# Patient Record
Sex: Female | Born: 1965 | Race: White | Hispanic: No | Smoking: Never smoker
Health system: Southern US, Community
[De-identification: ages and names within clinical notes are randomized; demographics above are authoritative.]

## PROBLEM LIST (undated history)

## (undated) DIAGNOSIS — R519 Headache, unspecified: Secondary | ICD-10-CM

## (undated) DIAGNOSIS — R51 Headache: Secondary | ICD-10-CM

## (undated) DIAGNOSIS — E559 Vitamin D deficiency, unspecified: Secondary | ICD-10-CM

## (undated) DIAGNOSIS — D649 Anemia, unspecified: Secondary | ICD-10-CM

## (undated) DIAGNOSIS — I82409 Acute embolism and thrombosis of unspecified deep veins of unspecified lower extremity: Secondary | ICD-10-CM

## (undated) DIAGNOSIS — R87629 Unspecified abnormal cytological findings in specimens from vagina: Secondary | ICD-10-CM

## (undated) DIAGNOSIS — F329 Major depressive disorder, single episode, unspecified: Secondary | ICD-10-CM

## (undated) DIAGNOSIS — Z87442 Personal history of urinary calculi: Secondary | ICD-10-CM

## (undated) DIAGNOSIS — J45909 Unspecified asthma, uncomplicated: Secondary | ICD-10-CM

## (undated) DIAGNOSIS — F32A Depression, unspecified: Secondary | ICD-10-CM

## (undated) DIAGNOSIS — K219 Gastro-esophageal reflux disease without esophagitis: Secondary | ICD-10-CM

## (undated) HISTORY — PX: OVARY SURGERY: SHX727

## (undated) HISTORY — PX: COLONOSCOPY: SHX174

## (undated) HISTORY — PX: CHOLECYSTECTOMY: SHX55

## (undated) HISTORY — PX: KIDNEY STONE SURGERY: SHX686

## (undated) HISTORY — PX: POSTERIOR FUSION CERVICAL SPINE: SUR628

## (undated) HISTORY — PX: TONSILLECTOMY: SUR1361

## (undated) HISTORY — PX: COLPOSCOPY: SHX161

---

## 1999-01-10 ENCOUNTER — Other Ambulatory Visit: Admission: RE | Admit: 1999-01-10 | Discharge: 1999-01-10 | Payer: Self-pay | Admitting: Obstetrics and Gynecology

## 1999-01-13 ENCOUNTER — Observation Stay (HOSPITAL_COMMUNITY): Admission: AD | Admit: 1999-01-13 | Discharge: 1999-01-14 | Payer: Self-pay | Admitting: Obstetrics and Gynecology

## 1999-04-29 ENCOUNTER — Inpatient Hospital Stay (HOSPITAL_COMMUNITY): Admission: AD | Admit: 1999-04-29 | Discharge: 1999-04-29 | Payer: Self-pay | Admitting: *Deleted

## 1999-08-01 ENCOUNTER — Inpatient Hospital Stay (HOSPITAL_COMMUNITY): Admission: AD | Admit: 1999-08-01 | Discharge: 1999-08-01 | Payer: Self-pay | Admitting: Obstetrics and Gynecology

## 1999-08-08 ENCOUNTER — Inpatient Hospital Stay (HOSPITAL_COMMUNITY): Admission: AD | Admit: 1999-08-08 | Discharge: 1999-08-10 | Payer: Self-pay | Admitting: Obstetrics and Gynecology

## 1999-09-10 ENCOUNTER — Other Ambulatory Visit: Admission: RE | Admit: 1999-09-10 | Discharge: 1999-09-10 | Payer: Self-pay | Admitting: Obstetrics and Gynecology

## 1999-10-01 ENCOUNTER — Encounter: Payer: Self-pay | Admitting: Emergency Medicine

## 1999-10-01 ENCOUNTER — Emergency Department (HOSPITAL_COMMUNITY): Admission: EM | Admit: 1999-10-01 | Discharge: 1999-10-02 | Payer: Self-pay | Admitting: Emergency Medicine

## 2000-05-29 ENCOUNTER — Emergency Department (HOSPITAL_COMMUNITY): Admission: EM | Admit: 2000-05-29 | Discharge: 2000-05-29 | Payer: Self-pay | Admitting: Emergency Medicine

## 2000-08-18 ENCOUNTER — Other Ambulatory Visit (HOSPITAL_COMMUNITY): Admission: RE | Admit: 2000-08-18 | Discharge: 2000-08-20 | Payer: Self-pay | Admitting: Psychiatry

## 2000-08-20 ENCOUNTER — Inpatient Hospital Stay (HOSPITAL_COMMUNITY): Admission: EM | Admit: 2000-08-20 | Discharge: 2000-08-31 | Payer: Self-pay | Admitting: Psychiatry

## 2000-10-14 ENCOUNTER — Inpatient Hospital Stay (HOSPITAL_COMMUNITY): Admission: EM | Admit: 2000-10-14 | Discharge: 2000-10-15 | Payer: Self-pay | Admitting: *Deleted

## 2001-12-14 ENCOUNTER — Ambulatory Visit (HOSPITAL_COMMUNITY): Admission: RE | Admit: 2001-12-14 | Discharge: 2001-12-14 | Payer: Self-pay | Admitting: Obstetrics & Gynecology

## 2002-04-08 ENCOUNTER — Emergency Department (HOSPITAL_COMMUNITY): Admission: EM | Admit: 2002-04-08 | Discharge: 2002-04-08 | Payer: Self-pay | Admitting: Emergency Medicine

## 2002-07-07 ENCOUNTER — Inpatient Hospital Stay (HOSPITAL_COMMUNITY): Admission: EM | Admit: 2002-07-07 | Discharge: 2002-07-08 | Payer: Self-pay | Admitting: Psychiatry

## 2003-03-30 ENCOUNTER — Emergency Department (HOSPITAL_COMMUNITY): Admission: EM | Admit: 2003-03-30 | Discharge: 2003-03-31 | Payer: Self-pay | Admitting: Emergency Medicine

## 2003-03-30 ENCOUNTER — Encounter: Payer: Self-pay | Admitting: Emergency Medicine

## 2003-03-31 ENCOUNTER — Encounter: Payer: Self-pay | Admitting: Emergency Medicine

## 2003-05-09 ENCOUNTER — Ambulatory Visit (HOSPITAL_COMMUNITY): Admission: RE | Admit: 2003-05-09 | Discharge: 2003-05-09 | Payer: Self-pay | Admitting: *Deleted

## 2003-09-03 ENCOUNTER — Emergency Department (HOSPITAL_COMMUNITY): Admission: EM | Admit: 2003-09-03 | Discharge: 2003-09-03 | Payer: Self-pay | Admitting: Emergency Medicine

## 2003-10-16 ENCOUNTER — Emergency Department (HOSPITAL_COMMUNITY): Admission: EM | Admit: 2003-10-16 | Discharge: 2003-10-16 | Payer: Self-pay | Admitting: Emergency Medicine

## 2003-10-25 ENCOUNTER — Encounter: Admission: RE | Admit: 2003-10-25 | Discharge: 2003-10-25 | Payer: Self-pay | Admitting: Psychiatry

## 2013-01-05 DIAGNOSIS — F332 Major depressive disorder, recurrent severe without psychotic features: Secondary | ICD-10-CM

## 2013-01-05 HISTORY — DX: Major depressive disorder, recurrent severe without psychotic features: F33.2

## 2013-11-28 ENCOUNTER — Emergency Department (HOSPITAL_COMMUNITY): Payer: BC Managed Care – PPO

## 2013-11-28 ENCOUNTER — Encounter (HOSPITAL_COMMUNITY): Payer: Self-pay | Admitting: Emergency Medicine

## 2013-11-28 ENCOUNTER — Emergency Department (HOSPITAL_COMMUNITY)
Admission: EM | Admit: 2013-11-28 | Discharge: 2013-11-28 | Disposition: A | Discharge status: Home / Self Care | Payer: BC Managed Care – PPO | Attending: Emergency Medicine | Admitting: Emergency Medicine

## 2013-11-28 DIAGNOSIS — R110 Nausea: Secondary | ICD-10-CM | POA: Insufficient documentation

## 2013-11-28 DIAGNOSIS — F3289 Other specified depressive episodes: Secondary | ICD-10-CM | POA: Insufficient documentation

## 2013-11-28 DIAGNOSIS — Z87442 Personal history of urinary calculi: Secondary | ICD-10-CM | POA: Insufficient documentation

## 2013-11-28 DIAGNOSIS — R109 Unspecified abdominal pain: Secondary | ICD-10-CM | POA: Insufficient documentation

## 2013-11-28 DIAGNOSIS — R300 Dysuria: Secondary | ICD-10-CM

## 2013-11-28 DIAGNOSIS — R51 Headache: Secondary | ICD-10-CM | POA: Insufficient documentation

## 2013-11-28 DIAGNOSIS — Z3202 Encounter for pregnancy test, result negative: Secondary | ICD-10-CM | POA: Insufficient documentation

## 2013-11-28 DIAGNOSIS — R350 Frequency of micturition: Secondary | ICD-10-CM | POA: Insufficient documentation

## 2013-11-28 DIAGNOSIS — Z79899 Other long term (current) drug therapy: Secondary | ICD-10-CM | POA: Insufficient documentation

## 2013-11-28 DIAGNOSIS — F17200 Nicotine dependence, unspecified, uncomplicated: Secondary | ICD-10-CM | POA: Insufficient documentation

## 2013-11-28 DIAGNOSIS — Z880 Allergy status to penicillin: Secondary | ICD-10-CM | POA: Insufficient documentation

## 2013-11-28 DIAGNOSIS — R10A0 Flank pain, unspecified side: Secondary | ICD-10-CM

## 2013-11-28 DIAGNOSIS — R6883 Chills (without fever): Secondary | ICD-10-CM | POA: Insufficient documentation

## 2013-11-28 DIAGNOSIS — F329 Major depressive disorder, single episode, unspecified: Secondary | ICD-10-CM | POA: Insufficient documentation

## 2013-11-28 HISTORY — DX: Depression, unspecified: F32.A

## 2013-11-28 HISTORY — DX: Major depressive disorder, single episode, unspecified: F32.9

## 2013-11-28 LAB — CBC
HCT: 40.1 % (ref 36.0–46.0)
Hemoglobin: 13.4 g/dL (ref 12.0–15.0)
MCH: 30.3 pg (ref 26.0–34.0)
MCHC: 33.4 g/dL (ref 30.0–36.0)
MCV: 90.7 fL (ref 78.0–100.0)
Platelets: 439 10*3/uL — ABNORMAL HIGH (ref 150–400)
RBC: 4.42 MIL/uL (ref 3.87–5.11)
RDW: 14.1 % (ref 11.5–15.5)
WBC: 13.6 10*3/uL — ABNORMAL HIGH (ref 4.0–10.5)

## 2013-11-28 LAB — BASIC METABOLIC PANEL
BUN: 18 mg/dL (ref 6–23)
CO2: 22 mEq/L (ref 19–32)
Calcium: 9.8 mg/dL (ref 8.4–10.5)
Chloride: 101 mEq/L (ref 96–112)
Creatinine, Ser: 0.64 mg/dL (ref 0.50–1.10)
GFR calc Af Amer: >90 mL/min (ref >90)
GFR calc non Af Amer: >90 mL/min (ref >90)
Glucose, Bld: 95 mg/dL (ref 70–99)
Potassium: 4.1 mEq/L (ref 3.7–5.3)
Sodium: 137 mEq/L (ref 137–147)

## 2013-11-28 LAB — URINALYSIS, ROUTINE W REFLEX MICROSCOPIC
Bilirubin Urine: NEGATIVE
Glucose, UA: NEGATIVE mg/dL
Hgb urine dipstick: NEGATIVE
Ketones, ur: NEGATIVE mg/dL
Leukocytes, UA: NEGATIVE
Nitrite: NEGATIVE
Protein, ur: NEGATIVE mg/dL
Specific Gravity, Urine: 1.028 (ref 1.005–1.030)
Urobilinogen, UA: 0.2 mg/dL (ref 0.0–1.0)
pH: 5.5 (ref 5.0–8.0)

## 2013-11-28 LAB — HEPATIC FUNCTION PANEL
ALT: 28 U/L (ref 0–35)
AST: 29 U/L (ref 0–37)
Albumin: 3.7 g/dL (ref 3.5–5.2)
Alkaline Phosphatase: 72 U/L (ref 39–117)
Bilirubin, Direct: <0.2 mg/dL (ref 0.0–0.3)
Total Bilirubin: <0.2 mg/dL — ABNORMAL LOW (ref 0.3–1.2)
Total Protein: 7.6 g/dL (ref 6.0–8.3)

## 2013-11-28 LAB — WET PREP, GENITAL
Clue Cells Wet Prep HPF POC: NONE SEEN
Trich, Wet Prep: NONE SEEN
WBC, Wet Prep HPF POC: NONE SEEN
Yeast Wet Prep HPF POC: NONE SEEN

## 2013-11-28 LAB — POC URINE PREG, ED: Preg Test, Ur: NEGATIVE

## 2013-11-28 IMAGING — US US PELVIS COMPLETE
1 series · 13 of 25 positions shown · non-contrast
Comparison: CT earlier today

CLINICAL DATA: Right lower quadrant pain.

EXAM:
TRANSABDOMINAL AND TRANSVAGINAL ULTRASOUND OF PELVIS
DOPPLER ULTRASOUND OF OVARIES
TECHNIQUE: Both transabdominal and transvaginal ultrasound examinations of the
pelvis were performed. Transabdominal technique was performed for
global imaging of the pelvis including uterus, ovaries, adnexal
regions, and pelvic cul-de-sac.
It was necessary to proceed with endovaginal exam following the
transabdominal exam to visualize the uterus, endometrium, ovaries
and adnexa . Color and duplex Doppler ultrasound was utilized to
evaluate blood flow to the ovaries.

[Series 1: us pelvis complete · 0.27mm/px · 13 of 85 slices shown]
[im 1/85]
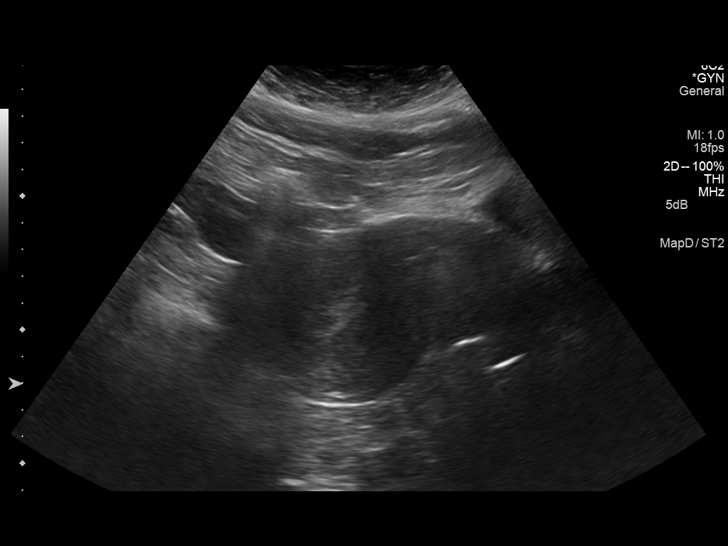
[im 8/85]
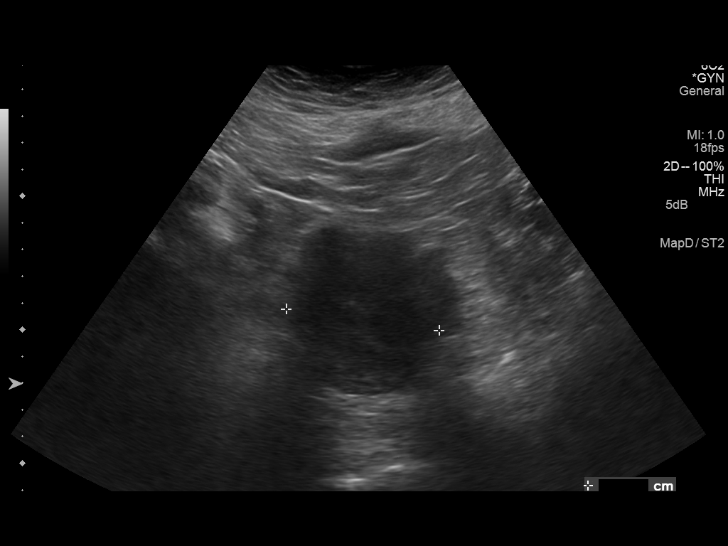
[im 15/85]
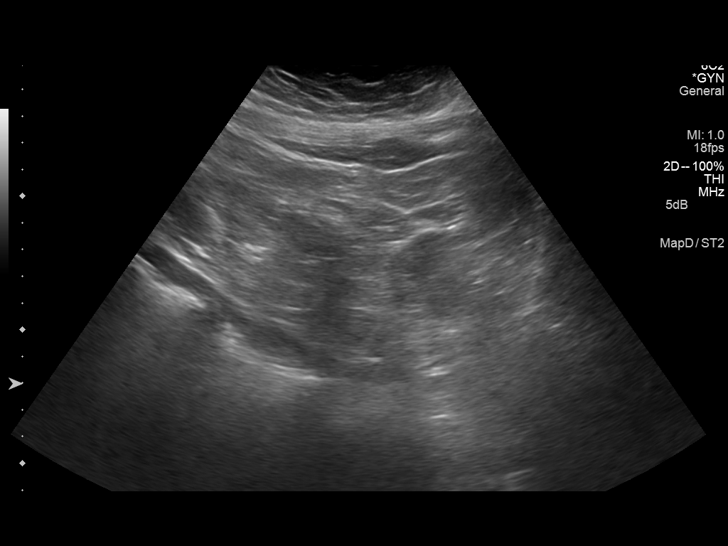
[im 22/85]
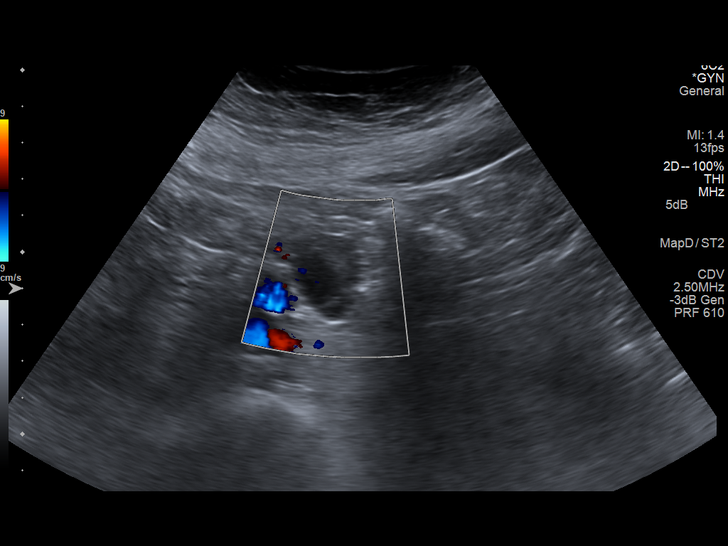
[im 29/85]
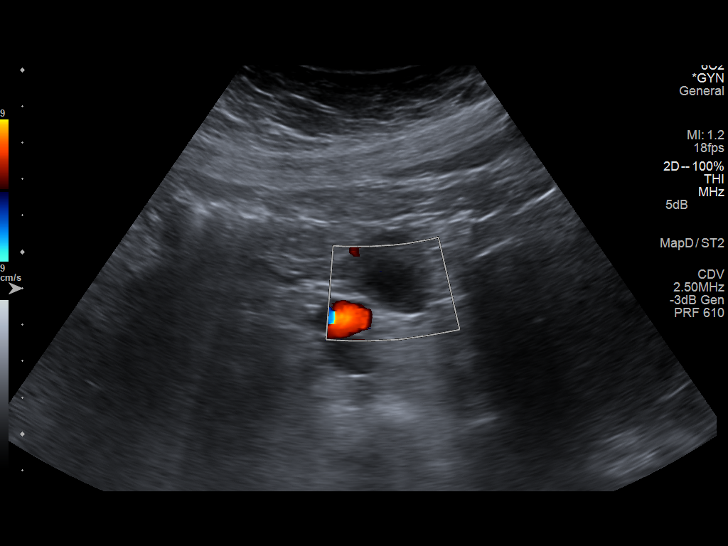
[im 36/85]
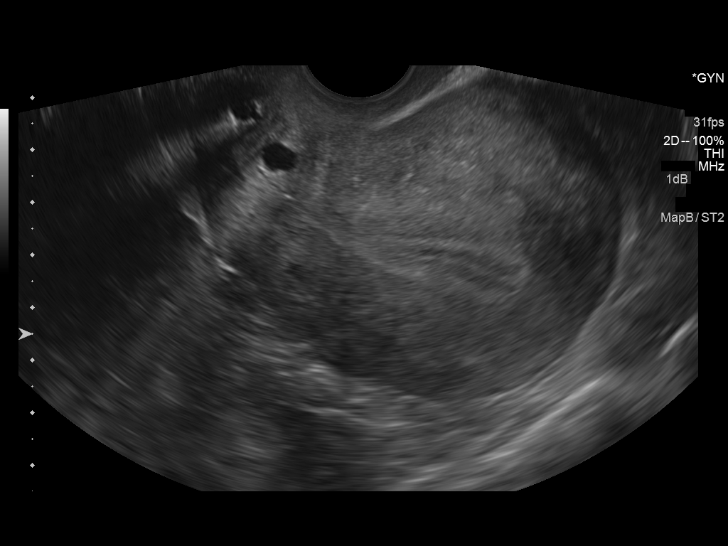
[im 43/85]
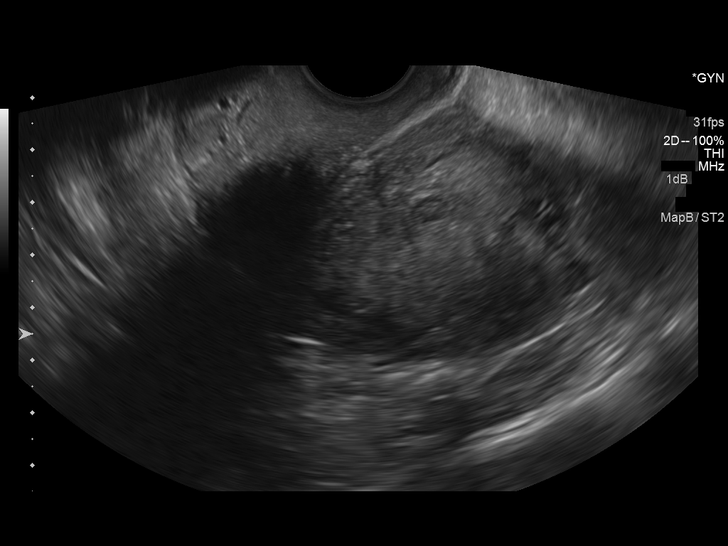
[im 50/85]
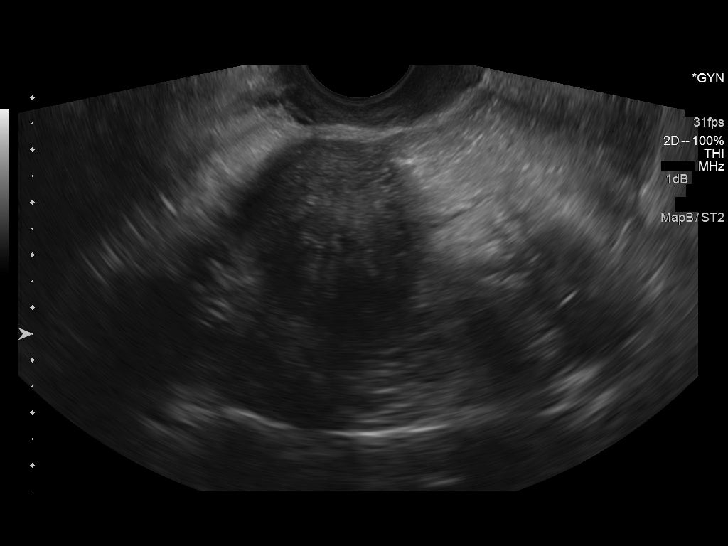
[im 57/85]
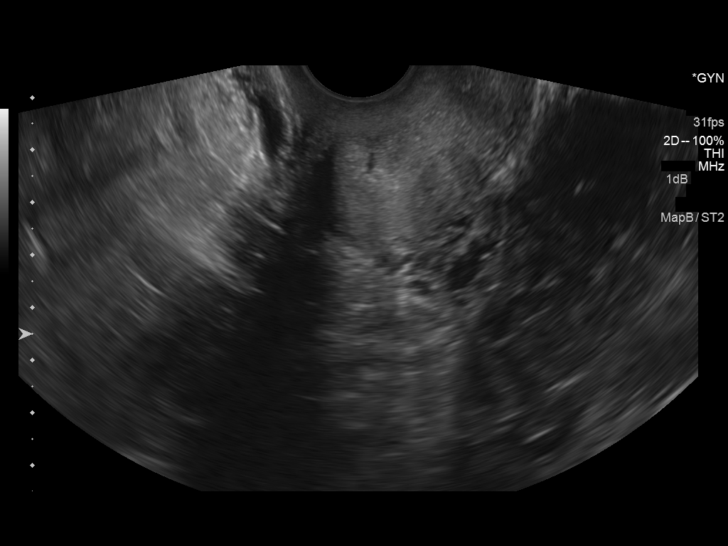
[im 64/85]
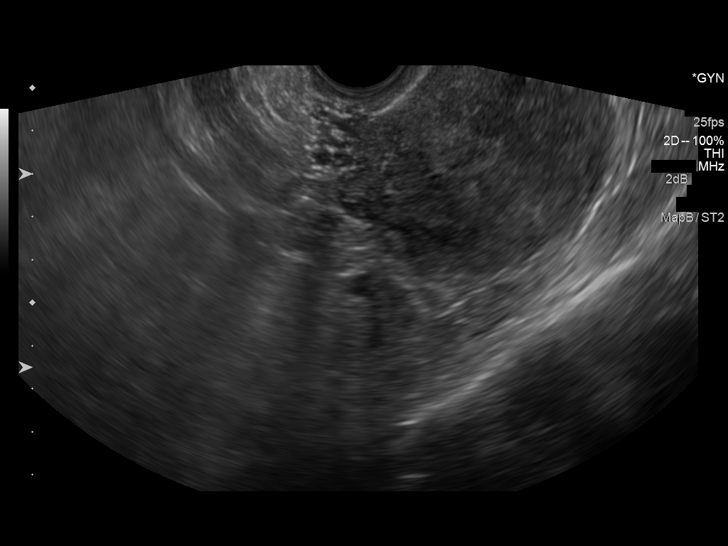
[im 71/85]
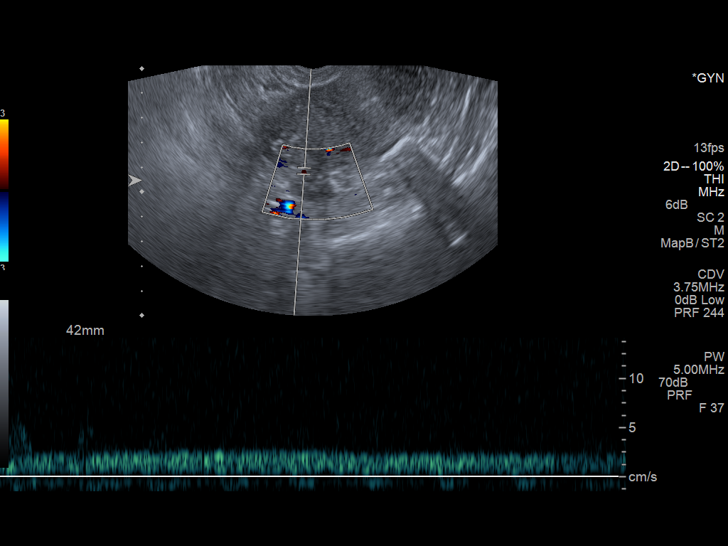
[im 78/85]
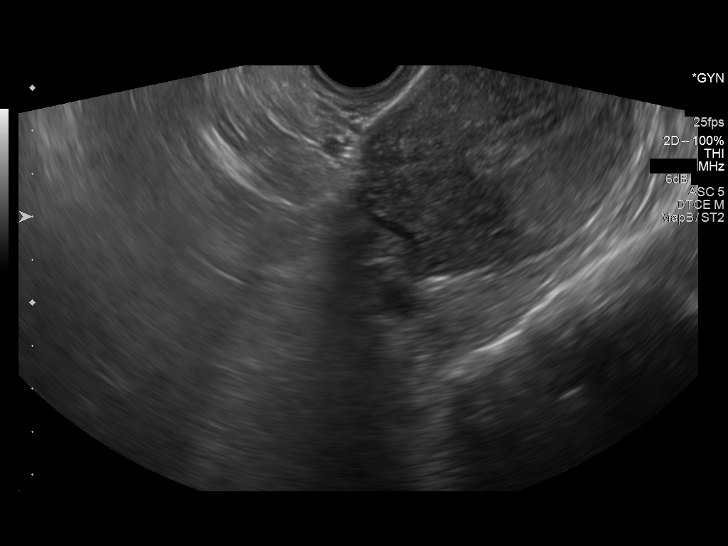
[im 85/85]
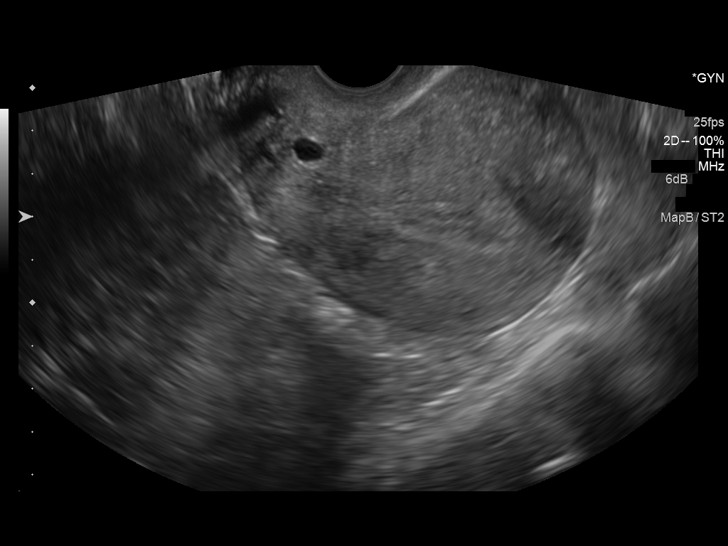

[13 of 25 positions shown; findings below may reference images not displayed]

FINDINGS: Uterus

Measurements: 8.7 x 6.3 x 6.6 cm. Uterus is retroverted. 8 mm
fibroid noted in the posterior wall (intramural).

Endometrium

Thickness: 11 mm in thickness.  No focal abnormality visualized.

Right ovary

Measurements: 3.2 x 2.0 x 2.0 cm. 1.8 cm follicle in the right
ovary. Normal appearance/no adnexal mass.

Left ovary

Measurements: Prior oophorectomy.

Pulsed Doppler evaluation of both ovaries demonstrates normal
low-resistance arterial and venous waveforms within the right ovary.

Other findings

No free fluid.
IMPRESSION: Prior left oophorectomy. Small intramural fibroid. Otherwise
unremarkable study.

## 2013-11-28 IMAGING — CT CT ABD-PELV W/O CM
1 series · 15 of 26 positions shown, 19 images · non-contrast
Comparison: None.

CLINICAL DATA: Right flank pain and urinary urgency for 2 days.
Elevated white cell count.

EXAM:
CT ABDOMEN AND PELVIS WITHOUT CONTRAST
TECHNIQUE: Multidetector CT imaging of the abdomen and pelvis was performed
following the standard protocol without IV contrast.

[Series 3: lung · axial · 0.74mm/px · z∈[-402,-286]mm · 15 of 26 slices shown, 19 images]
[im 2/26  soft-tissue]
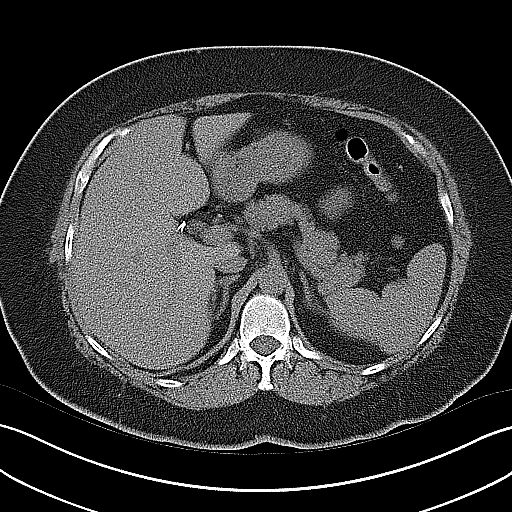
[im 2/26  bone]
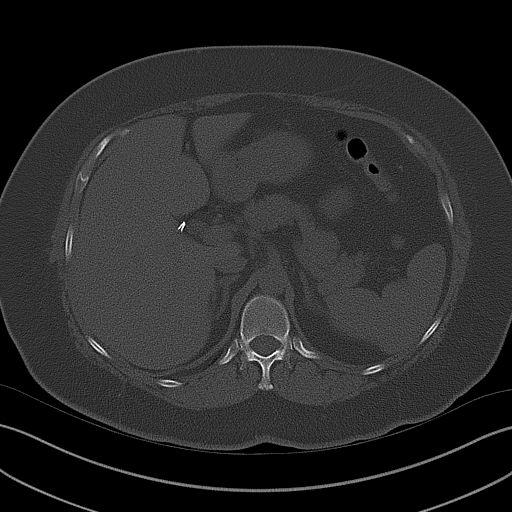
[im 4/26  soft-tissue]
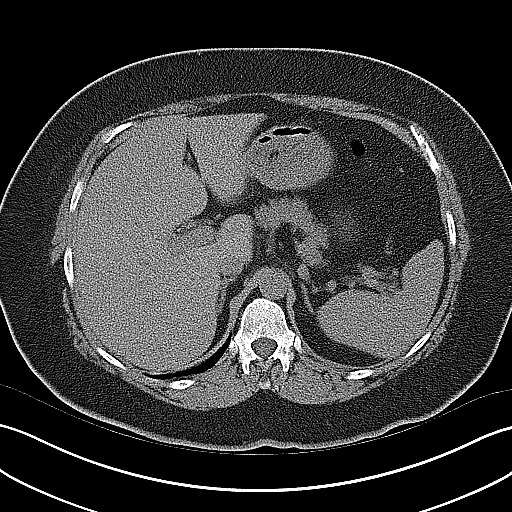
[im 6/26  soft-tissue]
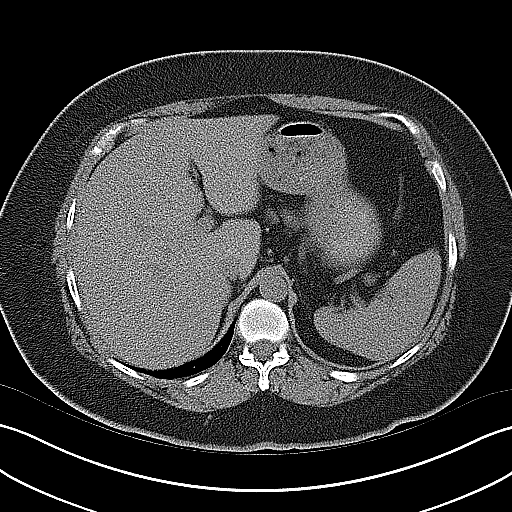
[im 8/26  soft-tissue]
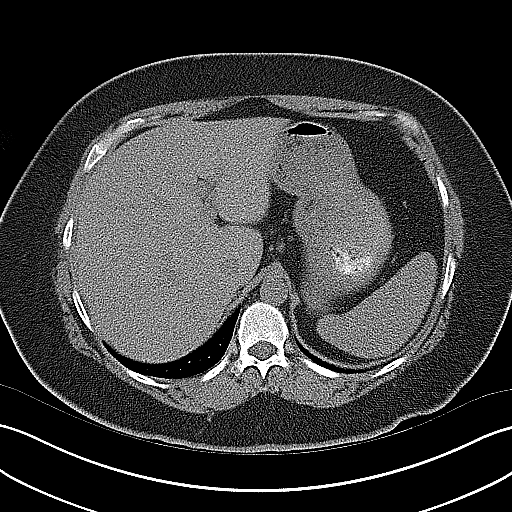
[im 10/26  soft-tissue]
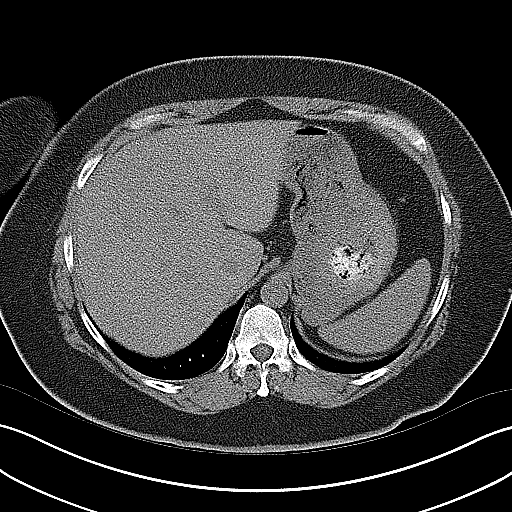
[im 12/26  soft-tissue]
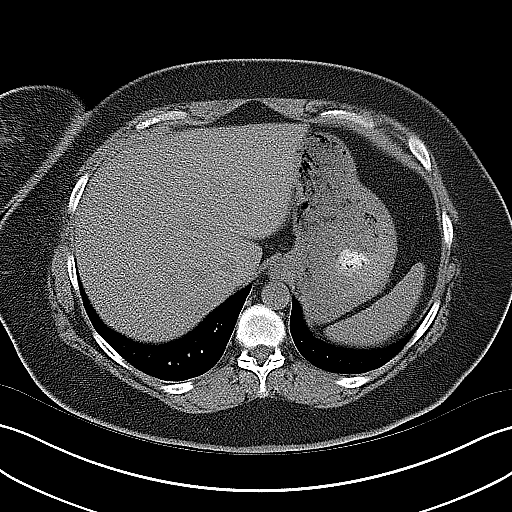
[im 14/26  soft-tissue]
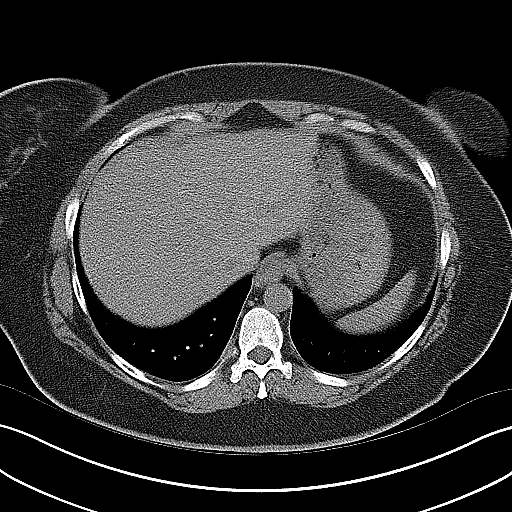
[im 15/26  soft-tissue]
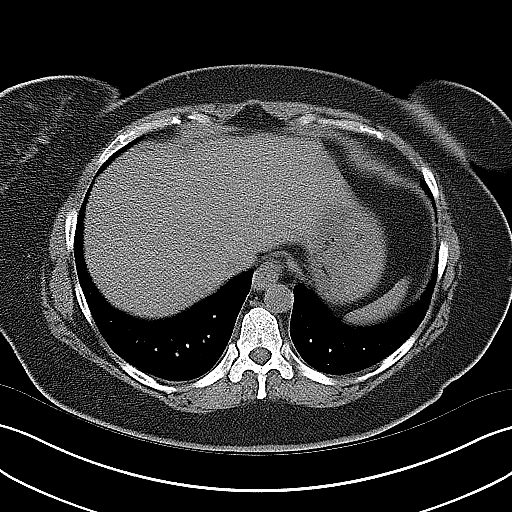
[im 17/26  soft-tissue]
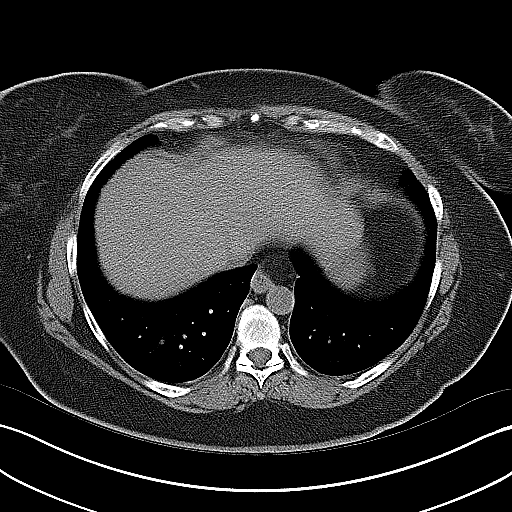
[im 17/26  bone]
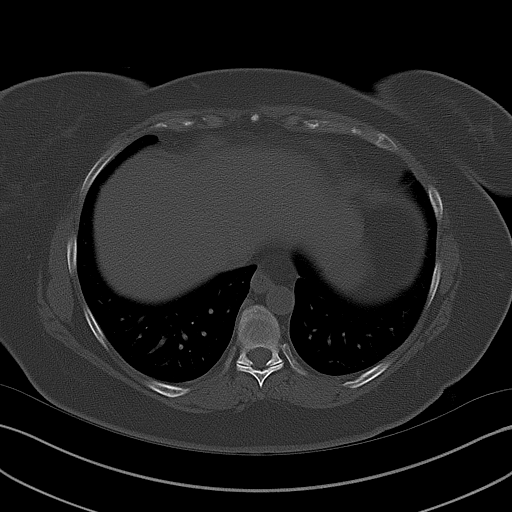
[im 19/26  soft-tissue]
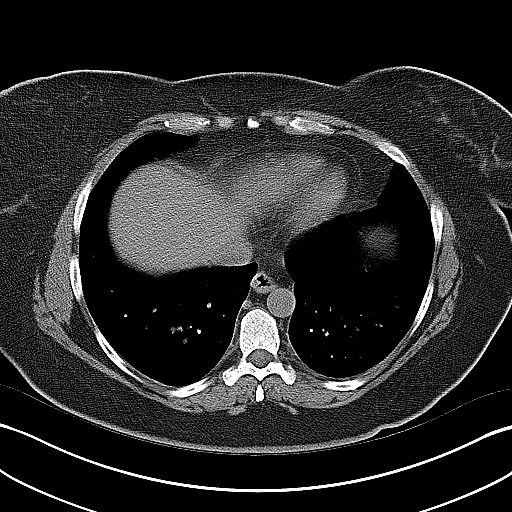
[im 21/26  soft-tissue]
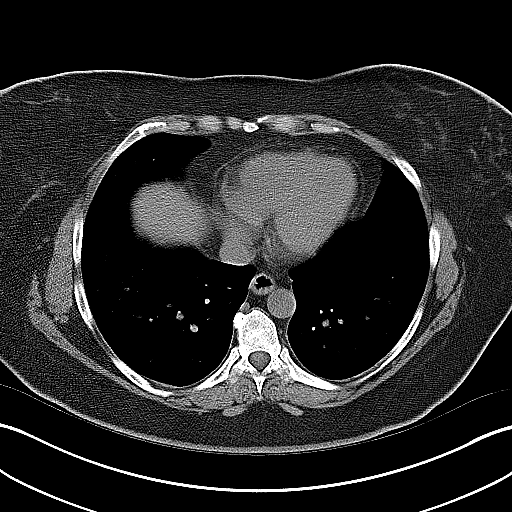
[im 22/26  lung]
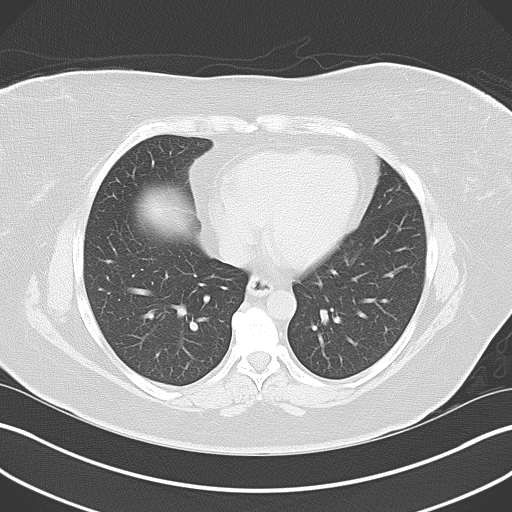
[im 23/26  soft-tissue]
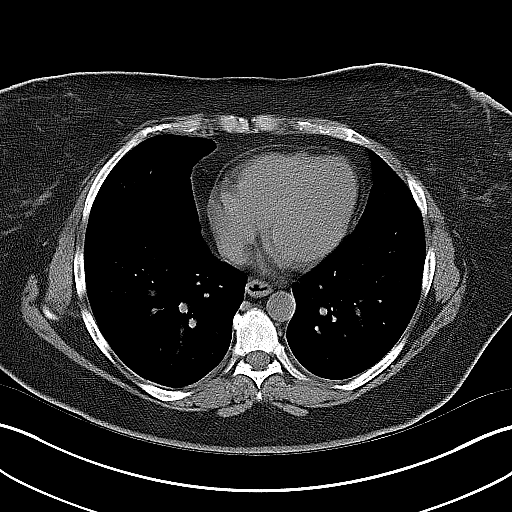
[im 23/26  lung]
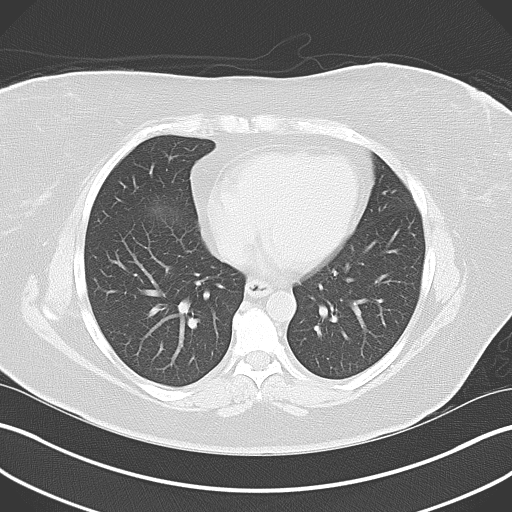
[im 24/26  lung]
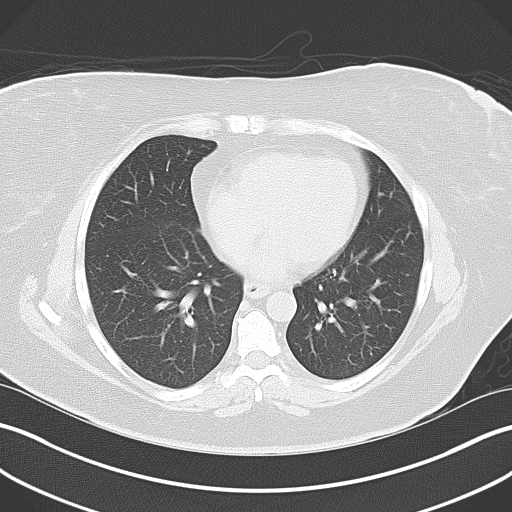
[im 25/26  soft-tissue]
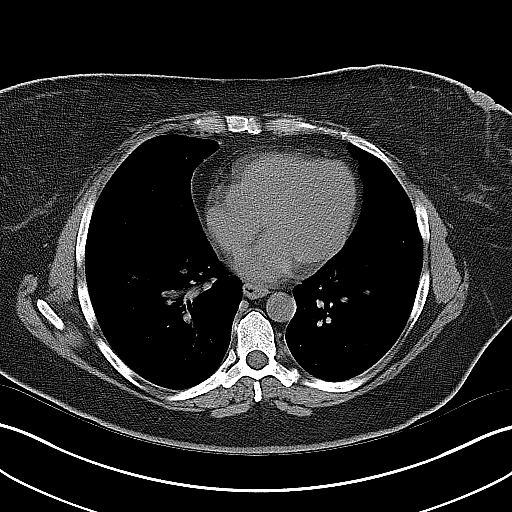
[im 25/26  lung]
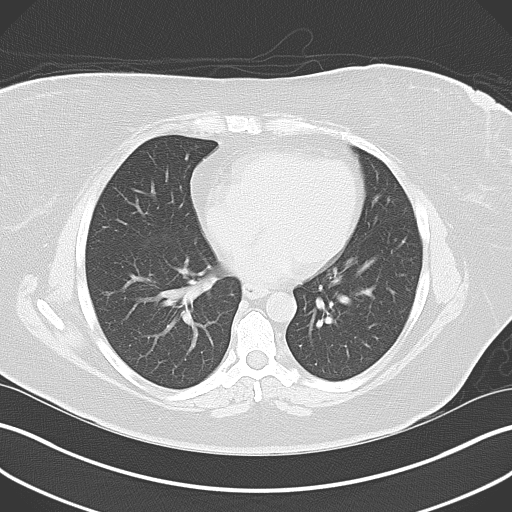

[15 of 26 positions shown; findings below may reference images not displayed]

FINDINGS: Lung bases are clear.

The kidneys appear symmetrical in size and shape. No
pyelocaliectasis or ureterectasis. No renal, ureteral, or bladder
stones are visualized.

Surgical absence of the gallbladder. The unenhanced appearance of
the liver, spleen, pancreas, adrenal glands, abdominal aorta,
inferior vena cava, and retroperitoneal lymph nodes is unremarkable.
The stomach and small bowel are decompressed. Stool-filled colon
without distention. No free air or free fluid in the abdomen. Small
accessory spleens. Minimal umbilical hernia containing fat.

Pelvis: Uterus and adnexal structures are not enlarged. No free or
loculated pelvic fluid collections. Bladder wall is not thickened.
Appendix is normal. Mild degenerative changes in the lumbar spine.
No destructive bone lesions. Normal
IMPRESSION: No renal or ureteral stone or obstruction. No acute process
demonstrated and on unenhanced views of the abdomen and pelvis.

## 2013-11-28 MED ORDER — FENTANYL CITRATE 0.05 MG/ML IJ SOLN
50.0000 ug | Freq: Once | INTRAMUSCULAR | Status: AC
  Administered 2013-11-28: 50 ug via INTRAVENOUS
  Filled 2013-11-28: qty 2

## 2013-11-28 MED ORDER — CIPROFLOXACIN HCL 500 MG PO TABS: 500.0000 mg | ORAL_TABLET | Freq: Two times a day (BID) | ORAL | Status: DC

## 2013-11-28 MED ORDER — MORPHINE SULFATE 4 MG/ML IJ SOLN
4.0000 mg | Freq: Once | INTRAMUSCULAR | Status: AC
  Administered 2013-11-28: 4 mg via INTRAVENOUS
  Filled 2013-11-28: qty 1

## 2013-11-28 MED ORDER — METOCLOPRAMIDE HCL 5 MG/ML IJ SOLN
5.0000 mg | Freq: Once | INTRAMUSCULAR | Status: AC
  Administered 2013-11-28: 5 mg via INTRAVENOUS
  Filled 2013-11-28: qty 2

## 2013-11-28 MED ORDER — HYDROCODONE-ACETAMINOPHEN 5-325 MG PO TABS: 1.0000 | ORAL_TABLET | ORAL | Status: DC | PRN

## 2013-11-28 NOTE — Discharge Instructions (Signed)
Abdominal Pain, Adult Many things can cause abdominal pain. Usually, abdominal pain is not caused by a disease and will improve without treatment. It can often be observed and treated at home. Your health care provider will do a physical exam and possibly order blood tests and X-rays to help determine the seriousness of your pain. However, in many cases, more time must pass before a clear cause of the pain can be found. Before that point, your health care provider may not know if you need more testing or further treatment. HOME CARE INSTRUCTIONS  Monitor your abdominal pain for any changes. The following actions may help to alleviate any discomfort you are experiencing:  Only take over-the-counter or prescription medicines as directed by your health care provider.  Do not take laxatives unless directed to do so by your health care provider.  Try a clear liquid diet (broth, tea, or water) as directed by your health care provider. Slowly move to a bland diet as tolerated. SEEK MEDICAL CARE IF:  You have unexplained abdominal pain.  You have abdominal pain associated with nausea or diarrhea.  You have pain when you urinate or have a bowel movement.  You experience abdominal pain that wakes you in the night.  You have abdominal pain that is worsened or improved by eating food.  You have abdominal pain that is worsened with eating fatty foods. SEEK IMMEDIATE MEDICAL CARE IF:   Your pain does not go away within 2 hours.  You have a fever.  You keep throwing up (vomiting).  Your pain is felt only in portions of the abdomen, such as the right side or the left lower portion of the abdomen.  You pass bloody or black tarry stools. MAKE SURE YOU:  Understand these instructions.   Will watch your condition.   Will get help right away if you are not doing well or get worse.  Document Released: 04/15/2005 Document Revised: 04/26/2013 Document Reviewed: 03/15/2013 [facility] Patient  Information 2014 [place]. Flank Pain Flank pain refers to pain that is located on the side of the body between the upper abdomen and the back. The pain may occur over a short period of time (acute) or may be long-term or reoccurring (chronic). It may be mild or severe. Flank pain can be caused by many things. CAUSES  Some of the more common causes of flank pain include:  Muscle strains.   Muscle spasms.   A disease of your spine (vertebral disk disease).   A lung infection (pneumonia).   Fluid around your lungs (pulmonary edema).   A kidney infection.   Kidney stones.   A very painful skin rash caused by the chickenpox virus (shingles).   Gallbladder disease.  [place] care will depend on the cause of your pain. In general,  Rest as directed by your caregiver.  Drink enough fluids to keep your urine clear or pale yellow.  Only take over-the-counter or prescription medicines as directed by your caregiver. Some medicines may help relieve the pain.  Tell your caregiver about any changes in your pain.  Follow up with your caregiver as directed. SEEK IMMEDIATE MEDICAL CARE IF:   Your pain is not controlled with medicine.   You have new or worsening symptoms.  Your pain increases.   You have abdominal pain.   You have shortness of breath.   You have persistent nausea or vomiting.   You have swelling in your abdomen.   You feel faint or  pass out.   You have blood in your urine.  You have a fever or persistent symptoms for more than 2 3 days.  You have a fever and your symptoms suddenly get worse. MAKE SURE YOU:   Understand these instructions.  Will watch your condition.  Will get help right away if you are not doing well or get worse. Document Released: 08/27/2005 Document Revised: 03/30/2012 Document Reviewed: 02/18/2012 [facility] Patient Information 2014 [place].  Emergency Department Resource Guide 1)  Find a Doctor and Pay Out of Pocket Although you won't have to find out who is covered by your insurance plan, it is a good idea to ask around and get recommendations. You will then need to call the office and see if the doctor you have chosen will accept you as a new patient and what types of options they offer for patients who are self-pay. Some doctors offer discounts or will set up payment plans for their patients who do not have insurance, but you will need to ask so you aren't surprised when you get to your appointment.  2) Contact Your Local Health Department Not all health departments have doctors that can see patients for sick visits, but many do, so it is worth a call to see if yours does. If you don't know where your local health department is, you can check in your phone book. The CDC also has a tool to help you locate your state's health department, and many state websites also have listings of all of their local health departments.  3) Find a [facility] If your illness is not likely to be very severe or complicated, you may want to try a walk in clinic. These are popping up all over the country in pharmacies, drugstores, and shopping centers. They're usually staffed by nurse practitioners or physician assistants that have been trained to treat common illnesses and complaints. They're usually fairly quick and inexpensive. However, if you have serious medical issues or chronic medical problems, these are probably not your best option.  No Primary Care Doctor: - Call Health Connect at  [phone] - they can help you locate a primary care doctor that  accepts your insurance, provides certain services, etc. - Physician Referral Service- [phone]  Chronic Pain Problems: Organization         Address  Phone   Notes  [facility]  [phone] Patients need to be referred by their primary care doctor.   Medication Assistance: Organization         Address  Phone    Notes  [facility] Medication [facility] [address] [place], [place] [address] [phone] --Must be a resident of [facility] -- Must have NO insurance coverage whatsoever (no Medicaid/ Medicare, etc.) -- The pt. MUST have a primary care doctor that directs their care regularly and follows them in the community   Med[facility]ist  [phone]   [employer]  [phone]    Agencies that provide inexpensive medical care: Organization         Address  Phone   Notes  [place]  [phone]   [facility]    [phone]   [facility] [place], [place] [address] [phone]   [employer] [address] [place] [phone]   Planned Parenthood    [phone]   [facility]    [phone]   [address], [place] Phone:  [phone], Fax:  [phone] Hours of Operation:  9 am - 6 pm, M-F.  Also accepts Medicaid/Medicare and self-pay.  [facility] for [place] [address], [address], [place] Phone: [phone], Fax: [phone]. Hours of Operation:  8:30 am - 5:30 pm, M-F.  Also accepts Medicaid and self-pay.  [facility] [place] [address] [place] Phone: [phone]   [place], [place], [place] [phone], Ext. [phone] Mondays & Thursdays: 7-9 AM.  First 15 patients are seen on a first come, first serve basis.    [place] Providers:  Organization         Address  Phone   Notes  [facility] (1-Rh) [address], [place] [phone] Also accepts self-pay patients.  [facility] [address], [place]  [phone]   [place], [address], [place] [phone]   [facility] [address] [place] [phone]   Lucianne Lei [address], [place]   [phone] Only accepts [place] Access [place] patients after they have their name applied to their card.   Self-Pay (no insurance) in [facility]:  Organization         Address  Phone   Notes  Sickle Cell Patients, [facility] [place] [phone]   [facility] [place] [phone]   [facility] [place]  [place], [place], [place] [phone]   [facility]/Dr. Osei-Bonsu  [address], [place] or [address], [place] [phone] Phone number for both [place] and [place] locations is the same.  Urgent Medical and [facility] Main [address], [place] [phone]   [facility] [address] [place] or [address] [phone]   [facility] [address] [place] [phone], phone; [phone], fax Sees patients 1st and 3rd Saturday of every month.  Must not qualify for public or private insurance (i.e. Medicaid, Medicare, [place] Health Choice, Veterans' Benefits)  Household income should be no more than 200% of the poverty level The clinic cannot treat you if you are pregnant or think you are pregnant  Sexually transmitted diseases are not treated at the clinic.    Dental Care: Organization         Address  Phone  Notes  [facility] Department of [facility] [place] [phone] Accepts children up to age 74 who are enrolled in [place] or [place]; pregnant women with a Medicaid card; and children who have applied for Medicaid or [place] Health Choice, but were declined, whose parents can pay a reduced fee at time of service.  [facility] Department of [facility]  [address], [place] [phone] Accepts children up to age 53 who are enrolled in [place] or [place]; pregnant women with a Medicaid card; and children who have applied for Medicaid or [place] Health Choice, but were declined, whose parents can pay a reduced fee at time of service.  [place]  Access PROGRAM  [place] [phone] Patients are seen by appointment only. Walk-ins are not accepted. [place] will see patients 77 years of age and older. Monday - Tuesday (8am-5pm) Most Wednesdays (8:30-5pm) $30 per visit, cash only  [facility] Adult Dental Access PROGRAM  [address], [facility] [phone] Patients are seen by appointment only. Walk-ins are not accepted. [place] will see patients 67 years of age and older. One Wednesday Evening (Monthly: Volunteer Based).  $30 per visit, cash only  [place]  [phone] for adults; Children under age 32, call Graduate Pediatric Dentistry at [phone]. Children aged 44-14, please call [phone] to request a pediatric application.  Dental services are provided in all areas of dental care including fillings, crowns and bridges, complete and partial dentures, implants, gum treatment, root canals, and extractions. Preventive care is also provided. Treatment is provided to both adults and children. Patients are selected via a lottery and there is often a waiting list.   [facility] [address], [place]  [phone] [email or web address]   [facility] [address] [place], [place] [phone], Ext. [phone] Second and Fourth Thursday of each month, opens at 6:30 AM; Clinic ends at 9 AM.  Patients are seen on a first-come first-served basis, and a limited number are seen during each clinic.   [facility]  [address] Hillard Danker [place], [place] [phone]   Eligibility Requirements You must have lived in  [place], [place], or [place] counties for at least the last three months.   You cannot be eligible for state or federal sponsored [employer], including [employer], [place], or [employer].   You generally cannot be eligible for healthcare insurance through your employer.    How to apply: Eligibility screenings are held every Tuesday and Wednesday afternoon from 1:00 pm until 4:00 pm. You do not need an appointment for the interview!  [facility] [address], [place], [place]   [place]  [place] Department  [place]  [phone]    Behavioral Health Resources in the Community: Intensive Outpatient Programs Organization         Address  Phone  Notes  [place]. [address], [place], [place] [phone]   [facility] Outpatient [address] [place], [place]   ADS: Alcohol & Drug Svcs [address] [place], [place]   [place] [address]  [place], [place] or [phone]   Substance Abuse Resources Organization         Address  Phone  Notes  Alcohol and Drug Services  [phone]   [place]  [phone]   [facility] [place]   Chinita Pester  [phone]   Residential & Outpatient Substance Abuse Program  [phone]   Psychological Services Organization         Address  Phone  Notes  [facility] [place]  [place]  [phone]   [place] [address], [place] or [phone]    Mobile Crisis Teams Organization         Address  Phone  Notes  [facility], Mobile Crisis Care Unit  [phone]   [facility]  [address] [place], [place]  [phone]   [facility] De[facility]sch [address] [place] [phone]    Self-Help/Support Groups Organization         Address  Phone             Notes  [facility] of [place] - variety of support groups  [phone] Call for more information  Narcotics Anonymous (NA), [facility] [address], [place]  2 meetings at this location   Statisticianesidential Treatment Programs Organization         Address  Phone  Notes  [facility] [address],    [place]  [phone]   [facility]  [address], [place] [phone], [place], [place] [phone]   [facility] [address], [place] [phone] Admissions: 8am-3pm M-F  [facility] [address]    [place], [place] [phone]   [facility] [address], [place], [place] [phone]   [facility] [address]  [place], [place] [phone]   [facility] - Intensive Outpatient [address], [place], [place] [phone]   [facility] (Addiction Recovery Care Assoc[facility] [address]  [place], [place] [phone] or [phone]   [facility] (RTS) [address] [place], [place] [phone] Accepts Medicaid  [facility] [address]  [place] [phone] Substance Abuse/Addiction Treatment   [facility] Behavioral Health Resources Organization         Address  Phone  Notes  [facility]  [phone]   Angie FavaJulie Brannon, PhD [address], [address] [place], [place]   [phone](336) [phone] or [phone]   [facility]   [address] [place], [place] [phone]   [facility] [address], [place], [place] [phone](336) [phone] Insurance/Medicaid/sponsorship through [facility]  Faith and Families [address]                                    [place], [place] [phone](336) [phone] Therapy/tele-psych/case  [facility] [address]   [place], [place] [phone](336)  [phone]    Dr. Lolly MustacheArfeen  [phone](336) [phone]   [facility]  [employer] [facility] 1) [address], [place] 2) [address], [place] 3)  [address], [place] [phone](336) [phone]) [phone]  [phone]   [facility] [place] Child Abuse Hotline [phone](336) [phone] or [phone]) [phone] (After Hours)      \

## 2013-11-28 NOTE — ED Notes (Signed)
Pt complains of right sided pain and urgency for two days, hx of kidney stones and pain is the same

## 2013-11-28 NOTE — ED Provider Notes (Signed)
Patient signed out to me by [place], PA-C.  Casey Jordan is a 48 year old female with past medical history of depression presenting to the ED with right-sided flank pain that started on Friday his progress the got worse. Stated that the discomfort is localized to the right side-described as aching, pressure sensation with radiation to his right lower quadrant of her abdomen. Stated that she's been having increased frequency-reported pressure in her bladder when she goes to the bathroom. Stated that she has history of kidney stones, diagnosed approximately 4 years ago. Stated that she's been having intermittent hot and cold sensations with intermittent dizziness associated with this. Stated that she's been feeling nauseous without episodes of emesis. Denied diarrhea, abdominal pain, hematuria, melena, hematochezia, syncopal episode.   Results for orders placed during the hospital encounter of 11/28/13  URINALYSIS, ROUTINE W REFLEX MICROSCOPIC      Result Value Ref Range   Color, Urine YELLOW  YELLOW   APPearance CLEAR  CLEAR   Specific Gravity, Urine 1.028  1.005 - 1.030   pH 5.5  5.0 - 8.0   Glucose, UA NEGATIVE  NEGATIVE mg/dL   Hgb urine dipstick NEGATIVE  NEGATIVE   Bilirubin Urine NEGATIVE  NEGATIVE   Ketones, ur NEGATIVE  NEGATIVE mg/dL   Protein, ur NEGATIVE  NEGATIVE mg/dL   Urobilinogen, UA 0.2  0.0 - 1.0 mg/dL   Nitrite NEGATIVE  NEGATIVE   Leukocytes, UA NEGATIVE  NEGATIVE  CBC      Result Value Ref Range   WBC 13.6 (*) 4.0 - 10.5 K/uL   RBC 4.42  3.87 - 5.11 MIL/uL   Hemoglobin 13.4  12.0 - 15.0 g/dL   HCT 40.1  36.0 - 46.0 %   MCV 90.7  78.0 - 100.0 fL   MCH 30.3  26.0 - 34.0 pg   MCHC 33.4  30.0 - 36.0 g/dL   RDW 14.1  11.5 - 15.5 %   Platelets 439 (*) 150 - 400 K/uL  BASIC METABOLIC PANEL      Result Value Ref Range   Sodium 137  137 - 147 mEq/L   Potassium 4.1  3.7 - 5.3 mEq/L   Chloride 101  96 - 112 mEq/L   CO2 22  19 - 32 mEq/L   Glucose, Bld 95  70 - 99  mg/dL   BUN 18  6 - 23 mg/dL   Creatinine, Ser 0.64  0.50 - 1.10 mg/dL   Calcium 9.8  8.4 - 10.5 mg/dL   GFR calc non Af Amer >90  >90 mL/min   GFR calc Af Amer >90  >90 mL/min  HEPATIC FUNCTION PANEL      Result Value Ref Range   Total Protein 7.6  6.0 - 8.3 g/dL   Albumin 3.7  3.5 - 5.2 g/dL   AST 29  0 - 37 U/L   ALT 28  0 - 35 U/L   Alkaline Phosphatase 72  39 - 117 U/L   Total Bilirubin <0.2 (*) 0.3 - 1.2 mg/dL   Bilirubin, Direct <0.2  0.0 - 0.3 mg/dL   Indirect Bilirubin NOT CALCULATED  0.3 - 0.9 mg/dL  POC URINE PREG, ED      Result Value Ref Range   Preg Test, Ur NEGATIVE  NEGATIVE   Ct Abdomen Pelvis Wo Contrast  11/28/2013   CLINICAL DATA:  Right flank pain and urinary urgency for 2 days. Elevated white cell count.  EXAM: CT ABDOMEN AND PELVIS WITHOUT CONTRAST  TECHNIQUE: Multidetector  CT imaging of the abdomen and pelvis was performed following the standard protocol without IV contrast.  COMPARISON:  None.  FINDINGS: Lung bases are clear.  The kidneys appear symmetrical in size and shape. No pyelocaliectasis or ureterectasis. No renal, ureteral, or bladder stones are visualized.  Surgical absence of the gallbladder. The unenhanced appearance of the liver, spleen, pancreas, adrenal glands, abdominal aorta, inferior vena cava, and retroperitoneal lymph nodes is unremarkable. The stomach and small bowel are decompressed. Stool-filled colon without distention. No free air or free fluid in the abdomen. Small accessory spleens. Minimal umbilical hernia containing fat.  Pelvis: Uterus and adnexal structures are not enlarged. No free or loculated pelvic fluid collections. Bladder wall is not thickened. Appendix is normal. Mild degenerative changes in the lumbar spine. No destructive bone lesions. Normal  IMPRESSION: No renal or ureteral stone or obstruction. No acute process demonstrated and on unenhanced views of the abdomen and pelvis.   Electronically Signed   By: Lucienne Capers M.D.    On: 11/28/2013 05:59   US Pelvis Complete (Final result)  Result time: 11/28/13 08:48:48    Final result by Rad Results In Interface (11/28/13 08:48:48)    Narrative:   CLINICAL DATA: Right lower quadrant pain.  EXAM: TRANSABDOMINAL AND TRANSVAGINAL ULTRASOUND OF PELVIS  DOPPLER ULTRASOUND OF OVARIES  TECHNIQUE: Both transabdominal and transvaginal ultrasound examinations of the pelvis were performed. Transabdominal technique was performed for global imaging of the pelvis including uterus, ovaries, adnexal regions, and pelvic cul-de-sac.  It was necessary to proceed with endovaginal exam following the transabdominal exam to visualize the uterus, endometrium, ovaries and adnexa . Color and duplex Doppler ultrasound was utilized to evaluate blood flow to the ovaries.  COMPARISON: CT earlier today  FINDINGS: Uterus  Measurements: 8.7 x 6.3 x 6.6 cm. Uterus is retroverted. 8 mm fibroid noted in the posterior wall (intramural).  Endometrium  Thickness: 11 mm in thickness. No focal abnormality visualized.  Right ovary  Measurements: 3.2 x 2.0 x 2.0 cm. 1.8 cm follicle in the right ovary. Normal appearance/no adnexal mass.  Left ovary  Measurements: Prior oophorectomy.  Pulsed Doppler evaluation of both ovaries demonstrates normal low-resistance arterial and venous waveforms within the right ovary.  Other findings  No free fluid.  IMPRESSION: Prior left oophorectomy. Small intramural fibroid. Otherwise unremarkable study.   Electronically Signed By: Rolm Baptise M.D. On: 11/28/2013 08:48             US Transvaginal Non-OB (Final result)  Result time: 11/28/13 08:48:48    Final result by Rad Results In Interface (11/28/13 08:48:48)    Narrative:   CLINICAL DATA: Right lower quadrant pain.  EXAM: TRANSABDOMINAL AND TRANSVAGINAL ULTRASOUND OF PELVIS  DOPPLER ULTRASOUND OF OVARIES  TECHNIQUE: Both transabdominal and transvaginal ultrasound  examinations of the pelvis were performed. Transabdominal technique was performed for global imaging of the pelvis including uterus, ovaries, adnexal regions, and pelvic cul-de-sac.  It was necessary to proceed with endovaginal exam following the transabdominal exam to visualize the uterus, endometrium, ovaries and adnexa . Color and duplex Doppler ultrasound was utilized to evaluate blood flow to the ovaries.  COMPARISON: CT earlier today  FINDINGS: Uterus  Measurements: 8.7 x 6.3 x 6.6 cm. Uterus is retroverted. 8 mm fibroid noted in the posterior wall (intramural).  Endometrium  Thickness: 11 mm in thickness. No focal abnormality visualized.  Right ovary  Measurements: 3.2 x 2.0 x 2.0 cm. 1.8 cm follicle in the right ovary. Normal appearance/no adnexal mass.  Left  ovary  Measurements: Prior oophorectomy.  Pulsed Doppler evaluation of both ovaries demonstrates normal low-resistance arterial and venous waveforms within the right ovary.  Other findings  No free fluid.  IMPRESSION: Prior left oophorectomy. Small intramural fibroid. Otherwise unremarkable study.   Electronically Signed By: Rolm Baptise M.D. On: 11/28/2013 08:48             [place] Art/Ven Flow Abd Pelv Doppler (Final result)  Result time: 11/28/13 08:48:48    Final result by Rad Results In Interface (11/28/13 08:48:48)    Narrative:   CLINICAL DATA: Right lower quadrant pain.  EXAM: TRANSABDOMINAL AND TRANSVAGINAL ULTRASOUND OF PELVIS  DOPPLER ULTRASOUND OF OVARIES  TECHNIQUE: Both transabdominal and transvaginal ultrasound examinations of the pelvis were performed. Transabdominal technique was performed for global imaging of the pelvis including uterus, ovaries, adnexal regions, and pelvic cul-de-sac.  It was necessary to proceed with endovaginal exam following the transabdominal exam to visualize the uterus, endometrium, ovaries and adnexa . Color and duplex Doppler ultrasound was  utilized to evaluate blood flow to the ovaries.  COMPARISON: CT earlier today  FINDINGS: Uterus  Measurements: 8.7 x 6.3 x 6.6 cm. Uterus is retroverted. 8 mm fibroid noted in the posterior wall (intramural).  Endometrium  Thickness: 11 mm in thickness. No focal abnormality visualized.  Right ovary  Measurements: 3.2 x 2.0 x 2.0 cm. 1.8 cm follicle in the right ovary. Normal appearance/no adnexal mass.  Left ovary  Measurements: Prior oophorectomy.  Pulsed Doppler evaluation of both ovaries demonstrates normal low-resistance arterial and venous waveforms within the right ovary.  Other findings  No free fluid.  IMPRESSION: Prior left oophorectomy. Small intramural fibroid. Otherwise unremarkable study.   Electronically Signed By: Rolm Baptise M.D. On: 11/28/2013 08:48          7:26 AM CT is unremarkable.  Hx of cholecystectomy, normal appendix, no KS.  UA is clean.  Will order pelvic US and perform pelvic exam.  7:52 AM Pelvic exam chaperoned by female ER tech, no right or left adnexal tenderness, no uterine tenderness, no vaginal discharge or bleeding, no CMT or friability, no foreign body, no injury to the external genitalia, no other significant findings  9:27 AM Pelvic US as above.  Recommend f/u with PCP/OBGYN.  Will treat for UTI as the patient complains of dysuria.  Discharge to home.  Return precautions given.  Patient is stable and ready for discharge.  Filed Vitals:   11/28/13 0610  BP: 111/65  Pulse: 81  Temp: 97.9 F (36.6 C)  Resp: [address] PA-C 11/28/13 [place], PA-C 11/28/13 [phone]

## 2013-11-28 NOTE — ED Provider Notes (Signed)
CSN: [id number]     Arrival date & time 11/28/13  0043 History   First MD Initiated Contact with Patient 11/28/13 0253     Chief Complaint  Patient presents with  . Flank Pain     (Consider location/radiation/quality/duration/timing/severity/associated sxs/prior Treatment) The history is provided by the patient. No language interpreter was used.  Casey Jordan is a 48 year old female with past medical history of depression presenting to the ED with right-sided flank pain that started on Friday that has progressively gotten worse. Stated that the discomfort is localized to the right side-described as aching, pressure sensation with radiation to her right lower quadrant of her abdomen. Stated that she's been having increased frequency-reported pressure in her bladder when she goes to the bathroom. Stated that she has history of kidney stones, diagnosed approximately 4 years ago - reported that she was told that she had a stone in her right kidney. Stated that she's been having intermittent hot and cold sensations with intermittent dizziness. Stated that she's been feeling nauseous without episodes of emesis. Denied diarrhea, abdominal pain, hematuria, melena, hematochezia, syncopal episode. PCP none  Past Medical History  Diagnosis Date  . Depression    History reviewed. No pertinent past surgical history. History reviewed. No pertinent family history. History  Substance Use Topics  . Smoking status: Current Every Day Smoker  . Smokeless tobacco: Not on file  . Alcohol Use: No   OB History   Grav Para Term Preterm Abortions TAB SAB Ect Mult Living                 Review of Systems  Constitutional: Positive for chills. Negative for fever.  Respiratory: Negative for chest tightness and shortness of breath.   Cardiovascular: Negative for chest pain.  Gastrointestinal: Positive for nausea. Negative for vomiting and abdominal pain.  Genitourinary: Positive for frequency and flank pain  (right ). Negative for hematuria, decreased urine volume and pelvic pain.  Neurological: Negative for dizziness, weakness and numbness.  All other systems reviewed and are negative.     Allergies  Penicillins; Sulfa antibiotics; and Zofran  Home Medications   Prior to Admission medications   Medication Sig Start Date End Date Taking? Authorizing Provider  BIOTIN PO Take 1 tablet by mouth daily.   Yes Historical Provider, MD  Cholecalciferol (VITAMIN D3) 10000 UNITS capsule Take 10,000 Units by mouth daily.   Yes Historical Provider, MD  ibuprofen (ADVIL,MOTRIN) 200 MG tablet Take 400 mg by mouth every 6 (six) hours as needed (pain).   Yes Historical Provider, MD  Multiple Vitamins-Calcium (ONE-A-DAY WOMENS FORMULA) TABS Take 1 tablet by mouth daily.   Yes Historical Provider, MD  venlafaxine (EFFEXOR) 75 MG tablet Take 112.5-150 mg by mouth daily. Takes a tablet and half daily and may take two tablets during period   Yes Historical Provider, MD   BP 134/96  Pulse 117  Temp(Src) 97.8 F (36.6 C) (Oral)  Resp 20  SpO2 100%  LMP 11/21/2013 Physical Exam  Nursing note and vitals reviewed. Constitutional: She is oriented to person, place, and time. She appears well-developed and well-nourished. No distress.  HENT:  Head: Normocephalic and atraumatic.  Mouth/Throat: Oropharynx is clear and moist. No oropharyngeal exudate.  Eyes: Conjunctivae and EOM are normal. Pupils are equal, round, and reactive to light. Right eye exhibits no discharge. Left eye exhibits no discharge.  Neck: Normal range of motion. Neck supple. No tracheal deviation present.  Cardiovascular: Normal rate, regular rhythm and normal heart sounds.  Exam reveals no friction rub.   No murmur heard. Pulmonary/Chest: Effort normal and breath sounds normal. No respiratory distress. She has no wheezes. She has no rales.  Abdominal: Soft. Bowel sounds are normal. She exhibits no distension. There is no tenderness. There is  no rebound and no guarding.  Negative abdominal distension noted Abdomen soft upon palpation  Negative rigidity or guarding upon palpation Positive right-sided CVA tenderness  Musculoskeletal: Normal range of motion.  Lymphadenopathy:    She has no cervical adenopathy.  Neurological: She is alert and oriented to person, place, and time. No cranial nerve deficit. She exhibits normal muscle tone. Coordination normal.  Skin: Skin is warm. No rash noted. She is not diaphoretic. No erythema.  Psychiatric: She has a normal mood and affect. Her behavior is normal. Thought content normal.    ED Course  Procedures (including critical care time) Labs Review Labs Reviewed  URINALYSIS, [place] PREG, ED    Imaging Review No results found.   EKG Interpretation None      MDM   Final diagnoses:  None    Medications  fentaNYL (SUBLIMAZE) injection 50 mcg (50 mcg Intravenous Given 11/28/13 0338)  metoCLOPramide (REGLAN) injection 5 mg (5 mg Intravenous Given 11/28/13 0520)  morphine 4 MG/ML injection 4 mg (4 mg Intravenous Given 11/28/13 0735)   CBC noted mildly elevated WBC of 13.6. BMP negative findings - kidney functioning well. Urine pregnancy negative. UA negative for Hgb, nitrites and leukocytes. CT abdomen and pelvis without contrast negative for renal or ureteral stone or obstruction - no acute processes demonstrated in abdomen.  Hepatic function panel pending.  Discussed case in great detail with Montine Circle, PA-C. Transfer of care to Montine Circle, PA-C at change in shift. Plan is for pain control with further labs and imaging to be performed - pelvic.     Jamse Mead, PA-C 11/28/13 [phone]

## 2013-11-29 LAB — URINE CULTURE: Colony Count: 7000

## 2013-11-29 LAB — GC/CHLAMYDIA PROBE AMP
CT Probe RNA: NEGATIVE
GC Probe RNA: NEGATIVE

## 2013-11-29 NOTE — ED Provider Notes (Signed)
Medical screening examination/treatment/procedure(s) were performed by non-physician practitioner and as supervising physician I was immediately available for consultation/collaboration.  Caedyn Tassinari L Shamone Winzer, MD 11/29/13 0548 

## 2013-11-29 NOTE — ED Provider Notes (Signed)
Medical screening examination/treatment/procedure(s) were performed by non-physician practitioner and as supervising physician I was immediately available for consultation/collaboration.  Richarda Blade, MD 11/29/13 [phone]

## 2014-08-19 ENCOUNTER — Encounter (HOSPITAL_COMMUNITY): Payer: Self-pay | Admitting: Emergency Medicine

## 2014-08-19 ENCOUNTER — Emergency Department (HOSPITAL_COMMUNITY)
Admission: EM | Admit: 2014-08-19 | Discharge: 2014-08-19 | Disposition: A | Discharge status: Home / Self Care | Payer: Medicare HMO | Attending: Emergency Medicine | Admitting: Emergency Medicine

## 2014-08-19 DIAGNOSIS — Z79899 Other long term (current) drug therapy: Secondary | ICD-10-CM | POA: Diagnosis not present

## 2014-08-19 DIAGNOSIS — Z792 Long term (current) use of antibiotics: Secondary | ICD-10-CM | POA: Diagnosis not present

## 2014-08-19 DIAGNOSIS — Z880 Allergy status to penicillin: Secondary | ICD-10-CM | POA: Insufficient documentation

## 2014-08-19 DIAGNOSIS — K625 Hemorrhage of anus and rectum: Secondary | ICD-10-CM | POA: Diagnosis present

## 2014-08-19 DIAGNOSIS — Z720 Tobacco use: Secondary | ICD-10-CM | POA: Insufficient documentation

## 2014-08-19 DIAGNOSIS — F329 Major depressive disorder, single episode, unspecified: Secondary | ICD-10-CM | POA: Diagnosis not present

## 2014-08-19 LAB — CBC
HCT: 41.8 % (ref 36.0–46.0)
Hemoglobin: 13.8 g/dL (ref 12.0–15.0)
MCH: 30.4 pg (ref 26.0–34.0)
MCHC: 33 g/dL (ref 30.0–36.0)
MCV: 92.1 fL (ref 78.0–100.0)
Platelets: 362 10*3/uL (ref 150–400)
RBC: 4.54 MIL/uL (ref 3.87–5.11)
RDW: 14.1 % (ref 11.5–15.5)
WBC: 8.9 10*3/uL (ref 4.0–10.5)

## 2014-08-19 LAB — COMPREHENSIVE METABOLIC PANEL
ALT: 50 U/L — ABNORMAL HIGH (ref 0–35)
AST: 52 U/L — ABNORMAL HIGH (ref 0–37)
Albumin: 3.9 g/dL (ref 3.5–5.2)
Alkaline Phosphatase: 65 U/L (ref 39–117)
Anion gap: 10 (ref 5–15)
BUN: 13 mg/dL (ref 6–23)
CO2: 22 mmol/L (ref 19–32)
Calcium: 9.1 mg/dL (ref 8.4–10.5)
Chloride: 105 mmol/L (ref 96–112)
Creatinine, Ser: 0.67 mg/dL (ref 0.50–1.10)
GFR calc Af Amer: >90 mL/min (ref >90)
GFR calc non Af Amer: >90 mL/min (ref >90)
Glucose, Bld: 112 mg/dL — ABNORMAL HIGH (ref 70–99)
Potassium: 4.2 mmol/L (ref 3.5–5.1)
Sodium: 137 mmol/L (ref 135–145)
Total Bilirubin: 0.6 mg/dL (ref 0.3–1.2)
Total Protein: 7.3 g/dL (ref 6.0–8.3)

## 2014-08-19 LAB — POC OCCULT BLOOD, ED: Fecal Occult Bld: POSITIVE — AB

## 2014-08-19 NOTE — ED Notes (Signed)
Per pt, states blood with BM 4 times this week-no history of hemorrhoids-no dizziness, SOB

## 2014-08-19 NOTE — ED Notes (Signed)
Alert, oriented, and ambulatory upon DC. She was advised to follow up with [facility].

## 2014-08-19 NOTE — Discharge Instructions (Signed)
Bloody Stools Call [facility] gastroenterology tomorrow to schedule the next available appointment. Tell office staff that you were seen here today Bloody stools means there is blood in your poop (stool). It is a sign that there is a problem somewhere in the digestive system. It is important for your doctor to find the cause of your bleeding, so the problem can be treated.  HOME CARE  Only take medicine as told by your doctor.  Eat foods with fiber (prunes, bran cereals).  Drink enough fluids to keep your pee (urine) clear or pale yellow.  Sit in warm water (sitz bath) for 10 to 15 minutes as told by your doctor.  Know how to take your medicines (enemas, suppositories) if advised by your doctor.  Watch for signs that you are getting better or getting worse. GET HELP RIGHT AWAY IF:   You are not getting better.  You start to get better but then get worse again.  You have new problems.  You have severe bleeding from the place where poop comes out (rectum) that does not stop.  You throw up (vomit) blood.  You feel weak or pass out (faint).  You have a fever. MAKE SURE YOU:   Understand these instructions.  Will watch your condition.  Will get help right away if you are not doing well or get worse. Document Released: 06/24/2009 Document Revised: 09/28/2011 Document Reviewed: 11/21/2010 [facility] Patient Information 2015 [place], [place]. This information is not intended to replace advice given to you by your health care provider. Make sure you discuss any questions you have with your health care provider.

## 2014-08-19 NOTE — ED Provider Notes (Signed)
CSN: [id number]     Arrival date & time 08/19/14  0806 History   First MD Initiated Contact with Patient 08/19/14 [phone]     Chief Complaint  Patient presents with  . Rectal Bleeding     (Consider location/radiation/quality/duration/timing/severity/associated sxs/prior Treatment) HPI Patient reports watery diarrhea for several months on a daily basis. She presents today she had blood per rectum last night and again this morning. She denies any abdominal pain, no lightheadedness. No fever no other associated symptoms no treatment prior to coming here no recent antibiotics no recent travel. Nothing makes symptoms better or worse. Past Medical History  Diagnosis Date  . Depression    History reviewed. No pertinent past surgical history. No family history on file. History  Substance Use Topics  . Smoking status: Current Every Day Smoker  . Smokeless tobacco: Not on file  . Alcohol Use: No   no alcohol no drugs OB History    No data available     Review of Systems  Constitutional: Negative.   HENT: Negative.   Respiratory: Negative.   Cardiovascular: Negative.   Gastrointestinal: Positive for diarrhea and blood in stool.  Musculoskeletal: Negative.   Skin: Negative.   Neurological: Negative.   Psychiatric/Behavioral: Negative.   All other systems reviewed and are negative.     Allergies  Penicillins; Sulfa antibiotics; and Zofran  Home Medications   Prior to Admission medications   Medication Sig Start Date End Date Taking? Authorizing Provider  BIOTIN PO Take 1 tablet by mouth daily.    Historical Provider, MD  Cholecalciferol (VITAMIN D3) 10000 UNITS capsule Take 10,000 Units by mouth daily.    Historical Provider, MD  ciprofloxacin (CIPRO) 500 MG tablet Take 1 tablet (500 mg total) by mouth 2 (two) times daily. 11/28/13   Montine Circle, PA-C  HYDROcodone-acetaminophen (NORCO/VICODIN) 5-325 MG per tablet Take 1-2 tablets by mouth every 4 (four) hours as needed.  11/28/13   Montine Circle, PA-C  ibuprofen (ADVIL,MOTRIN) 200 MG tablet Take 400 mg by mouth every 6 (six) hours as needed (pain).    Historical Provider, MD  Multiple Vitamins-Calcium (ONE-A-DAY WOMENS FORMULA) TABS Take 1 tablet by mouth daily.    Historical Provider, MD  venlafaxine (EFFEXOR) 75 MG tablet Take 112.5-150 mg by mouth daily. Takes a tablet and half daily and may take two tablets during period    Historical Provider, MD   BP 139/106 mmHg  Pulse 96  Temp(Src) 98 F (36.7 C) (Oral)  Resp 17  SpO2 100%  LMP 07/25/2014 Physical Exam  Constitutional: She appears well-developed and well-nourished.  HENT:  Head: Normocephalic and atraumatic.  Eyes: Conjunctivae are normal. Pupils are equal, round, and reactive to light.  Neck: Neck supple. No tracheal deviation present. No thyromegaly present.  Cardiovascular: Normal rate and regular rhythm.   No murmur heard. Pulmonary/Chest: Effort normal and breath sounds normal.  Abdominal: Soft. Bowel sounds are normal. She exhibits no distension. There is no tenderness.  Genitourinary:  Rectum normal tone no hemorrhoid slight amount of gross blood present  Musculoskeletal: Normal range of motion. She exhibits no edema or tenderness.  Neurological: She is alert. Coordination normal.  Skin: Skin is warm and dry. No rash noted.  Psychiatric: She has a normal mood and affect.  Nursing note and vitals reviewed.   ED Course  Procedures (including critical care time) Labs Review Labs Reviewed - No data to display  Imaging Review No results found.   EKG Interpretation None  procedure: Anoscopy performed time out performed. An anoscope inserted. Rectal tissue was mildly friable and oozing slight amount of blood. There was brown stool present in rectal vault. No obvious internal hemorrhoid.  11:05 AM resting comfortably Results for orders placed or performed during the hospital encounter of 08/19/14  CBC  Result Value Ref  Range   WBC 8.9 4.0 - 10.5 K/uL   RBC 4.54 3.87 - 5.11 MIL/uL   Hemoglobin 13.8 12.0 - 15.0 g/dL   HCT 41.8 36.0 - 46.0 %   MCV 92.1 78.0 - 100.0 fL   MCH 30.4 26.0 - 34.0 pg   MCHC 33.0 30.0 - 36.0 g/dL   RDW 14.1 11.5 - 15.5 %   Platelets 362 150 - 400 K/uL  Comprehensive metabolic panel  Result Value Ref Range   Sodium 137 135 - 145 mmol/L   Potassium 4.2 3.5 - 5.1 mmol/L   Chloride 105 96 - 112 mmol/L   CO2 22 19 - 32 mmol/L   Glucose, Bld 112 (H) 70 - 99 mg/dL   BUN 13 6 - 23 mg/dL   Creatinine, Ser 0.67 0.50 - 1.10 mg/dL   Calcium 9.1 8.4 - 10.5 mg/dL   Total Protein 7.3 6.0 - 8.3 g/dL   Albumin 3.9 3.5 - 5.2 g/dL   AST 52 (H) 0 - 37 U/L   ALT 50 (H) 0 - 35 U/L   Alkaline Phosphatase 65 39 - 117 U/L   Total Bilirubin 0.6 0.3 - 1.2 mg/dL   GFR calc non Af Amer >90 >90 mL/min   GFR calc Af Amer >90 >90 mL/min   Anion gap 10 5 - 15  POC occult blood, ED Provider will collect  Result Value Ref Range   Fecal Occult Bld POSITIVE (A) NEGATIVE   No results found.  MDM   patient reports having had 2 colonoscopies several years ago. She does not recall gastroenterologist. She reports that [place] showed several polyps which were benign and gastroenterologist reported to her no need for follow-up. She does not recall which gastroenterologist she is seen in the future.  Plan referral [facility] gastroenterology Diagnosis #1 rectal bleeding #2 elevated liver function tests Final diagnoses:  None        Orlie Dakin, MD 08/19/14 [phone]

## 2014-08-20 ENCOUNTER — Encounter: Payer: Self-pay | Admitting: Gastroenterology

## 2014-10-05 ENCOUNTER — Ambulatory Visit: Payer: Medicare HMO | Admitting: Gastroenterology

## 2014-10-31 ENCOUNTER — Other Ambulatory Visit: Payer: Self-pay | Admitting: Obstetrics & Gynecology

## 2014-11-01 LAB — CYTOLOGY - PAP

## 2015-10-31 ENCOUNTER — Encounter (HOSPITAL_COMMUNITY): Payer: Self-pay | Admitting: *Deleted

## 2015-10-31 ENCOUNTER — Inpatient Hospital Stay (HOSPITAL_COMMUNITY)
Admission: AD | Admit: 2015-10-31 | Discharge: 2015-10-31 | Disposition: A | Discharge status: Home / Self Care | Payer: 59 | Source: Ambulatory Visit | Attending: Obstetrics and Gynecology | Admitting: Obstetrics and Gynecology

## 2015-10-31 DIAGNOSIS — R109 Unspecified abdominal pain: Secondary | ICD-10-CM | POA: Diagnosis not present

## 2015-10-31 DIAGNOSIS — Z79899 Other long term (current) drug therapy: Secondary | ICD-10-CM | POA: Diagnosis not present

## 2015-10-31 DIAGNOSIS — Z86010 Personal history of colonic polyps: Secondary | ICD-10-CM | POA: Insufficient documentation

## 2015-10-31 DIAGNOSIS — R112 Nausea with vomiting, unspecified: Secondary | ICD-10-CM | POA: Insufficient documentation

## 2015-10-31 DIAGNOSIS — K625 Hemorrhage of anus and rectum: Secondary | ICD-10-CM

## 2015-10-31 DIAGNOSIS — F329 Major depressive disorder, single episode, unspecified: Secondary | ICD-10-CM | POA: Insufficient documentation

## 2015-10-31 HISTORY — DX: Headache, unspecified: R51.9

## 2015-10-31 HISTORY — DX: Unspecified abnormal cytological findings in specimens from vagina: R87.629

## 2015-10-31 HISTORY — DX: Vitamin D deficiency, unspecified: E55.9

## 2015-10-31 HISTORY — DX: Anemia, unspecified: D64.9

## 2015-10-31 HISTORY — DX: Headache: R51

## 2015-10-31 LAB — CBC
HCT: 38 % (ref 36.0–46.0)
Hemoglobin: 12.8 g/dL (ref 12.0–15.0)
MCH: 30 pg (ref 26.0–34.0)
MCHC: 33.7 g/dL (ref 30.0–36.0)
MCV: 89.2 fL (ref 78.0–100.0)
Platelets: 435 10*3/uL — ABNORMAL HIGH (ref 150–400)
RBC: 4.26 MIL/uL (ref 3.87–5.11)
RDW: 13.8 % (ref 11.5–15.5)
WBC: 10.5 10*3/uL (ref 4.0–10.5)

## 2015-10-31 LAB — URINALYSIS, ROUTINE W REFLEX MICROSCOPIC
Bilirubin Urine: NEGATIVE
Glucose, UA: NEGATIVE mg/dL
Hgb urine dipstick: NEGATIVE
Ketones, ur: NEGATIVE mg/dL
Leukocytes, UA: NEGATIVE
Nitrite: NEGATIVE
Protein, ur: NEGATIVE mg/dL
Specific Gravity, Urine: 1.01 (ref 1.005–1.030)
pH: 6 (ref 5.0–8.0)

## 2015-10-31 NOTE — MAU Note (Signed)
C/o heavy rectal bleeding that started this am; denies constipation; having cramping and nausea with the bleeding;had a period 2 weeks ago and pt's periods are regular;

## 2015-10-31 NOTE — MAU Provider Note (Signed)
History     CSN: [id number]  Arrival date and time: 10/31/15 [id number]   First Provider Initiated Contact with Patient 10/31/15 0913      Chief Complaint  Patient presents with  . Rectal Bleeding   HPI  Patient presents with an episode of abdominal cramping and rectal bleeding around 7 this am. Reports severe cramping and urgency to have a bowel movement. Patient thinks the stool was a normal consistency and was not loose or hard. Patient describes a "handful" of blood on the toilet paper and reported clots and mucous in the paper. The blood was bright red. After this she felt nauseous and had one episode of emesis. She reports rectal pressure but denies rectal pain. Her LMP was two weeks ago and she is very regular. She had intercourse this am before the bleeding but does not feel that the bleeding was vaginal and denies anal intecourse. Patient denies any medication, tobacco, or alcohol use. She has no sick contacts. She has a history of hemorrhoids in 2014. Patient also c/o of fatigue, but denies diarrhea, constipation, weight loss, fever, dysuria, hematuria, and dyspnea.   OB History    Gravida Para Term Preterm AB TAB SAB Ectopic Multiple Living   3 3 3       3       Past Medical History  Diagnosis Date  . Depression   . Headache   . Anemia   . Vitamin D insufficiency   . Vaginal Pap smear, abnormal     Past Surgical History  Procedure Laterality Date  . Colposcopy    . Cholecystectomy    . Ovary surgery    . Posterior fusion cervical spine    . Kidney stone surgery      Family History  Problem Relation Age of Onset  . Diabetes Father   . Heart disease Father   . Kidney disease Father     Social History  Substance Use Topics  . Smoking status: Never Smoker   . Smokeless tobacco: None  . Alcohol Use: No    Allergies:  Allergies  Allergen Reactions  . Avocado     Stomach pain  . Penicillins Other (See Comments)    Get boils  . Sulfa Antibiotics Other (See  Comments)    Gets boils  . Zofran [Ondansetron] Itching    Prescriptions prior to admission  Medication Sig Dispense Refill Last Dose  . acetaminophen (TYLENOL) 500 MG tablet Take 500 mg by mouth every 6 (six) hours as needed for mild pain.   Past Week at Unknown time  . albuterol (PROVENTIL HFA;VENTOLIN HFA) 108 (90 BASE) MCG/ACT inhaler Inhale 1-2 puffs into the lungs every 6 (six) hours as needed for wheezing or shortness of breath.   Past Week at Unknown time  . Cholecalciferol (VITAMIN D3) 10000 UNITS capsule Take 10,000 Units by mouth daily.   Past Week at Unknown time  . ciprofloxacin (CIPRO) 500 MG tablet Take 1 tablet (500 mg total) by mouth 2 (two) times daily. (Patient not taking: Reported on 08/19/2014) 14 tablet 0   . HYDROcodone-acetaminophen (NORCO/VICODIN) 5-325 MG per tablet Take 1-2 tablets by mouth every 4 (four) hours as needed. (Patient not taking: Reported on 08/19/2014) 6 tablet 0   . Multiple Vitamins-Calcium (ONE-A-DAY WOMENS FORMULA) TABS Take 1 tablet by mouth daily.   Past Week at Unknown time    ROS  Positive per HPI  Physical Exam   Blood pressure 132/85, pulse 85, temperature 97.9 F (36.6  C), temperature source Oral, resp. rate 16.  Physical Exam  Constitutional: She is oriented to person, place, and time. She appears well-developed and well-nourished.  HENT:  Head: Normocephalic and atraumatic.  Eyes: Conjunctivae and EOM are normal.  Neck: Normal range of motion.  Cardiovascular: Normal rate, regular rhythm and intact distal pulses.   Respiratory: Effort normal and breath sounds normal. No respiratory distress.  GI: Soft. She exhibits no distension and no mass. There is no tenderness.  No hepatomegaly. Patient reports relief of cramping on palpation of lower abdomen.  Musculoskeletal: She exhibits no edema.  Neurological: She is alert and oriented to person, place, and time.  Skin: Skin is warm and dry.    MAU Course  Procedures  None   Labs: CBC: Hgb 12.8, Plt 435K UA pending   Assessment and Plan  Meghann Averbeck is a 50 yo G3P3 presenting with abdominal pain and one episode of rectal bleeding associated with nausea and vomiting. No fever and normal CBC makes GI infection less likely. Patient has history of hemorrhoids and has had an episode of rectal bleeding with positive occult blood in January 2016. Normal hemoglobin and mildly elevated platelet count reassuring that this is not severe hemorrhage or bleeding disorder. Per chart review patient also has a history of benign colon polyps. Needs to see a gastroenterologist for further w/u.   Plan: Offered phenergan, patient refused. Advised to take OTC ibuprofen for cramping Follow up UA results  Discharge home with instructions to follow up with PCP  Page Spiro 10/31/2015, 9:34 AM

## 2015-10-31 NOTE — MAU Provider Note (Signed)
History     CSN: [id number]  Arrival date and time: 10/31/15 [id number]   First Provider Initiated Contact with Patient 10/31/15 0913      Chief Complaint  Patient presents with  . Rectal Bleeding   HPI Casey Jordan 50 y.o. [id number] presents with complaint of passing some blood in her stool and describes it as quite a big amount.   Past Medical History  Diagnosis Date  . Depression   . Headache   . Anemia   . Vitamin D insufficiency   . Vaginal Pap smear, abnormal     Past Surgical History  Procedure Laterality Date  . Colposcopy    . Cholecystectomy    . Ovary surgery    . Posterior fusion cervical spine    . Kidney stone surgery      Family History  Problem Relation Age of Onset  . Diabetes Father   . Heart disease Father   . Kidney disease Father     Social History  Substance Use Topics  . Smoking status: Never Smoker   . Smokeless tobacco: None  . Alcohol Use: No    Allergies:  Allergies  Allergen Reactions  . Avocado Other (See Comments)    Stomach pain, cause tongue and throat swelling  . Penicillins Other (See Comments)     Has patient had a PCN reaction causing immediate rash, facial/tongue/throat swelling, SOB or lightheadedness with hypotension: No Has patient had a PCN reaction causing severe rash involving mucus membranes or skin necrosis: No Has patient had a PCN reaction that required hospitalization No Has patient had a PCN reaction occurring within the last 10 years: No If all of the above answers are "NO", then may proceed with Cephalosporin use.   . Sulfa Antibiotics Other (See Comments)    Gets boils  . Zofran [Ondansetron] Itching    Prescriptions prior to admission  Medication Sig Dispense Refill Last Dose  . albuterol (PROVENTIL HFA;VENTOLIN HFA) 108 (90 BASE) MCG/ACT inhaler Inhale 1-2 puffs into the lungs every 6 (six) hours as needed for wheezing or shortness of breath.   emergent  . [DISCONTINUED] ciprofloxacin (CIPRO) 500  MG tablet Take 1 tablet (500 mg total) by mouth 2 (two) times daily. (Patient not taking: Reported on 08/19/2014) 14 tablet 0   . [DISCONTINUED] HYDROcodone-acetaminophen (NORCO/VICODIN) 5-325 MG per tablet Take 1-2 tablets by mouth every 4 (four) hours as needed. (Patient not taking: Reported on 08/19/2014) 6 tablet 0     Review of Systems  Genitourinary:       Rectal bleeding    Physical Exam   Blood pressure 132/85, pulse 85, temperature 97.9 F (36.6 C), temperature source Oral, resp. rate 16.  Physical Exam  Nursing note and vitals reviewed. Constitutional: She is oriented to person, place, and time. She appears well-developed and well-nourished.  HENT:  Head: Normocephalic and atraumatic.  Cardiovascular: Normal rate.   Respiratory: Effort normal.  GI: Soft.  Neurological: She is alert and oriented to person, place, and time.  Skin: Skin is warm and dry.  Psychiatric: She has a normal mood and affect. Her behavior is normal. Judgment and thought content normal.   Results for orders placed or performed during the hospital encounter of 10/31/15 (from the past 24 hour(s))  CBC     Status: Abnormal   Collection Time: 10/31/15  9:45 AM  Result Value Ref Range   WBC 10.5 4.0 - 10.5 K/uL   RBC 4.26 3.87 - 5.11 MIL/uL  Hemoglobin 12.8 12.0 - 15.0 g/dL   HCT 38.0 36.0 - 46.0 %   MCV 89.2 78.0 - 100.0 fL   MCH 30.0 26.0 - 34.0 pg   MCHC 33.7 30.0 - 36.0 g/dL   RDW 13.8 11.5 - 15.5 %   Platelets 435 (H) 150 - 400 K/uL  Urinalysis, Routine w reflex microscopic (not at [facility])     Status: None   Collection Time: 10/31/15 10:25 AM  Result Value Ref Range   Color, Urine YELLOW YELLOW   APPearance CLEAR CLEAR   Specific Gravity, Urine 1.010 1.005 - 1.030   pH 6.0 5.0 - 8.0   Glucose, UA NEGATIVE NEGATIVE mg/dL   Hgb urine dipstick NEGATIVE NEGATIVE   Bilirubin Urine NEGATIVE NEGATIVE   Ketones, ur NEGATIVE NEGATIVE mg/dL   Protein, ur NEGATIVE NEGATIVE mg/dL   Nitrite NEGATIVE  NEGATIVE   Leukocytes, UA NEGATIVE NEGATIVE   MAU Course  Procedures  MDM Pt has not had any bleeding since coming into MAU.Hgb stable. Pt should f/u with PCP  Assessment and Plan  Rectal Bleeding Follow up with PCP Discharge  [name] 10/31/2015, 11:10 AM

## 2015-10-31 NOTE — Discharge Instructions (Signed)
Gastrointestinal Bleeding °Gastrointestinal bleeding is bleeding somewhere along the path that food travels through the body (digestive tract). This path is anywhere between the mouth and the opening of the butt (anus). You may have blood in your throw up (vomit) or in your poop (stools). If there is a lot of bleeding, you may need to stay in the hospital. °HOME CARE °· Only take medicine as told by your doctor. °· Eat foods with fiber such as whole grains, fruits, and vegetables. You can also try eating 1 to 3 prunes a day. °· Drink enough fluids to keep your pee (urine) clear or pale yellow. °GET HELP RIGHT AWAY IF:  °· Your bleeding gets worse. °· You feel dizzy, weak, or you pass out (faint). °· You have bad cramps in your back or belly (abdomen). °· You have large blood clumps (clots) in your poop. °· Your problems are getting worse. °MAKE SURE YOU:  °· Understand these instructions. °· Will watch your condition. °· Will get help right away if you are not doing well or get worse. °  °This information is not intended to replace advice given to you by your health care provider. Make sure you discuss any questions you have with your health care provider. °  °Document Released: 04/14/2008 Document Revised: 06/22/2012 Document Reviewed: 12/24/2014 °Elsevier Interactive Patient Education ©2016 [employer] ° °

## 2016-09-21 ENCOUNTER — Ambulatory Visit
Admission: RE | Admit: 2016-09-21 | Discharge: 2016-09-21 | Disposition: A | Discharge status: Home / Self Care | Payer: 59 | Source: Ambulatory Visit | Attending: Family Medicine | Admitting: Family Medicine

## 2016-09-21 ENCOUNTER — Other Ambulatory Visit: Payer: Self-pay | Admitting: Family Medicine

## 2016-09-21 DIAGNOSIS — R1031 Right lower quadrant pain: Secondary | ICD-10-CM

## 2016-09-21 IMAGING — CT CT ABD-PELV W/ CM
3 of 5 series · 13 of 36 positions shown, 19 images · IV contrast (WATER & [ID] ISOVUE 300)
Comparison: CT abdomen and pelvis [DATE].

CLINICAL DATA: Right lower quadrant pain for 2 days. History of
left oophorectomy and cholecystectomy.

EXAM:
CT ABDOMEN AND PELVIS WITH CONTRAST
TECHNIQUE: Multidetector CT imaging of the abdomen and pelvis was performed
using the standard protocol following bolus administration of
intravenous contrast.
CONTRAST:  100 ml [JC] IOPAMIDOL ([JC]) INJECTION 61%

[Series 3: abd/pelvis with · axial · 0.86mm/px · z∈[-355,-30]mm · 7 of 87 slices shown, 12 images]
[im 11/87  soft-tissue]
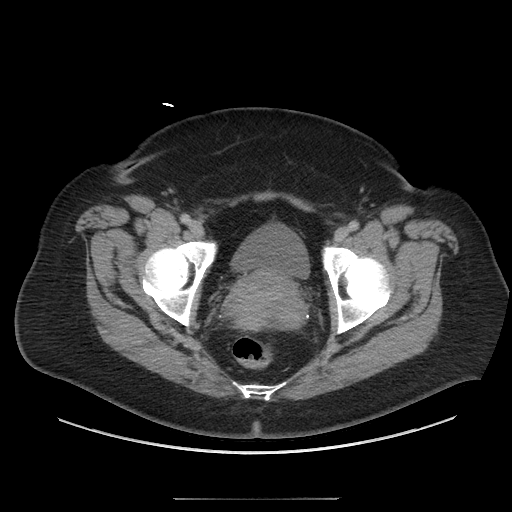
[im 11/87  bone]
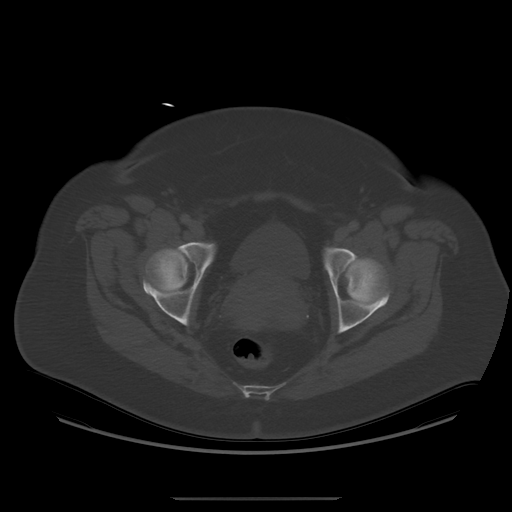
[im 22/87  soft-tissue]
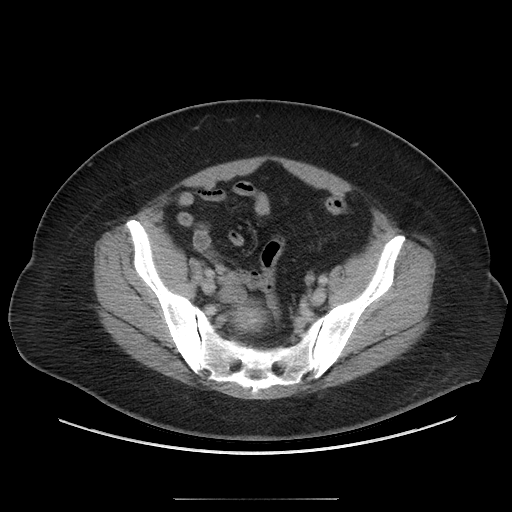
[im 33/87  soft-tissue]
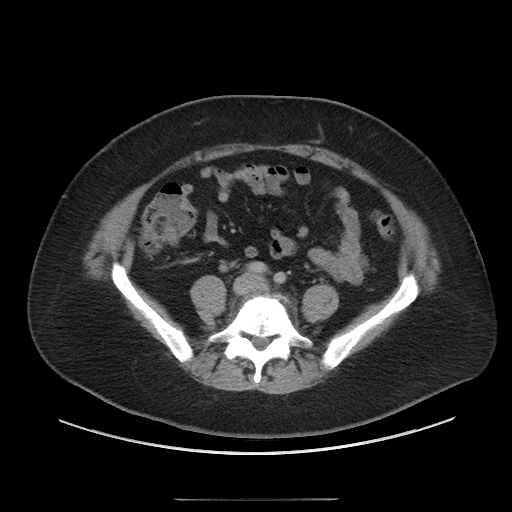
[im 44/87  soft-tissue]
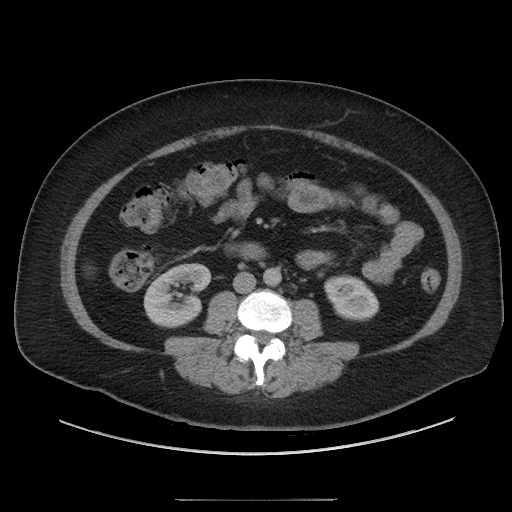
[im 44/87  lung]
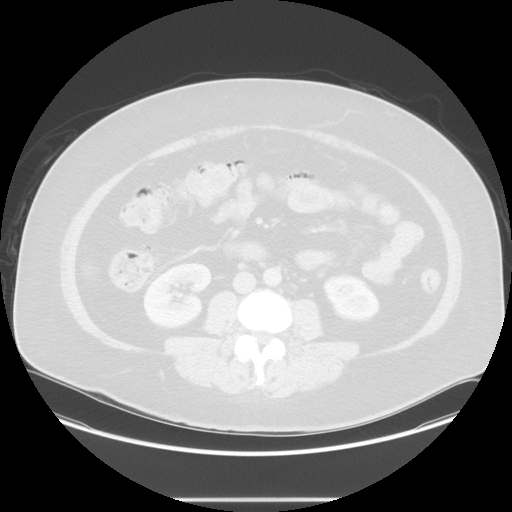
[im 54/87  soft-tissue]
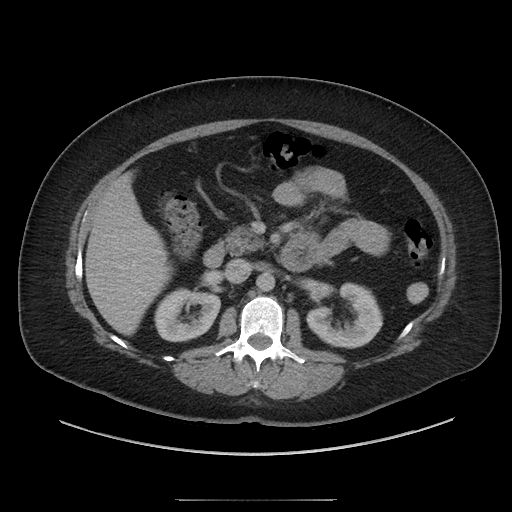
[im 54/87  lung]
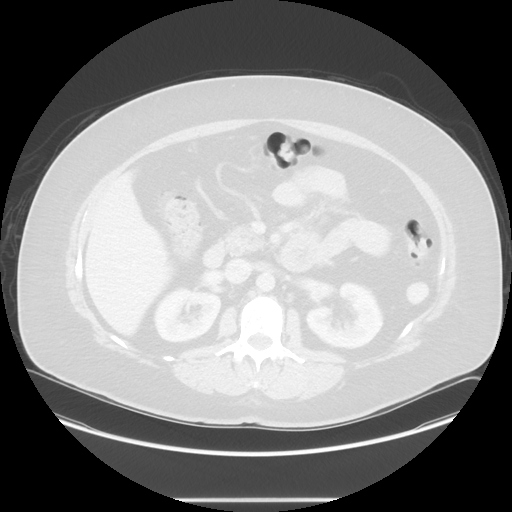
[im 65/87  soft-tissue]
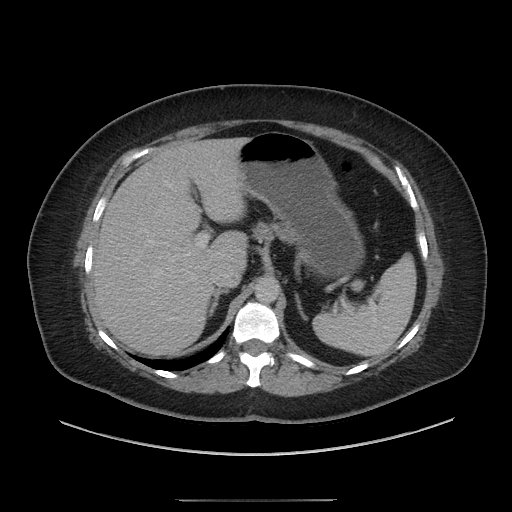
[im 65/87  lung]
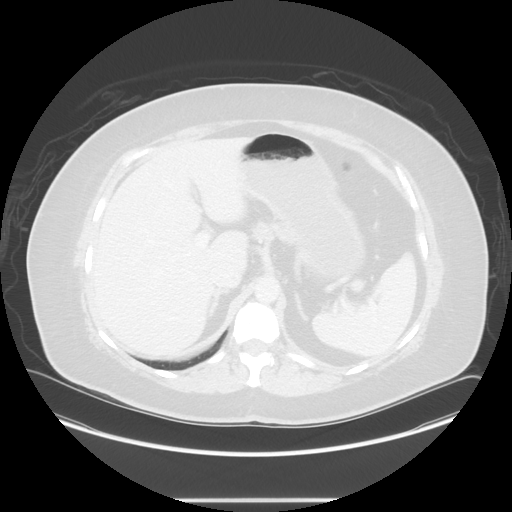
[im 76/87  soft-tissue]
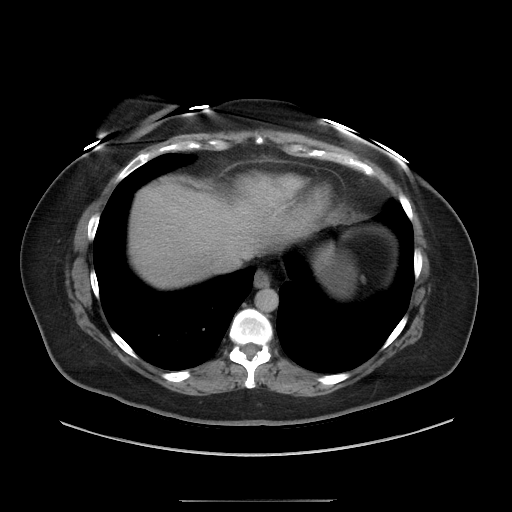
[im 76/87  lung]
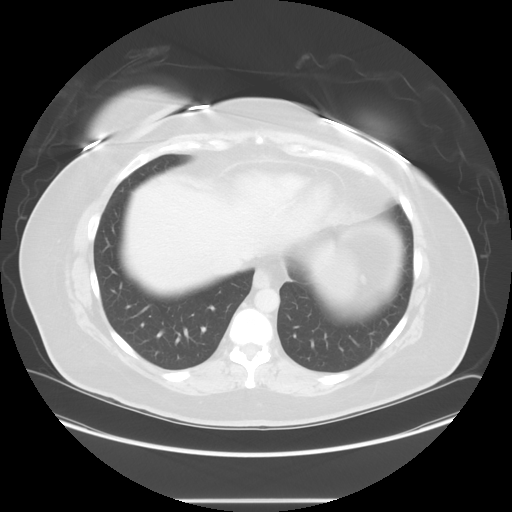

[Series 601: coronal body · coronal · 0.92mm/px · 1 of 136 slices shown, 2 images]
[im 46/136  soft-tissue]
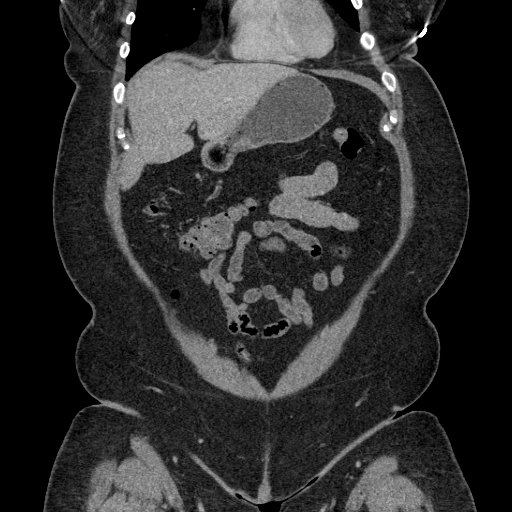
[im 46/136  bone]
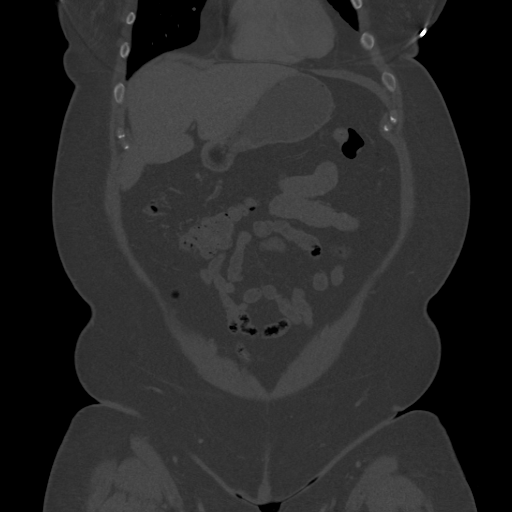

[Series 602: sagittal body · sagittal · 0.92mm/px · 5 of 175 slices shown]
[im 11/175  soft-tissue]
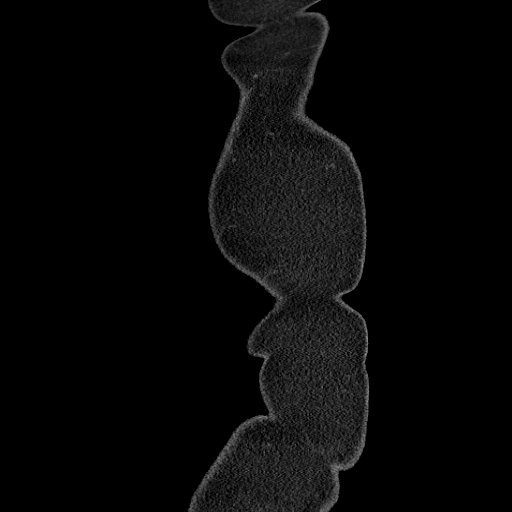
[im 33/175  soft-tissue]
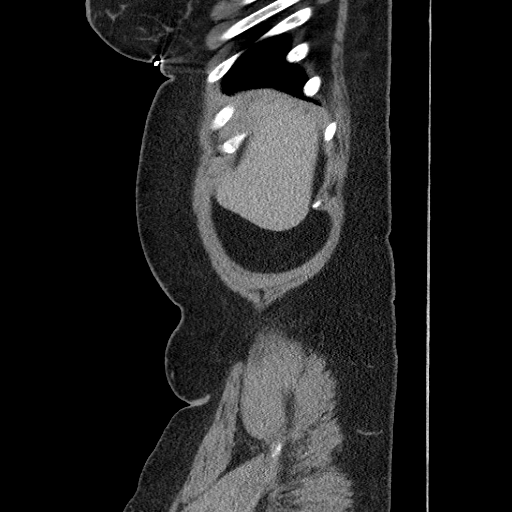
[im 55/175  soft-tissue]
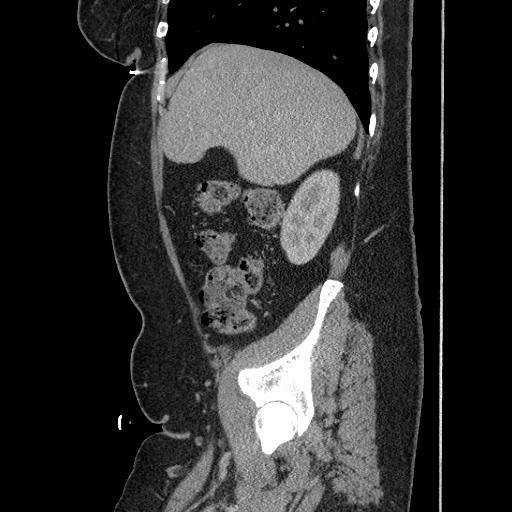
[im 77/175  soft-tissue]
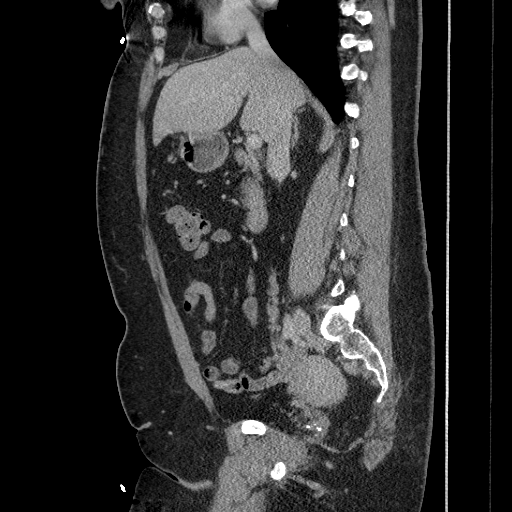
[im 98/175  soft-tissue]
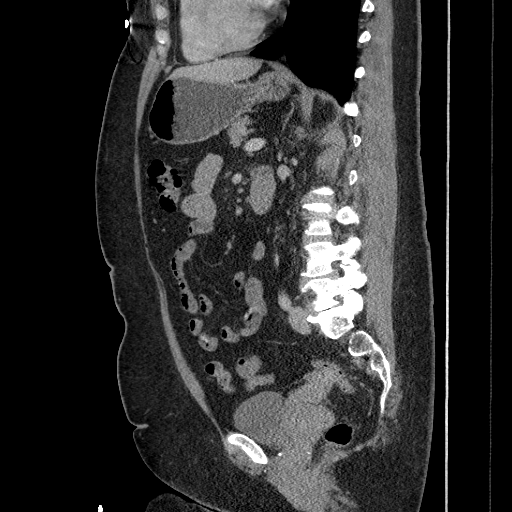

[13 of 36 positions shown; findings below may reference images not displayed]

FINDINGS: Lower chest: Lung bases are clear. Heart size is normal. No pleural
or pericardial effusion.

Hepatobiliary: Status post cholecystectomy. Liver and biliary tree
appear normal.

Pancreas: Unremarkable. No pancreatic ductal dilatation or
surrounding inflammatory changes.

Spleen: Normal in size without focal abnormality.

Adrenals/Urinary Tract: Adrenal glands are unremarkable. Kidneys are
normal, without renal calculi, focal lesion, or hydronephrosis.
Bladder is unremarkable.

Stomach/Bowel: A few sigmoid diverticula are noted. No
diverticulitis. The colon otherwise appears normal. The stomach,
small bowel and appendix all appear normal.

Vascular/Lymphatic: No significant vascular findings are present. No
enlarged abdominal or pelvic lymph nodes.

Reproductive: Uterus appears normal.  No adnexal masses.

Other: Small fat containing umbilical hernia is seen. No fluid
collection.

Musculoskeletal: Loss of disc space height with vacuum disc
phenomenon and endplate sclerosis L5-S1 is identified.
IMPRESSION: No acute abnormality or finding to explain the patient's symptoms.
The appendix appears normal.

Mild diverticulosis without diverticulitis.

Small fat containing umbilical hernia.

Degenerative disc disease L5-S1.

## 2016-09-21 MED ORDER — IOPAMIDOL (ISOVUE-300) INJECTION 61%
100.0000 mL | Freq: Once | INTRAVENOUS | Status: AC | PRN
  Administered 2016-09-21: 100 mL via INTRAVENOUS

## 2016-12-21 DIAGNOSIS — G2581 Restless legs syndrome: Secondary | ICD-10-CM | POA: Insufficient documentation

## 2016-12-21 DIAGNOSIS — Z981 Arthrodesis status: Secondary | ICD-10-CM

## 2016-12-21 DIAGNOSIS — Z86718 Personal history of other venous thrombosis and embolism: Secondary | ICD-10-CM

## 2016-12-21 DIAGNOSIS — E669 Obesity, unspecified: Secondary | ICD-10-CM

## 2016-12-21 HISTORY — DX: Obesity, unspecified: E66.9

## 2016-12-21 HISTORY — DX: Arthrodesis status: Z98.1

## 2016-12-21 HISTORY — DX: Restless legs syndrome: G25.81

## 2016-12-21 HISTORY — DX: Personal history of other venous thrombosis and embolism: Z86.718

## 2017-03-15 ENCOUNTER — Encounter (HOSPITAL_COMMUNITY): Payer: Self-pay | Admitting: *Deleted

## 2017-03-26 NOTE — Patient Instructions (Signed)
Your procedure is scheduled on:  Friday, Sept. 14, 2018  Enter through the [employer] [facility] at:  6:00 AM  Pick up the phone at the desk and dial [phone].  Call this number if you have problems the morning of surgery: [phone].  Remember: Do NOT eat food or drink after:  Midnight Thursday  Take these medicines the morning of surgery with a SIP OF WATER:  None  Stop ALL herbal medications and Ibuprofen at this time  Do NOT smoke the day of surgery.  Do NOT wear jewelry (body piercing), metal hair clips/bobby pins, make-up, artifical eyelashes or nail polish. Do NOT wear lotions, powders, or perfumes.  You may wear deodorant. Do NOT shave for 48 hours prior to surgery. Do NOT bring valuables to the hospital. Contacts, dentures, or bridgework may not be worn into surgery.  Have a responsible adult drive you home and stay with you for 24 hours after your procedure  Bring a copy of your healthcare power of attorney and living will documents.

## 2017-03-28 NOTE — H&P (Signed)
Casey Jordan is an 51 y.o. female with heavy, irregular menstrual bleeding.  Ultrasound (SIS) shows 13 mm endometrial polyp.  The patient presents for surgical management.  Declines hysterectomy.  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    Pertinent Gynecological History: Menses: flow is excessive with use of 8 pads or tampons on heaviest days Bleeding: dysfunctional uterine bleeding Contraception: none DES exposure: unknown Blood transfusions: none Sexually transmitted diseases: no past history Previous GYN Procedures: L/S USO  Last mammogram: normal Date: 2018 Last pap: normal Date: 2018 OB History: G3, P3   Menstrual History: Menarche age: n/a No LMP recorded.    Past Medical History:  Diagnosis Date  . Anemia   . Depression   . Headache   . Vaginal Pap smear, abnormal   . Vitamin D insufficiency     Past Surgical History:  Procedure Laterality Date  . CHOLECYSTECTOMY    . COLPOSCOPY    . KIDNEY STONE SURGERY    . OVARY SURGERY    . POSTERIOR FUSION CERVICAL SPINE      Family History  Problem Relation Age of Onset  . Diabetes Father   . Heart disease Father   . Kidney disease Father     Social History:  reports that she has never smoked. She does not have any smokeless tobacco history on file. She reports that she does not drink alcohol or use drugs.  Allergies:  Allergies  Allergen Reactions  . Avocado Other (See Comments)    Stomach pain, cause tongue and throat swelling  . Codeine Nausea And Vomiting  . Naproxen Swelling  . Penicillins Other (See Comments)     Has patient had a PCN reaction causing immediate rash, facial/tongue/throat swelling, SOB or lightheadedness with hypotension: No Has patient had a PCN reaction causing severe rash involving mucus membranes or skin necrosis: No Has patient had a PCN reaction that required hospitalization No Has patient had a PCN reaction occurring within the last 10 years: No If all of the  above answers are "NO", then may proceed with Cephalosporin use.   . Sulfa Antibiotics Other (See Comments)    Gets boils  . Zofran [Ondansetron] Itching    No prescriptions prior to admission.    ROS  There were no vitals taken for this visit. Physical Exam  Constitutional: She is oriented to person, place, and time. She appears well-developed and well-nourished.  GI: Soft. There is no tenderness. There is no rebound.  Neurological: She is alert and oriented to person, place, and time.  Skin: Skin is warm and dry.  Psychiatric: She has a normal mood and affect.    No results found for this or any previous visit (from the past 24 hour(s)).  No results found.  Assessment/Plan: 51yo with IMB/HMB and endometrial polyp -H/S, D&C, Myosure -Patient has been counseled re: risk of bleeding, infection, scarring, and damage to surrounding structures.  All questions were answered and the patient wishes to proceed.  Casey Jordan, [place] 03/28/2017, 11:55 AM

## 2017-03-29 ENCOUNTER — Encounter (HOSPITAL_COMMUNITY)
Admission: RE | Admit: 2017-03-29 | Discharge: 2017-03-29 | Disposition: A | Discharge status: Home / Self Care | Payer: 59 | Source: Ambulatory Visit | Attending: Obstetrics & Gynecology | Admitting: Obstetrics & Gynecology

## 2017-03-29 ENCOUNTER — Encounter (HOSPITAL_COMMUNITY): Payer: Self-pay

## 2017-03-29 DIAGNOSIS — Z01818 Encounter for other preprocedural examination: Secondary | ICD-10-CM | POA: Insufficient documentation

## 2017-03-29 HISTORY — DX: Gastro-esophageal reflux disease without esophagitis: K21.9

## 2017-03-29 HISTORY — DX: Personal history of urinary calculi: Z87.442

## 2017-03-29 HISTORY — DX: Unspecified asthma, uncomplicated: J45.909

## 2017-03-29 HISTORY — DX: Acute embolism and thrombosis of unspecified deep veins of unspecified lower extremity: I82.409

## 2017-03-29 LAB — BASIC METABOLIC PANEL
Anion gap: 13 (ref 5–15)
BUN: 15 mg/dL (ref 6–20)
CO2: 21 mmol/L — ABNORMAL LOW (ref 22–32)
Calcium: 9.5 mg/dL (ref 8.9–10.3)
Chloride: 102 mmol/L (ref 101–111)
Creatinine, Ser: 0.73 mg/dL (ref 0.44–1.00)
GFR calc Af Amer: >60 mL/min (ref >60)
GFR calc non Af Amer: >60 mL/min (ref >60)
Glucose, Bld: 88 mg/dL (ref 65–99)
Potassium: 3.9 mmol/L (ref 3.5–5.1)
Sodium: 136 mmol/L (ref 135–145)

## 2017-03-29 LAB — CBC
HCT: 37.7 % (ref 36.0–46.0)
Hemoglobin: 12.5 g/dL (ref 12.0–15.0)
MCH: 29.4 pg (ref 26.0–34.0)
MCHC: 33.2 g/dL (ref 30.0–36.0)
MCV: 88.7 fL (ref 78.0–100.0)
Platelets: 467 10*3/uL — ABNORMAL HIGH (ref 150–400)
RBC: 4.25 MIL/uL (ref 3.87–5.11)
RDW: 13.6 % (ref 11.5–15.5)
WBC: 11.3 10*3/uL — ABNORMAL HIGH (ref 4.0–10.5)

## 2017-04-02 ENCOUNTER — Encounter (HOSPITAL_COMMUNITY): Payer: Self-pay

## 2017-04-02 ENCOUNTER — Ambulatory Visit (HOSPITAL_COMMUNITY): Payer: 59 | Admitting: Anesthesiology

## 2017-04-02 ENCOUNTER — Encounter (HOSPITAL_COMMUNITY)
Admission: AD | Disposition: A | Discharge status: Home / Self Care | Payer: Self-pay | Source: Ambulatory Visit | Attending: Obstetrics & Gynecology

## 2017-04-02 ENCOUNTER — Ambulatory Visit (HOSPITAL_COMMUNITY)
Admission: AD | Admit: 2017-04-02 | Discharge: 2017-04-02 | Disposition: A | Discharge status: Home / Self Care | Payer: 59 | Source: Ambulatory Visit | Attending: Obstetrics & Gynecology | Admitting: Obstetrics & Gynecology

## 2017-04-02 DIAGNOSIS — K219 Gastro-esophageal reflux disease without esophagitis: Secondary | ICD-10-CM | POA: Diagnosis not present

## 2017-04-02 DIAGNOSIS — N840 Polyp of corpus uteri: Secondary | ICD-10-CM | POA: Diagnosis not present

## 2017-04-02 DIAGNOSIS — Z86718 Personal history of other venous thrombosis and embolism: Secondary | ICD-10-CM | POA: Insufficient documentation

## 2017-04-02 DIAGNOSIS — N921 Excessive and frequent menstruation with irregular cycle: Secondary | ICD-10-CM | POA: Insufficient documentation

## 2017-04-02 HISTORY — PX: HYSTEROSCOPY WITH D & C: SHX1775

## 2017-04-02 LAB — PREGNANCY, URINE: Preg Test, Ur: NEGATIVE

## 2017-04-02 SURGERY — DILATATION AND CURETTAGE /HYSTEROSCOPY
Anesthesia: General | Site: Vagina

## 2017-04-02 MED ORDER — PROPOFOL 10 MG/ML IV BOLUS
INTRAVENOUS | Status: AC
  Filled 2017-04-02: qty 20

## 2017-04-02 MED ORDER — LIDOCAINE HCL (CARDIAC) 20 MG/ML IV SOLN
INTRAVENOUS | Status: DC | PRN
  Administered 2017-04-02: 30 mg via INTRAVENOUS
  Administered 2017-04-02: 70 mg via INTRAVENOUS

## 2017-04-02 MED ORDER — CYCLOBENZAPRINE HCL 5 MG PO TABS: 5.0000 mg | ORAL_TABLET | Freq: Three times a day (TID) | ORAL | 0 refills | Status: DC | PRN

## 2017-04-02 MED ORDER — HYDROMORPHONE HCL 1 MG/ML IJ SOLN
INTRAMUSCULAR | Status: AC
  Filled 2017-04-02: qty 0.5

## 2017-04-02 MED ORDER — HYDROMORPHONE HCL 1 MG/ML IJ SOLN
0.2500 mg | INTRAMUSCULAR | Status: DC | PRN
  Administered 2017-04-02 (×2): 0.5 mg via INTRAVENOUS

## 2017-04-02 MED ORDER — SCOPOLAMINE 1 MG/3DAYS TD PT72
1.0000 | MEDICATED_PATCH | Freq: Once | TRANSDERMAL | Status: DC
  Administered 2017-04-02: 1.5 mg via TRANSDERMAL

## 2017-04-02 MED ORDER — LIDOCAINE HCL (CARDIAC) 20 MG/ML IV SOLN
INTRAVENOUS | Status: AC
  Filled 2017-04-02: qty 5

## 2017-04-02 MED ORDER — FENTANYL CITRATE (PF) 100 MCG/2ML IJ SOLN
INTRAMUSCULAR | Status: AC
  Filled 2017-04-02: qty 2

## 2017-04-02 MED ORDER — KETOROLAC TROMETHAMINE 30 MG/ML IJ SOLN
INTRAMUSCULAR | Status: DC | PRN
  Administered 2017-04-02: 30 mg via INTRAVENOUS

## 2017-04-02 MED ORDER — MIDAZOLAM HCL 2 MG/2ML IJ SOLN
INTRAMUSCULAR | Status: AC
  Filled 2017-04-02: qty 2

## 2017-04-02 MED ORDER — SODIUM CHLORIDE 0.9 % IR SOLN
Status: DC | PRN
  Administered 2017-04-02: 3000 mL

## 2017-04-02 MED ORDER — IBUPROFEN 800 MG PO TABS: 800.0000 mg | ORAL_TABLET | Freq: Four times a day (QID) | ORAL | 0 refills | Status: DC | PRN

## 2017-04-02 MED ORDER — MIDAZOLAM HCL 2 MG/2ML IJ SOLN
INTRAMUSCULAR | Status: DC | PRN
  Administered 2017-04-02: 1 mg via INTRAVENOUS

## 2017-04-02 MED ORDER — FENTANYL CITRATE (PF) 100 MCG/2ML IJ SOLN
INTRAMUSCULAR | Status: DC | PRN
Start: 2017-04-02 — End: 2017-04-02
  Administered 2017-04-02 (×2): 50 ug via INTRAVENOUS

## 2017-04-02 MED ORDER — KETOROLAC TROMETHAMINE 30 MG/ML IJ SOLN
INTRAMUSCULAR | Status: AC
  Filled 2017-04-02: qty 1

## 2017-04-02 MED ORDER — DEXAMETHASONE SODIUM PHOSPHATE 4 MG/ML IJ SOLN
INTRAMUSCULAR | Status: AC
  Filled 2017-04-02: qty 1

## 2017-04-02 MED ORDER — ONDANSETRON HCL 4 MG/2ML IJ SOLN
INTRAMUSCULAR | Status: AC
  Filled 2017-04-02: qty 2

## 2017-04-02 MED ORDER — PROPOFOL 10 MG/ML IV BOLUS
INTRAVENOUS | Status: DC | PRN
  Administered 2017-04-02: 180 mg via INTRAVENOUS

## 2017-04-02 MED ORDER — PROMETHAZINE HCL 25 MG/ML IJ SOLN
6.2500 mg | INTRAMUSCULAR | Status: DC | PRN
  Administered 2017-04-02: 6.25 mg via INTRAVENOUS

## 2017-04-02 MED ORDER — PROMETHAZINE HCL 25 MG/ML IJ SOLN
INTRAMUSCULAR | Status: AC
  Filled 2017-04-02: qty 1

## 2017-04-02 MED ORDER — LACTATED RINGERS IV SOLN
INTRAVENOUS | Status: DC
  Administered 2017-04-02: 07:00:00 via INTRAVENOUS

## 2017-04-02 MED ORDER — SCOPOLAMINE 1 MG/3DAYS TD PT72
MEDICATED_PATCH | TRANSDERMAL | Status: DC
Start: 2017-04-02 — End: 2017-04-02
  Administered 2017-04-02: 1.5 mg via TRANSDERMAL
  Filled 2017-04-02: qty 1

## 2017-04-02 MED ORDER — DEXAMETHASONE SODIUM PHOSPHATE 10 MG/ML IJ SOLN
INTRAMUSCULAR | Status: DC | PRN
  Administered 2017-04-02: 4 mg via INTRAVENOUS

## 2017-04-02 SURGICAL SUPPLY — 19 items
CANISTER SUCT 3000ML PPV (MISCELLANEOUS) ×3 IMPLANT
CATH ROBINSON RED A/P 16FR (CATHETERS) ×3 IMPLANT
CLOTH BEACON ORANGE TIMEOUT ST (SAFETY) ×3 IMPLANT
CONTAINER PREFILL 10% NBF 60ML (FORM) ×6 IMPLANT
DEVICE MYOSURE LITE (MISCELLANEOUS) IMPLANT
DEVICE MYOSURE REACH (MISCELLANEOUS) IMPLANT
FILTER ARTHROSCOPY CONVERTOR (FILTER) ×3 IMPLANT
GLOVE BIO SURGEON STRL SZ 6 (GLOVE) ×3 IMPLANT
GLOVE BIOGEL PI IND STRL 6 (GLOVE) ×2 IMPLANT
GLOVE BIOGEL PI IND STRL 7.0 (GLOVE) ×2 IMPLANT
GLOVE BIOGEL PI INDICATOR 6 (GLOVE) ×1
GLOVE BIOGEL PI INDICATOR 7.0 (GLOVE) ×1
GOWN STRL REUS W/TWL LRG LVL3 (GOWN DISPOSABLE) ×6 IMPLANT
PACK VAGINAL MINOR WOMEN LF (CUSTOM PROCEDURE TRAY) ×3 IMPLANT
PAD OB MATERNITY 4.3X12.25 (PERSONAL CARE ITEMS) ×3 IMPLANT
SEAL ROD LENS SCOPE MYOSURE (ABLATOR) ×3 IMPLANT
TOWEL OR 17X24 6PK STRL BLUE (TOWEL DISPOSABLE) ×6 IMPLANT
TUBING AQUILEX INFLOW (TUBING) ×3 IMPLANT
TUBING AQUILEX OUTFLOW (TUBING) ×3 IMPLANT

## 2017-04-02 NOTE — Discharge Instructions (Signed)
Post Anesthesia Home Care Instructions  Activity: Get plenty of rest for the remainder of the day. A responsible individual must stay with you for 24 hours following the procedure.  For the next 24 hours, DO NOT: -Drive a car -Paediatric nurse -Drink alcoholic beverages -Take any medication unless instructed by your physician -Make any legal decisions or sign important papers.  Meals: Start with liquid foods such as gelatin or soup. Progress to regular foods as tolerated. Avoid greasy, spicy, heavy foods. If nausea and/or vomiting occur, drink only clear liquids until the nausea and/or vomiting subsides. Call your physician if vomiting continues.  Special Instructions/Symptoms: Your throat may feel dry or sore from the anesthesia or the breathing tube placed in your throat during surgery. If this causes discomfort, gargle with warm salt water. The discomfort should disappear within 24 hours.  If you had a scopolamine patch placed behind your ear for the management of post- operative nausea and/or vomiting:  1. The medication in the patch is effective for 72 hours, after which it should be removed.  Wrap patch in a tissue and discard in the trash. Wash hands thoroughly with soap and water. 2. You may remove the patch earlier than 72 hours if you experience unpleasant side effects which may include dry mouth, dizziness or visual disturbances. 3. Avoid touching the patch. Wash your hands with soap and water after contact with the patch.   DISCHARGE INSTRUCTIONS: D&C / D&E The following instructions have been prepared to help you care for yourself upon your return home.   Personal hygiene:  Use sanitary pads for vaginal drainage, not tampons.  Shower the day after your procedure.  NO tub baths, pools or Jacuzzis for 2-3 weeks.  Wipe front to back after using the bathroom.  Activity and limitations:  Do NOT drive or operate any equipment for 24 hours. The effects of anesthesia are  still present and drowsiness may result.  Do NOT rest in bed all day.  Walking is encouraged.  Walk up and down stairs slowly.  You may resume your normal activity in one to two days or as indicated by your physician.  Sexual activity: NO intercourse for at least 2 weeks after the procedure, or as indicated by your physician.  Diet: Eat a light meal as desired this evening. You may resume your usual diet tomorrow.  Return to work: You may resume your work activities in one to two days or as indicated by your doctor.  What to expect after your surgery: Expect to have vaginal bleeding/discharge for 2-3 days and spotting for up to 10 days. It is not unusual to have soreness for up to 1-2 weeks. You may have a slight burning sensation when you urinate for the first day. Mild cramps may continue for a couple of days. You may have a regular period in 2-6 weeks.  Call your doctor for any of the following:  Excessive vaginal bleeding, saturating and changing one pad every hour.  Inability to urinate 6 hours after discharge from hospital.  Pain not relieved by pain medication.  Fever of 100.4 F or greater.  Unusual vaginal discharge or odor.   Call for an appointment:    Patients signature: ______________________  Nurses signature ________________________  Support person's signature_______________________   Call MD for temperature greater than 100.4, heavy vaginal bleeding, severe abdominal pain, intractable nausea and/or vomiting, or respiratory distress.  Call office to schedule postop appointment in 2 weeks.  Pelvic rest x 6 weeks.

## 2017-04-02 NOTE — Transfer of Care (Signed)
Immediate Anesthesia Transfer of Care Note  Patient: Casey Jordan  Procedure(s) Performed: Procedure(s): DILATATION AND CURETTAGE /HYSTEROSCOPY (N/A)  Patient Location: PACU  Anesthesia Type:General  Level of Consciousness: sedated, drowsy and patient cooperative  Airway & Oxygen Therapy: Patient Spontanous Breathing and Patient connected to nasal cannula oxygen  Post-op Assessment: Report given to RN and Post -op Vital signs reviewed and stable  Post vital signs: Reviewed and stable  Last Vitals:  Vitals:   04/02/17 0613  BP: 103/86  Pulse: 90  Resp: 18  Temp: 36.8 C  SpO2: 98%    Last Pain:  Vitals:   04/02/17 0613  TempSrc: Oral      Patients Stated Pain Goal: 3 (93/81/82 9937)  Complications: No apparent anesthesia complications

## 2017-04-02 NOTE — Anesthesia Postprocedure Evaluation (Signed)
Anesthesia Post Note  Patient: Casey Jordan  Procedure(s) Performed: Procedure(s) (LRB): DILATATION AND CURETTAGE /HYSTEROSCOPY (N/A)     Patient location during evaluation: PACU Anesthesia Type: General Level of consciousness: awake Pain management: pain level controlled Vital Signs Assessment: post-procedure vital signs reviewed and stable Respiratory status: spontaneous breathing Cardiovascular status: stable Postop Assessment: no apparent nausea or vomiting Anesthetic complications: no    Last Vitals:  Vitals:   04/02/17 0830 04/02/17 0845  BP: (!) 96/50 107/75  Pulse: 74 69  Resp: 18 14  Temp:    SpO2: 99% 99%    Last Pain:  Vitals:   04/02/17 0830  TempSrc:   PainSc: 6    Pain Goal: Patients Stated Pain Goal: 3 (04/02/17 4742)               Casey Jordan,Casey Jordan [place] [facility]

## 2017-04-02 NOTE — Progress Notes (Signed)
No change to H&P.  Casey Sinnett, DO 

## 2017-04-02 NOTE — Anesthesia Procedure Notes (Signed)
Procedure Name: LMA Insertion Date/Time: 04/02/2017 7:20 AM Performed by: Stacie Glaze C Patient Re-evaluated:Patient Re-evaluated prior to induction Oxygen Delivery Method: Circle system utilized and Simple face mask Preoxygenation: Pre-oxygenation with 100% oxygen Induction Type: IV induction Ventilation: Mask ventilation without difficulty LMA Size: 4.0 Grade View: Grade II Number of attempts: 1 Placement Confirmation: positive ETCO2 and breath sounds checked- equal and bilateral Dental Injury: Teeth and Oropharynx as per pre-operative assessment

## 2017-04-02 NOTE — Anesthesia Preprocedure Evaluation (Addendum)
Anesthesia Evaluation  Patient identified by MRN, date of birth, ID band Patient awake    Reviewed: Allergy & Precautions, NPO status , Patient's Chart, lab work & pertinent test results  Airway Mallampati: III  TM Distance: >3 FB Neck ROM: Full    Dental no notable dental hx.    Pulmonary asthma (mild, controlled) , PE   Pulmonary exam normal breath sounds clear to auscultation       Cardiovascular negative cardio ROS Normal cardiovascular exam Rhythm:Regular Rate:Normal     Neuro/Psych  Headaches, Depression    GI/Hepatic Neg liver ROS, GERD  Medicated and Controlled,  Endo/Other  negative endocrine ROS  Renal/GU negative Renal ROS     Musculoskeletal negative musculoskeletal ROS (+)   Abdominal (+) + obese,   Peds  Hematology  (+) anemia ,   Anesthesia Other Findings DVT (deep venous thrombosis)   Reproductive/Obstetrics hcg negative                            Anesthesia Physical Anesthesia Plan  ASA: III  Anesthesia Plan: General   Post-op Pain Management:    Induction: Intravenous  PONV Risk Score and Plan:   Airway Management Planned: LMA  Additional Equipment:   Intra-op Plan:   Post-operative Plan: Extubation in OR  Informed Consent: I have reviewed the patients History and Physical, chart, labs and discussed the procedure including the risks, benefits and alternatives for the proposed anesthesia with the patient or authorized representative who has indicated his/her understanding and acceptance.   Dental advisory given  Plan Discussed with: CRNA  Anesthesia Plan Comments:         Anesthesia Quick Evaluation

## 2017-04-02 NOTE — Op Note (Signed)
PREOPERATIVE DIAGNOSIS:  51 y.o. with heavy, irregular uterine bleeding  POSTOPERATIVE DIAGNOSIS: The same  PROCEDURE: Hysteroscopy, Dilation and Curettage.  SURGEON:  Dr. Linda Hedges  INDICATIONS: 51 y.o. [phone]  here for scheduled surgery for treatment and diagnosis of heavy, irregular menstrual bleeding.   Risks of surgery were discussed with the patient including but not limited to: bleeding which may require transfusion; infection which may require antibiotics; injury to uterus or surrounding organs; intrauterine scarring which may impair future fertility; need for additional procedures including laparotomy or laparoscopy; and other postoperative/anesthesia complications. Written informed consent was obtained.    FINDINGS:  A 8 week size uterus.  Diffuse proliferative endometrium.  Normal ostia bilaterally.  Small endometrial polyp on right cavitary side wall.  ANESTHESIA:   General  ESTIMATED BLOOD LOSS:  Less than 20 ml  SPECIMENS: Endometrial curettings sent to pathology  COMPLICATIONS:  None immediate.  PROCEDURE DETAILS:  The patient was taken to the operating room where general anesthesia was administered and was found to be adequate.  After an adequate timeout was performed, she was placed in the dorsal lithotomy position and examined; then prepped and draped in the sterile manner.   Her bladder was catheterized for an unmeasured amount of clear, yellow urine. A speculum was then placed in the patient's vagina and a single tooth tenaculum was applied to the anterior lip of the cervix. The uteus was sounded to 9.5 cm and dilated manually with metal dilators to accommodate the operative hysteroscope.  Once the cervix was dilated, the hysteroscope was inserted under direct visualization using saline as a suspension medium.  The uterine cavity was carefully examined, both ostia were recognized.   After further careful visualization of the uterine cavity, the hysteroscope was removed  under direct visualization.  A sharp curettage was then performed to obtain a moderate amount of endometrial curettings.  The tenaculum was removed from the anterior lip of the cervix and the vaginal speculum was removed after noting good hemostasis.  The patient tolerated the procedure well and was taken to the recovery area awake, extubated and in stable condition.

## 2017-04-03 ENCOUNTER — Encounter (HOSPITAL_COMMUNITY): Payer: Self-pay | Admitting: Obstetrics & Gynecology

## 2017-05-28 DIAGNOSIS — K219 Gastro-esophageal reflux disease without esophagitis: Secondary | ICD-10-CM | POA: Insufficient documentation

## 2017-06-29 DIAGNOSIS — E782 Mixed hyperlipidemia: Secondary | ICD-10-CM | POA: Insufficient documentation

## 2017-06-29 HISTORY — DX: Mixed hyperlipidemia: E78.2

## 2017-08-06 DIAGNOSIS — J4520 Mild intermittent asthma, uncomplicated: Secondary | ICD-10-CM

## 2017-08-06 DIAGNOSIS — F411 Generalized anxiety disorder: Secondary | ICD-10-CM | POA: Insufficient documentation

## 2017-08-06 HISTORY — DX: Mild intermittent asthma, uncomplicated: J45.20

## 2017-08-06 HISTORY — DX: Generalized anxiety disorder: F41.1

## 2017-11-18 DIAGNOSIS — N201 Calculus of ureter: Secondary | ICD-10-CM | POA: Insufficient documentation

## 2017-11-18 HISTORY — DX: Calculus of ureter: N20.1

## 2018-02-24 ENCOUNTER — Other Ambulatory Visit: Payer: Self-pay | Admitting: Neurological Surgery

## 2018-02-24 DIAGNOSIS — M5412 Radiculopathy, cervical region: Secondary | ICD-10-CM

## 2018-03-18 ENCOUNTER — Other Ambulatory Visit: Payer: 59

## 2018-04-08 ENCOUNTER — Ambulatory Visit
Admission: RE | Admit: 2018-04-08 | Discharge: 2018-04-08 | Disposition: A | Discharge status: Home / Self Care | Payer: 59 | Source: Ambulatory Visit | Attending: Neurological Surgery | Admitting: Neurological Surgery

## 2018-04-08 DIAGNOSIS — M5412 Radiculopathy, cervical region: Secondary | ICD-10-CM

## 2018-04-08 IMAGING — XA DG INJECT/[PERSON_NAME] INC NEEDLE/CATH/PLC EPI/CERV/THOR W/IMG
2 series · 2 of 2 positions shown · non-contrast
Comparison: none

CLINICAL DATA: Spondylosis without myelopathy. Previous ACDF. Neck
on the left presently.

[Series 1: ortho adipose · 1 of 1 slices shown (1 of 2)]
[im 1/1]
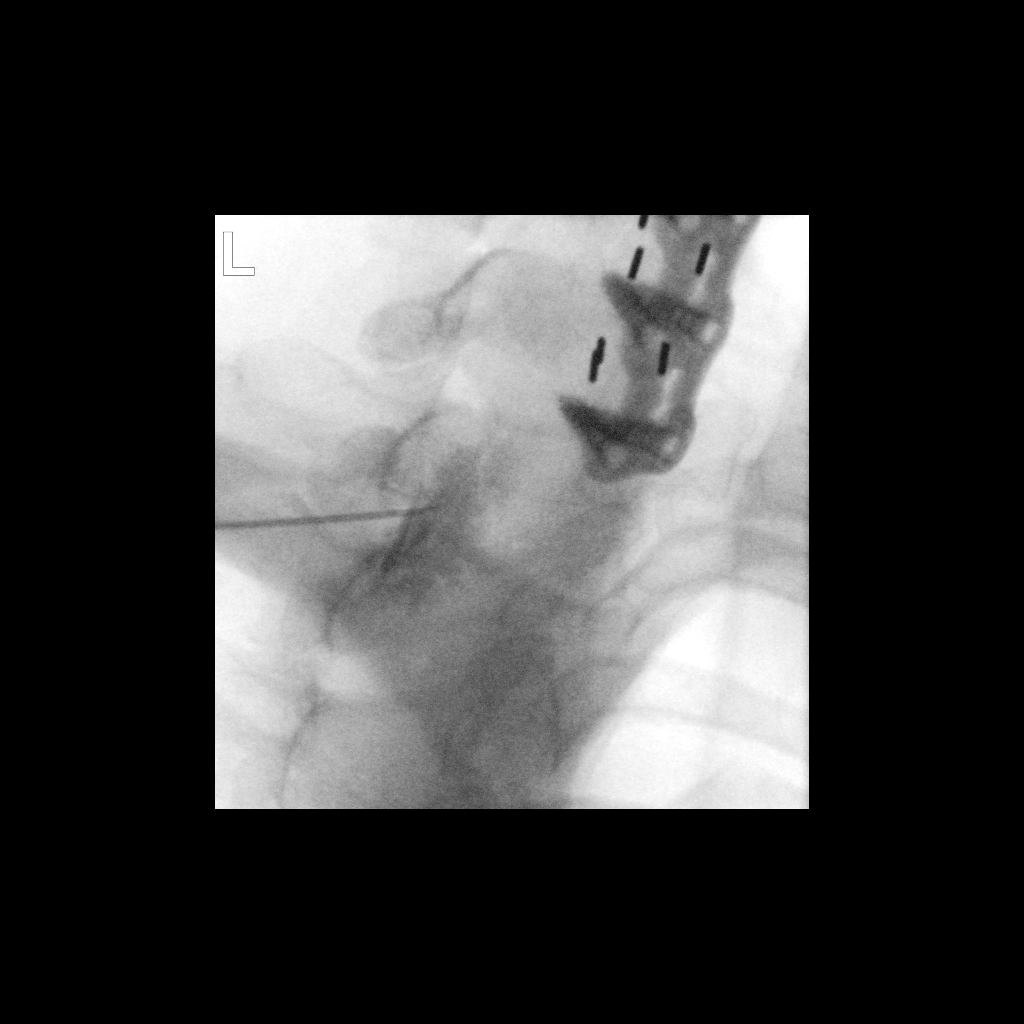

[Series 2: ortho adipose · 1 of 1 slices shown (2 of 2)]
[im 1/1]
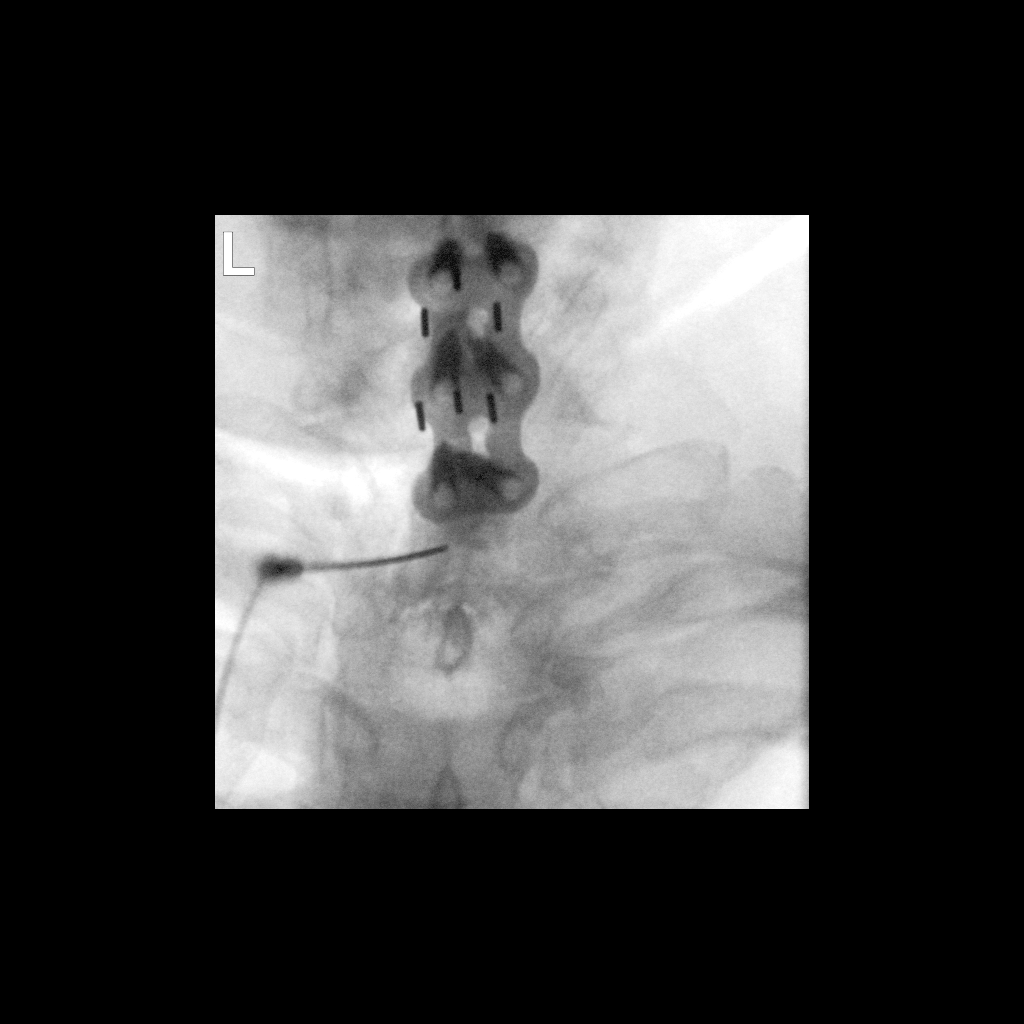

[2 of 2 positions shown; findings below may reference images not displayed]

FLUOROSCOPY TIME:  0 minutes 43 seconds. 440.41 micro gray meter
squared

PROCEDURE:
CERVICAL EPIDURAL INJECTION

An interlaminar approach was performed on the left at C7. A 20 gauge
epidural needle was advanced using loss-of-resistance technique.

DIAGNOSTIC EPIDURAL INJECTION

Injection of Isovue-M 300 shows a good epidural pattern with spread
above and below the level of needle placement, extending to both
sides. No vascular opacification is seen. THERAPEUTIC

EPIDURAL INJECTION

1.5 ml of Kenalog 40 mixed with 1 ml of 1% Lidocaine and 2 ml of
normal saline were then instilled. The procedure was well-tolerated,
and the patient was discharged thirty minutes following the
injection in good condition.
IMPRESSION: Technically successful initial epidural injection on the left at C7.
Good injectate spread to both sides.

## 2018-04-08 MED ORDER — DIAZEPAM 5 MG PO TABS
10.0000 mg | ORAL_TABLET | Freq: Once | ORAL | Status: AC
  Administered 2018-04-08: 10 mg via ORAL

## 2018-04-08 MED ORDER — TRIAMCINOLONE ACETONIDE 40 MG/ML IJ SUSP (RADIOLOGY)
60.0000 mg | Freq: Once | INTRAMUSCULAR | Status: AC
  Administered 2018-04-08: 60 mg via EPIDURAL

## 2018-04-08 MED ORDER — IOPAMIDOL (ISOVUE-M 300) INJECTION 61%
1.0000 mL | Freq: Once | INTRAMUSCULAR | Status: AC | PRN
  Administered 2018-04-08: 1 mL via EPIDURAL

## 2018-04-08 NOTE — Discharge Instructions (Signed)

## 2018-04-19 ENCOUNTER — Other Ambulatory Visit: Payer: Self-pay | Admitting: Neurological Surgery

## 2018-04-19 DIAGNOSIS — M5412 Radiculopathy, cervical region: Secondary | ICD-10-CM

## 2018-04-29 ENCOUNTER — Ambulatory Visit
Admission: RE | Admit: 2018-04-29 | Discharge: 2018-04-29 | Disposition: A | Discharge status: Home / Self Care | Payer: 59 | Source: Ambulatory Visit | Attending: Neurological Surgery | Admitting: Neurological Surgery

## 2018-04-29 DIAGNOSIS — M5412 Radiculopathy, cervical region: Secondary | ICD-10-CM

## 2018-04-29 IMAGING — CT CT CERVICAL SPINE W/O CM
5 series · 16 of 33 positions shown, 18 images · non-contrast
Comparison: MRI cervical spine dated [DATE].

CLINICAL DATA: Chronic posterior neck pain worsened over the past 6
months. Prior ACDF.

EXAM:
CT CERVICAL SPINE WITHOUT CONTRAST
TECHNIQUE: Multidetector CT imaging of the cervical spine was performed without
intravenous contrast. Multiplanar CT image reconstructions were also
generated.

[Series 3: c-spine 2.00 br60 s3 axial bone · axial · 0.30mm/px · z∈[-742,-686]mm · 2 of 85 slices shown]
[im 29/85  bone]
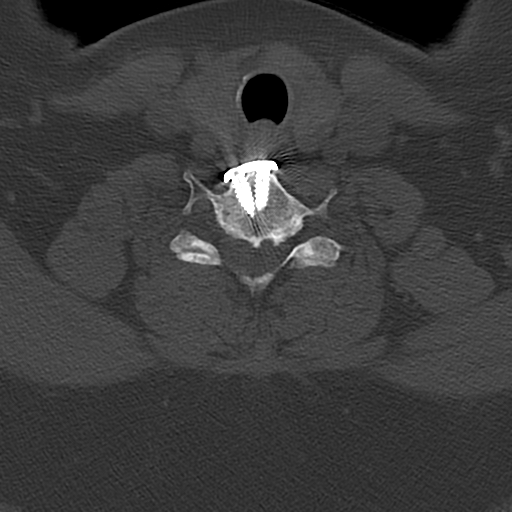
[im 57/85  bone]
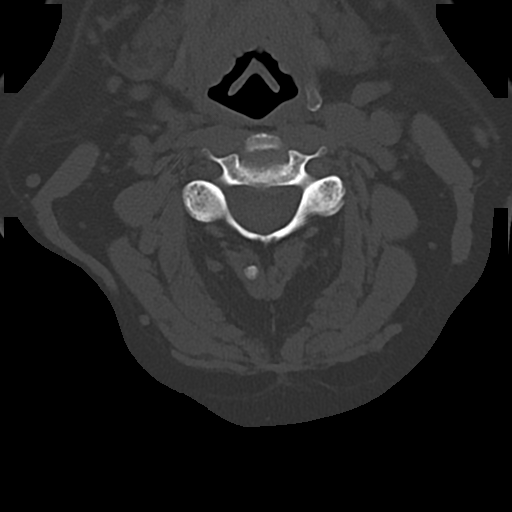

[Series 4: c-spine 2.00 br40 s3 axial (person_name) · axial · 0.30mm/px · z∈[-756,-672]mm · 3 of 85 slices shown]
[im 22/85  bone]
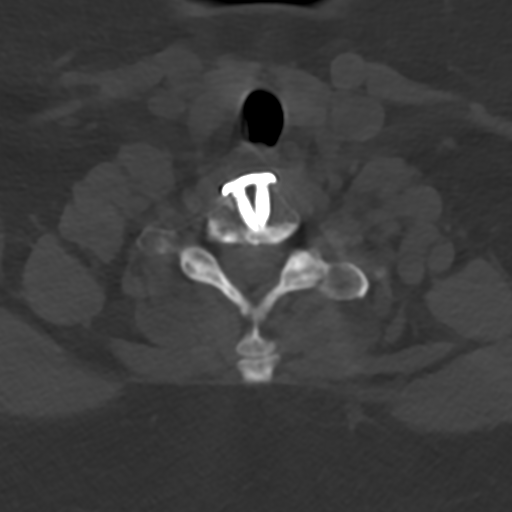
[im 43/85  bone]
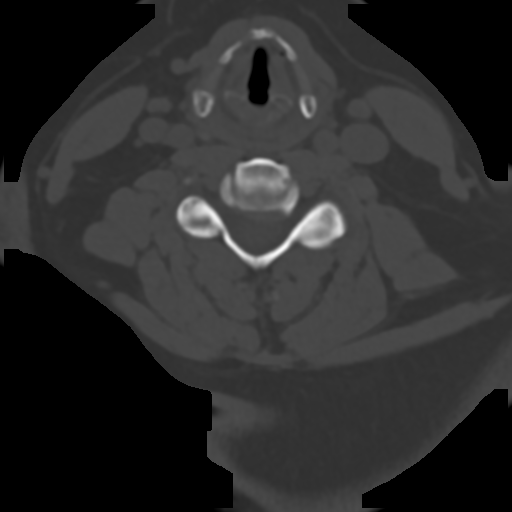
[im 64/85  bone]
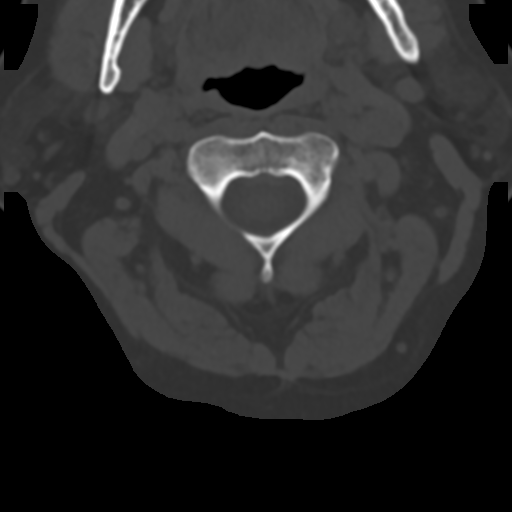

[Series 5: c-spine 2.00 br60 s3 sag sag bone · sagittal · 0.30mm/px · 5 of 76 slices shown, 6 images]
[im 26/76  bone]
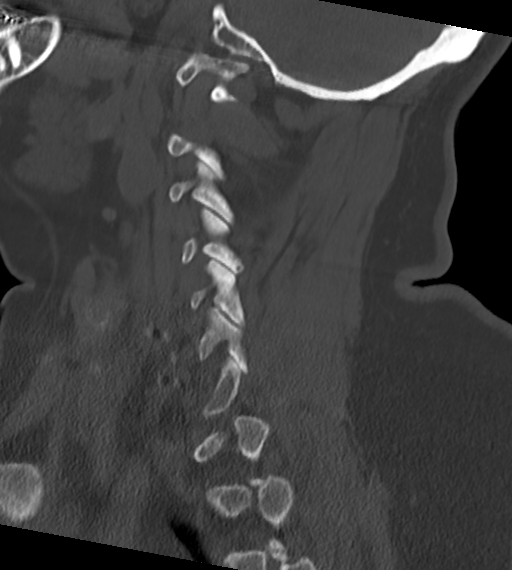
[im 32/76  bone]
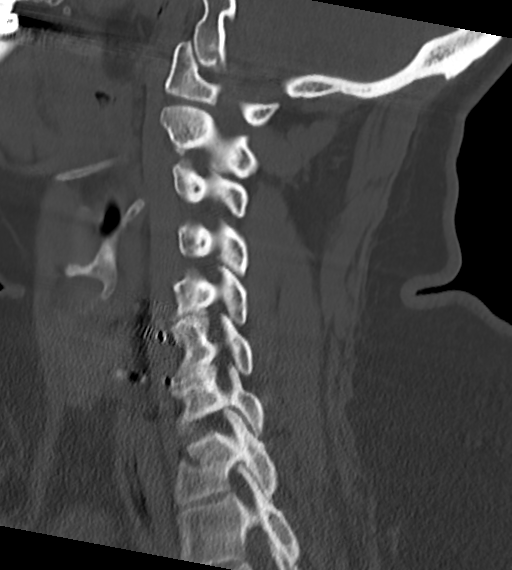
[im 38/76  soft-tissue]
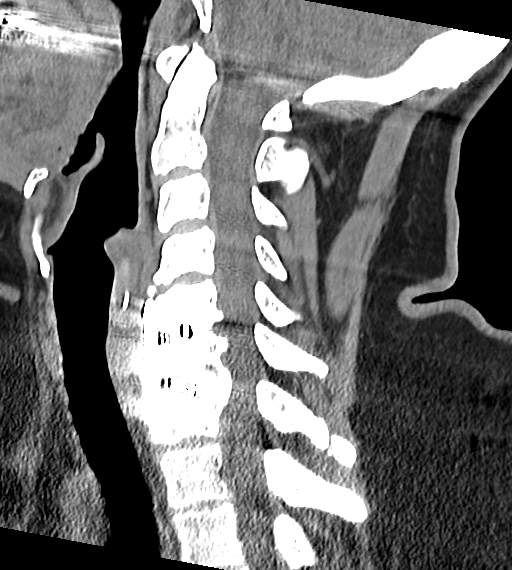
[im 38/76  bone]
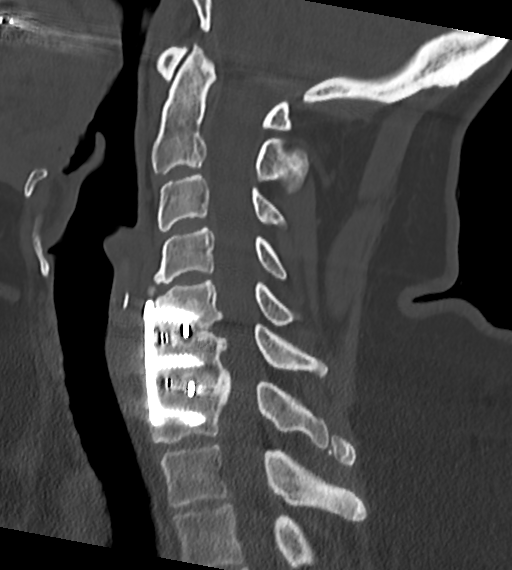
[im 44/76  bone]
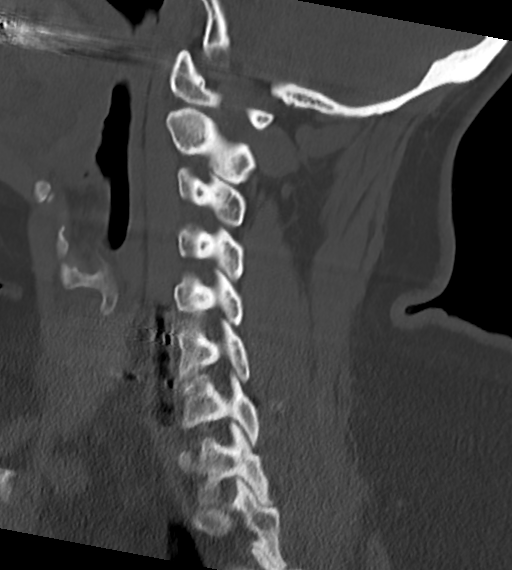
[im 51/76  bone]
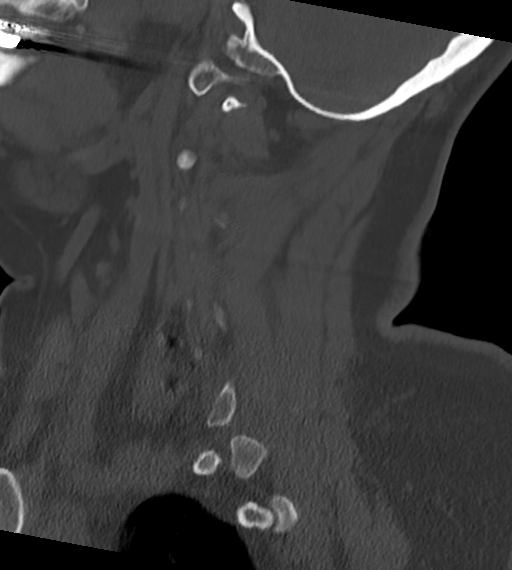

[Series 7: c-spine 2.00 hr60 s3 cor cor bone · coronal · 0.30mm/px · 3 of 76 slices shown]
[im 16/76  bone]
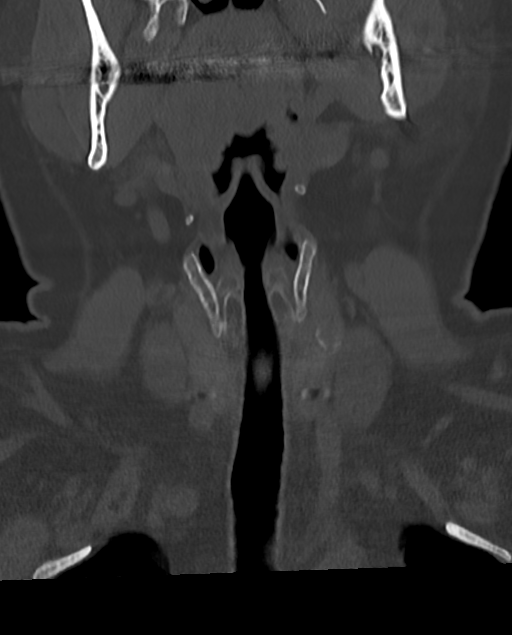
[im 31/76  bone]
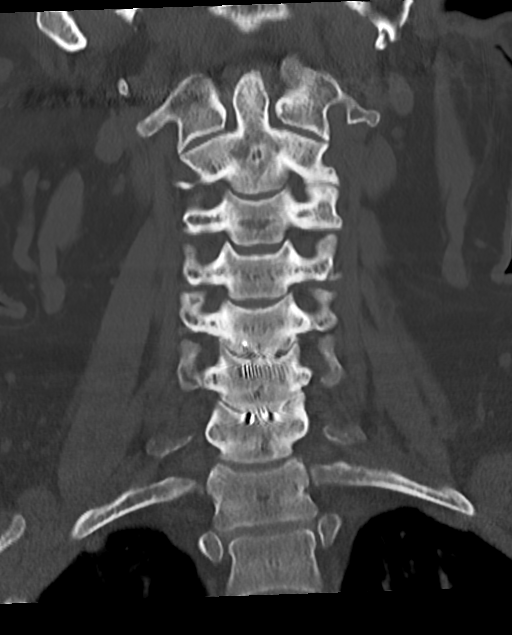
[im 46/76  bone]
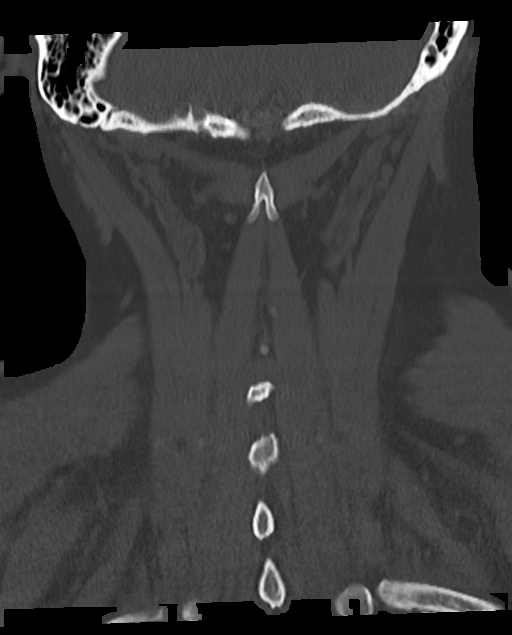

[Series 10: c-spine 2.00 hr60 s3 axial orthogonal axial · axial · 0.30mm/px · z∈[-786,-698]mm · 3 of 88 slices shown, 4 images]
[im 22/88  soft-tissue]
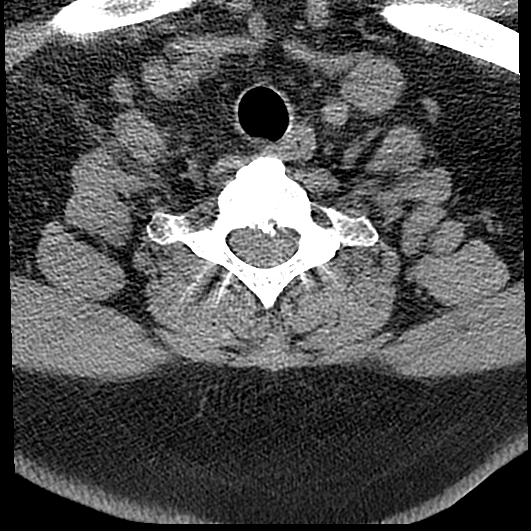
[im 22/88  bone]
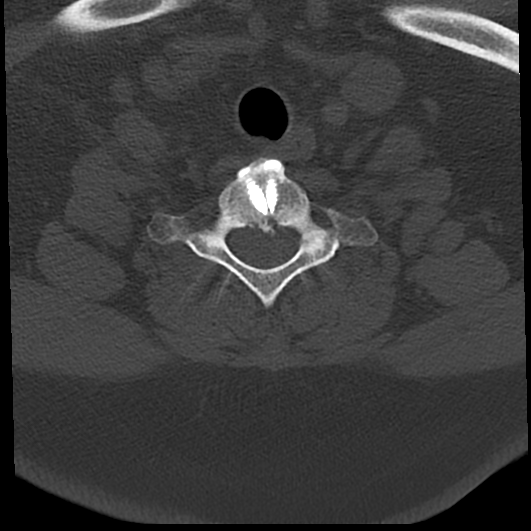
[im 44/88  bone]
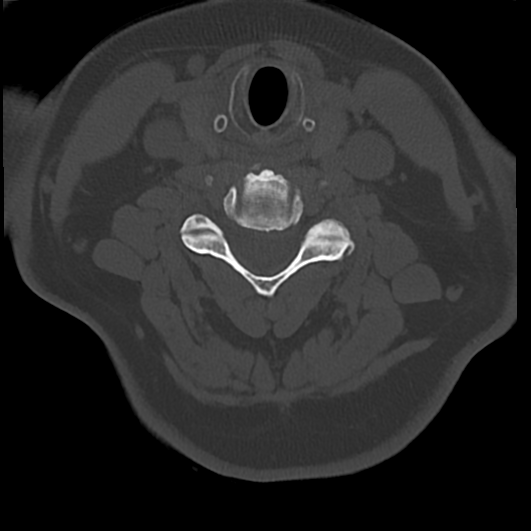
[im 66/88  bone]
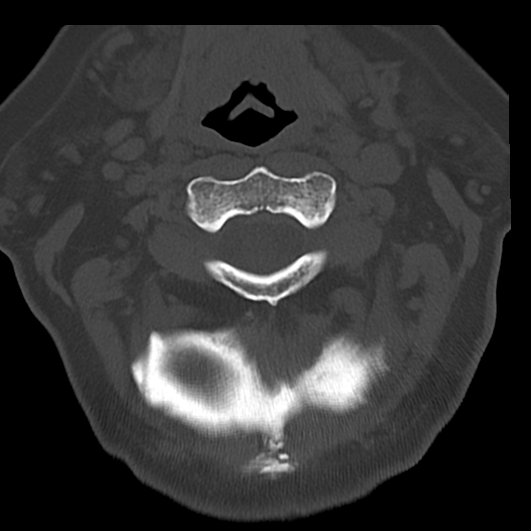

[16 of 33 positions shown; findings below may reference images not displayed]

FINDINGS: Alignment: Straightening of the normal cervical lordosis. Sagittal
alignment is maintained.

Skull base and vertebrae: Prior C5-C7 ACDF with solid interbody
fusion. No evidence of hardware failure or loosening. No acute
fracture. No primary bone lesion or focal pathologic process.

Soft tissues and spinal canal: Normal prevertebral soft tissues.

Disc levels:

C2-C3: Mild left greater than right facet arthropathy. No stenosis.

C3-C4:  Small broad-based disc protrusion.  No stenosis.

C4-C5: Small broad-based disc protrusion slightly eccentric to the
left, effacing the ventral thecal sac. Mild bilateral neuroforaminal
stenosis. No spinal canal stenosis.

C5-C6: Prior ACDF mild residual central spinal canal and bilateral
neuroforaminal stenosis due to posterior endplate and uncovertebral
hypertrophy.

C6-C7: Prior ACDF. Unchanged left paracentral osseous ridging with
mild mass effect on the left ventral cord. Unchanged mild central
spinal canal and left neuroforaminal stenosis.

C7-T1:  Negative.

Upper chest: Negative.

Other: 1.0 cm rim calcified left thyroid nodule.
IMPRESSION: 1. Prior C5-C7 ACDF with mild residual spinal canal and
neuroforaminal stenosis at both levels.
2. Unchanged mild bilateral neuroforaminal stenosis at C4-C5.

## 2018-06-25 DIAGNOSIS — R112 Nausea with vomiting, unspecified: Secondary | ICD-10-CM

## 2018-06-25 HISTORY — DX: Nausea with vomiting, unspecified: R11.2

## 2018-07-05 DIAGNOSIS — R42 Dizziness and giddiness: Secondary | ICD-10-CM | POA: Insufficient documentation

## 2018-07-05 HISTORY — DX: Dizziness and giddiness: R42

## 2018-07-06 DIAGNOSIS — I808 Phlebitis and thrombophlebitis of other sites: Secondary | ICD-10-CM | POA: Insufficient documentation

## 2018-07-06 HISTORY — DX: Phlebitis and thrombophlebitis of other sites: I80.8

## 2018-12-21 DIAGNOSIS — F4310 Post-traumatic stress disorder, unspecified: Secondary | ICD-10-CM | POA: Insufficient documentation

## 2018-12-21 HISTORY — DX: Post-traumatic stress disorder, unspecified: F43.10

## 2019-04-28 ENCOUNTER — Encounter: Payer: Self-pay | Admitting: Family Medicine

## 2019-04-28 ENCOUNTER — Ambulatory Visit: Payer: Self-pay | Attending: Family Medicine | Admitting: Family Medicine

## 2019-04-28 ENCOUNTER — Other Ambulatory Visit: Payer: Self-pay

## 2019-04-28 VITALS — Ht 65.0 in

## 2019-04-28 DIAGNOSIS — K219 Gastro-esophageal reflux disease without esophagitis: Secondary | ICD-10-CM

## 2019-04-28 MED ORDER — FAMOTIDINE 20 MG PO TABS: 20.0000 mg | ORAL_TABLET | Freq: Two times a day (BID) | ORAL | 2 refills | Status: DC

## 2019-04-28 MED ORDER — OMEPRAZOLE 40 MG PO CPDR: DELAYED_RELEASE_CAPSULE | ORAL | 2 refills | Status: DC

## 2019-04-28 NOTE — Progress Notes (Signed)
Virtual Visit via Telephone Note  I connected with Casey Jordan on 04/28/19 at  9:50 AM EDT by telephone and verified that I am speaking with the correct person using two identifiers.   I discussed the limitations, risks, security and privacy concerns of performing an evaluation and management service by telephone and the availability of in person appointments. I also discussed with the patient that there may be a patient responsible charge related to this service. The patient expressed understanding and agreed to proceed.  Patient Location: Home Provider Location: Home Office Others participating in call: none   History of Present Illness:       53 yo female new to the practice who presents secondary to complaint of severe acid reflux.  She reports that she had a relative who died from esophageal cancer related to acid reflux.  Patient states that she follows up regularly with a gastroenterologist in [facility] but cannot recall the name.  She states that she has EGD done every 3 years and also with her recent colonoscopy she had a colon polyp and was told that she would need to repeat her colonoscopy in 3 years.  Patient states that she has been told that she has polyps within her stomach on EGD.  Patient states that she is currently out of her omeprazole twice daily and ranitidine twice daily.  She does have issues with upper mid abdominal discomfort but was also told in the past that she has a hiatal hernia.  Patient denies any difficulty with swallowing or sore throat but does occasionally get backwash of a bad tasting liquid into her mouth/throat especially at night when she sleeping.  Patient states that this will cause her to wake up coughing.  She denies any blood in the stool and no black stools.  No chest pain or palpitations, no shortness of breath or cough.  She is currently uninsured and is having issues with depression and is being followed at [facility] mental health services.  She  would like prescriptions to be sent to their pharmacy.  Past Medical History:  Diagnosis Date  . Anemia   . Asthma   . Depression   . DVT (deep venous thrombosis) (HCC)    lower leg while traveling went to lung  . GERD (gastroesophageal reflux disease)   . Headache    Migraines  . History of kidney stones   . Vaginal Pap smear, abnormal   . Vitamin D insufficiency     Past Surgical History:  Procedure Laterality Date  . CHOLECYSTECTOMY    . COLONOSCOPY    . COLPOSCOPY    . HYSTEROSCOPY W/D&C N/A 04/02/2017   Procedure: DILATATION AND CURETTAGE /HYSTEROSCOPY;  Surgeon: Linda Hedges, DO;  Location: [facility];  Service: Gynecology;  Laterality: N/A;  . KIDNEY STONE SURGERY    . OVARY SURGERY    . POSTERIOR FUSION CERVICAL SPINE    . TONSILLECTOMY      Family History  Problem Relation Age of Onset  . Diabetes Father   . Heart disease Father   . Kidney disease Father   . Diabetes Mother     Social History   Tobacco Use  . Smoking status: Never Smoker  . Smokeless tobacco: Never Used  Substance Use Topics  . Alcohol use: Yes    Comment: social  . Drug use: No     Allergies  Allergen Reactions  . Avocado Other (See Comments)    Stomach pain, cause tongue and throat swelling  .  Codeine Nausea And Vomiting  . Naproxen Swelling  . Sulfa Antibiotics Other (See Comments)    Gets boils  . Penicillins Other (See Comments)     Has patient had a PCN reaction causing immediate rash, facial/tongue/throat swelling, SOB or lightheadedness with hypotension: No Has patient had a PCN reaction causing severe rash involving mucus membranes or skin necrosis: No Has patient had a PCN reaction that required hospitalization No Has patient had a PCN reaction occurring within the last 10 years: No If all of the above answers are "NO", then may proceed with Cephalosporin use.   Marland Kitchen Zofran [Ondansetron] Itching       Observations/Objective: No vital signs or physical exam conducted as  visit was done via telephone  Assessment and Plan: 1. Gastroesophageal reflux disease, unspecified whether esophagitis present Refill sent to patient's pharmacy for omeprazole 40 mg twice daily and Pepcid 20 mg twice daily.  Discussed with the patient that ranitidine is currently on recall.  Patient was asked to make follow-up appointment in 4 to 6 weeks.  Patient was also encouraged to call back to the front desk to receive information so that she can apply to the financial assistance program through this office.  Discussed avoidance of late night eating as well as avoidance of known trigger foods which patient was well aware of due to the longstanding nature of her GI complaints. - omeprazole (PRILOSEC) 40 MG capsule; Take one pill twice per day  Dispense: 60 capsule; Refill: 2 - famotidine (PEPCID) 20 MG tablet; Take 1 tablet (20 mg total) by mouth 2 (two) times daily.  Dispense: 60 tablet; Refill: 2  Follow Up Instructions:Return for GERD/new patient f/u 4-6 weeks .    I discussed the assessment and treatment plan with the patient. The patient was provided an opportunity to ask questions and all were answered. The patient agreed with the plan and demonstrated an understanding of the instructions.   The patient was advised to call back or seek an in-person evaluation if the symptoms worsen or if the condition fails to improve as anticipated.  I provided 18 minutes of non-face-to-face time during this encounter.   Antony Blackbird, MD

## 2019-05-31 ENCOUNTER — Ambulatory Visit: Payer: Self-pay | Admitting: Family Medicine

## 2019-06-08 ENCOUNTER — Other Ambulatory Visit: Payer: Self-pay

## 2019-06-08 ENCOUNTER — Ambulatory Visit: Payer: Self-pay | Attending: Family Medicine | Admitting: Physician Assistant

## 2019-06-08 DIAGNOSIS — J45909 Unspecified asthma, uncomplicated: Secondary | ICD-10-CM

## 2019-06-08 DIAGNOSIS — Z131 Encounter for screening for diabetes mellitus: Secondary | ICD-10-CM

## 2019-06-08 DIAGNOSIS — Z13220 Encounter for screening for lipoid disorders: Secondary | ICD-10-CM

## 2019-06-08 DIAGNOSIS — Z79899 Other long term (current) drug therapy: Secondary | ICD-10-CM

## 2019-06-08 MED ORDER — ALBUTEROL SULFATE HFA 108 (90 BASE) MCG/ACT IN AERS: 2.0000 | INHALATION_SPRAY | Freq: Four times a day (QID) | RESPIRATORY_TRACT | 2 refills | Status: DC | PRN

## 2019-06-08 MED ORDER — IPRATROPIUM-ALBUTEROL 0.5-2.5 (3) MG/3ML IN SOLN: RESPIRATORY_TRACT | 2 refills | Status: DC

## 2019-06-08 NOTE — Progress Notes (Signed)
Virtual Visit via Telephone Note  I connected with [name] on 06/08/19 at  8:50 AM EST by telephone and verified that I am speaking with the correct person using two identifiers.   I discussed the limitations, risks, security and privacy concerns of performing an evaluation and management service by telephone and the availability of in person appointments. I also discussed with the patient that there may be a patient responsible charge related to this service. The patient expressed understanding and agreed to proceed.  Patient location:  home My Location:  [place] office Persons on the call:  Me and the patient   History of Present Illness: Patient is doing well overall but needs some nebules for nebulizer.  Only rarely has to use but needs RF.  No respiratory distress.  No labs in a long time.  No f/c.  Last seen by PCP for reflux and is doing better since starting reflux meds.  Currently not emplyed and looking for work.   Observations/Objective:  NAD.  A&Ox3   Assessment and Plan: 1. Mild asthma, unspecified whether complicated, unspecified whether persistent - albuterol (VENTOLIN HFA) 108 (90 Base) MCG/ACT inhaler; Inhale 2 puffs into the lungs every 6 (six) hours as needed for wheezing or shortness of breath.  Dispense: 18 g; Refill: 2 - ipratropium-albuterol (DUONEB) 0.5-2.5 (3) MG/3ML SOLN; 1 nebule every 4 to 6 hrs prn wheezing  Dispense: 360 mL; Refill: 2 - CBC with Differential; Future  2. Screening for diabetes mellitus I have had a lengthy discussion and provided education about insulin resistance and the intake of too much sugar/refined carbohydrates.  I have advised the patient to work at a goal of eliminating sugary drinks, candy, desserts, sweets, refined sugars, processed foods, and white carbohydrates.  The patient expresses understanding.  - Hemoglobin A1c; Future  3. Screening cholesterol level - Lipid panel; Future  4. High risk medication use - Comprehensive  metabolic panel; Future    Follow Up Instructions: See PCP in 2 months   I discussed the assessment and treatment plan with the patient. The patient was provided an opportunity to ask questions and all were answered. The patient agreed with the plan and demonstrated an understanding of the instructions.   The patient was advised to call back or seek an in-person evaluation if the symptoms worsen or if the condition fails to improve as anticipated.  I provided 15 minutes of non-face-to-face time during this encounter.   Freeman Caldron, PA-C  Patient ID: Casey Jordan, female   DOB: Feb 13, 1966, 53 y.o.   MRN: [id number]

## 2019-06-13 ENCOUNTER — Other Ambulatory Visit: Payer: Self-pay

## 2019-06-20 ENCOUNTER — Other Ambulatory Visit: Payer: Self-pay

## 2019-07-28 ENCOUNTER — Ambulatory Visit: Payer: Self-pay | Admitting: Family Medicine

## 2019-08-09 ENCOUNTER — Other Ambulatory Visit: Payer: Self-pay

## 2019-08-09 ENCOUNTER — Ambulatory Visit: Payer: Self-pay | Attending: Family Medicine | Admitting: Family Medicine

## 2019-08-09 ENCOUNTER — Ambulatory Visit (HOSPITAL_BASED_OUTPATIENT_CLINIC_OR_DEPARTMENT_OTHER): Payer: Self-pay | Admitting: Pharmacist

## 2019-08-09 ENCOUNTER — Encounter: Payer: Self-pay | Admitting: Family Medicine

## 2019-08-09 VITALS — BP 134/82 | HR 92 | Temp 98.2°F | Ht 65.0 in | Wt 232.0 lb

## 2019-08-09 DIAGNOSIS — M549 Dorsalgia, unspecified: Secondary | ICD-10-CM

## 2019-08-09 DIAGNOSIS — N898 Other specified noninflammatory disorders of vagina: Secondary | ICD-10-CM

## 2019-08-09 DIAGNOSIS — Z23 Encounter for immunization: Secondary | ICD-10-CM

## 2019-08-09 DIAGNOSIS — R110 Nausea: Secondary | ICD-10-CM

## 2019-08-09 LAB — POCT GLYCOSYLATED HEMOGLOBIN (HGB A1C): HbA1c, POC (prediabetic range): 6 % (ref 5.7–6.4)

## 2019-08-09 MED ORDER — FLUCONAZOLE 150 MG PO TABS: 150.0000 mg | ORAL_TABLET | Freq: Once | ORAL | 0 refills | Status: AC

## 2019-08-09 MED ORDER — IBUPROFEN 600 MG PO TABS: 600.0000 mg | ORAL_TABLET | Freq: Three times a day (TID) | ORAL | 0 refills | Status: DC | PRN

## 2019-08-09 MED ORDER — PROMETHAZINE HCL 12.5 MG PO TABS: 12.5000 mg | ORAL_TABLET | Freq: Three times a day (TID) | ORAL | 0 refills | Status: DC | PRN

## 2019-08-09 MED ORDER — METHOCARBAMOL 500 MG PO TABS: 500.0000 mg | ORAL_TABLET | Freq: Three times a day (TID) | ORAL | 0 refills | Status: DC | PRN

## 2019-08-09 NOTE — Progress Notes (Signed)
Established Patient Office Visit  Subjective:  Patient ID: Casey Jordan, female    DOB: 1965-09-07  Age: 54 y.o. MRN: [id number]  CC:  Chief Complaint  Patient presents with  . Back Pain  . Vaginal Discharge    HPI Casey Jordan, 54 year old female, who presents secondary to complaint of issues with bilateral mid back pain which she believes may be work related as she does have to do lifting and bending as part of her job.  Back pain is dull and aching and ranges between a 6-10 on a 0-to-10 scale and worse with activity and bending.  Pain stays in the lower back and does not radiate into the buttocks or legs.  She also has complaint of about 1 week of itchy vaginal discharge which has been white and clumpy.  She believes that she is having recurrent yeast infections.  She is also having vaginal itching.  She denies abdominal, pelvic or vaginal pain.  She denies any urinary frequency, urgency or dysuria.  She denies any fever but has had sensation of chills.  She denies any cough or shortness of breath, no chest pain or palpitations.  She does feel as if she has had some headaches but she believes that this is secondary to issues with stress/depression over financial difficulties and the death of her husband.  She also reports that she is having some issues with recurrent nausea.  She denies any possibility of pregnancy.  Past Medical History:  Diagnosis Date  . Anemia   . Asthma   . Depression   . DVT (deep venous thrombosis) (HCC)    lower leg while traveling went to lung  . GERD (gastroesophageal reflux disease)   . Headache    Migraines  . History of kidney stones   . Vaginal Pap smear, abnormal   . Vitamin D insufficiency     Past Surgical History:  Procedure Laterality Date  . CHOLECYSTECTOMY    . COLONOSCOPY    . COLPOSCOPY    . HYSTEROSCOPY WITH D & C N/A 04/02/2017   Procedure: DILATATION AND CURETTAGE /HYSTEROSCOPY;  Surgeon: Linda Hedges, DO;  Location: [facility];   Service: Gynecology;  Laterality: N/A;  . KIDNEY STONE SURGERY    . OVARY SURGERY    . POSTERIOR FUSION CERVICAL SPINE    . TONSILLECTOMY      Family History  Problem Relation Age of Onset  . Diabetes Father   . Heart disease Father   . Kidney disease Father   . Diabetes Mother     Social History   Socioeconomic History  . Marital status: Divorced    Spouse name: Not on file  . Number of children: Not on file  . Years of education: Not on file  . Highest education level: Not on file  Occupational History  . Not on file  Tobacco Use  . Smoking status: Never Smoker  . Smokeless tobacco: Never Used  Substance and Sexual Activity  . Alcohol use: Yes    Comment: social  . Drug use: No  . Sexual activity: Yes    Birth control/protection: None  Other Topics Concern  . Not on file  Social History Narrative  . Not on file   Social Determinants of Health   Financial Resource Strain:   . Difficulty of Paying Living Expenses: Not on file  Food Insecurity:   . Worried About Charity fundraiser in the Last Year: Not on file  . Ran Out  of Food in the Last Year: Not on file  Transportation Needs:   . Lack of Transportation (Medical): Not on file  . Lack of Transportation (Non-Medical): Not on file  Physical Activity:   . Days of Exercise per Week: Not on file  . Minutes of Exercise per Session: Not on file  Stress:   . Feeling of Stress : Not on file  Social Connections:   . Frequency of Communication with Friends and Family: Not on file  . Frequency of Social Gatherings with Friends and Family: Not on file  . Attends Religious Services: Not on file  . Active Member of Clubs or Organizations: Not on file  . Attends Archivist Meetings: Not on file  . Marital Status: Not on file  Intimate Partner Violence:   . Fear of Current or Ex-Partner: Not on file  . Emotionally Abused: Not on file  . Physically Abused: Not on file  . Sexually Abused: Not on file     Outpatient Medications Prior to Visit  Medication Sig Dispense Refill  . albuterol (VENTOLIN HFA) 108 (90 Base) MCG/ACT inhaler Inhale 2 puffs into the lungs every 6 (six) hours as needed for wheezing or shortness of breath. 18 g 2  . famotidine (PEPCID) 20 MG tablet Take 1 tablet (20 mg total) by mouth 2 (two) times daily. 60 tablet 2  . gabapentin (NEURONTIN) 300 MG capsule Take 300 mg by mouth at bedtime.    Marland Kitchen ibuprofen (ADVIL) 200 MG tablet Take 200 mg by mouth every 6 (six) hours as needed.    Marland Kitchen ipratropium-albuterol (DUONEB) 0.5-2.5 (3) MG/3ML SOLN 1 nebule every 4 to 6 hrs prn wheezing 360 mL 2  . LORazepam (ATIVAN) 0.5 MG tablet Take 0.5 mg by mouth 3 (three) times daily.    . meclizine (ANTIVERT) 25 MG tablet Take 25 mg by mouth 3 (three) times daily as needed for dizziness.     . Multiple Vitamin (MULTIVITAMIN WITH MINERALS) TABS tablet Take 1 tablet by mouth daily.    Marland Kitchen omeprazole (PRILOSEC) 40 MG capsule Take one pill twice per day 60 capsule 2  . QUEtiapine (SEROQUEL) 50 MG tablet Take 100 mg by mouth at bedtime.    . ranitidine (ZANTAC) 150 MG tablet Take 150 mg by mouth at bedtime.    Marland Kitchen rOPINIRole (REQUIP) 0.25 MG tablet Take 0.25 mg by mouth at bedtime.    Marland Kitchen buPROPion (WELLBUTRIN XL) 150 MG 24 hr tablet Take 150 mg by mouth daily.    . cyclobenzaprine (FLEXERIL) 5 MG tablet Take 1 tablet (5 mg total) by mouth 3 (three) times daily as needed for muscle spasms. (Patient not taking: Reported on 04/28/2019) 20 tablet 0  . diphenhydrAMINE (BENADRYL) 25 MG tablet Take 50 mg by mouth daily.    Marland Kitchen ibuprofen (ADVIL,MOTRIN) 800 MG tablet Take 1 tablet (800 mg total) by mouth every 6 (six) hours as needed. (Patient not taking: Reported on 04/28/2019) 30 tablet 0  . omeprazole (PRILOSEC) 40 MG capsule Take 40 mg by mouth every evening.     . prazosin (MINIPRESS) 1 MG capsule Take 1 mg by mouth at bedtime.     No facility-administered medications prior to visit.    Allergies  Allergen  Reactions  . Avocado Other (See Comments)    Stomach pain, cause tongue and throat swelling  . Codeine Nausea And Vomiting  . Naproxen Swelling  . Sulfa Antibiotics Other (See Comments)    Gets boils  . Penicillins Other (  See Comments)     Has patient had a PCN reaction causing immediate rash, facial/tongue/throat swelling, SOB or lightheadedness with hypotension: No Has patient had a PCN reaction causing severe rash involving mucus membranes or skin necrosis: No Has patient had a PCN reaction that required hospitalization No Has patient had a PCN reaction occurring within the last 10 years: No If all of the above answers are "NO", then may proceed with Cephalosporin use.   Marland Kitchen Zofran [Ondansetron] Itching    ROS Review of Systems  Constitutional: Positive for chills and fatigue. Negative for fever.  HENT: Negative for sore throat and trouble swallowing.   Gastrointestinal: Negative for abdominal pain, constipation and diarrhea.  Endocrine: Negative for polydipsia, polyphagia and polyuria.  Genitourinary: Positive for vaginal discharge. Negative for dysuria, frequency, pelvic pain and vaginal pain.  Musculoskeletal: Positive for arthralgias and back pain.  Neurological: Positive for headaches. Negative for dizziness.  Hematological: Negative for adenopathy. Does not bruise/bleed easily.  Psychiatric/Behavioral: Negative for self-injury and suicidal ideas.      Objective:    Physical Exam  Constitutional: She is oriented to person, place, and time. She appears well-developed and well-nourished.  Well-nourished well-developed overweight for height/obese female in no acute distress.  She is wearing face mask as per office COVID-19 precautions  Cardiovascular: Normal rate and regular rhythm.  Pulmonary/Chest: Effort normal and breath sounds normal.  Abdominal: Soft. There is no abdominal tenderness. There is no rebound and no guarding.  Genitourinary:    Genitourinary Comments:  Pelvic exam declined.  Patient did cervicovaginal ancillary self swab test per CMA   Musculoskeletal:        General: Tenderness present. No edema.     Comments: Bilateral mid back/CVA area tenderness with increased discomfort with rotation of the torso.  Thoracolumbar paraspinous spasm present  Neurological: She is alert and oriented to person, place, and time.  Psychiatric:  Slightly flattened affect but interacts appropriately.  Slight tearfulness when talking about her late husband  Vitals reviewed.   BP 134/82 (BP Location: Left Arm)   Pulse 92   Temp 98.2 F (36.8 C) (Oral)   Ht 5\' 5"  (1.651 m)   Wt 232 lb (105.2 kg)   BMI 38.61 kg/m  Wt Readings from Last 3 Encounters:  08/09/19 232 lb (105.2 kg)  03/29/17 228 lb (103.4 kg)     Health Maintenance Due  Topic Date Due  . HIV Screening  01/29/1981  . MAMMOGRAM  01/30/2016  . COLONOSCOPY  01/30/2016  . PAP SMEAR-Modifier  10/30/2017  . INFLUENZA VACCINE  02/18/2019    There are no preventive care reminders to display for this patient.  No results found for: TSH Lab Results  Component Value Date   WBC 11.3 (H) 03/29/2017   HGB 12.5 03/29/2017   HCT 37.7 03/29/2017   MCV 88.7 03/29/2017   PLT 467 (H) 03/29/2017   Lab Results  Component Value Date   NA 136 03/29/2017   K 3.9 03/29/2017   CO2 21 (L) 03/29/2017   GLUCOSE 88 03/29/2017   BUN 15 03/29/2017   CREATININE 0.73 03/29/2017   BILITOT 0.6 08/19/2014   ALKPHOS 65 08/19/2014   AST 52 (H) 08/19/2014   ALT 50 (H) 08/19/2014   PROT 7.3 08/19/2014   ALBUMIN 3.9 08/19/2014   CALCIUM 9.5 03/29/2017   ANIONGAP 13 03/29/2017   No results found for: CHOL No results found for: HDL No results found for: LDLCALC No results found for: TRIG No results found  for: CHOLHDL No results found for: HGBA1C    Assessment & Plan:  1. Acute mid back pain Patient with complaint of acute mid back pain.  She will have urinalysis to look for possible urinary tract  infection.  Prescription was provided for ibuprofen to take as needed for pain and she is aware that she should eat prior to taking the medication.  Additionally prescription provided for Robaxin to take as needed for muscle spasm.  Referral for social work as patient with current issues with financial difficulty. - POCT URINALYSIS DIP (CLINITEK) - ibuprofen (ADVIL) 600 MG tablet; Take 1 tablet (600 mg total) by mouth every 8 (eight) hours as needed. For pain  Dispense: 60 tablet; Refill: 0 - methocarbamol (ROBAXIN) 500 MG tablet; Take 1 tablet (500 mg total) by mouth every 8 (eight) hours as needed for muscle spasms.  Dispense: 60 tablet; Refill: 0 - Ambulatory referral to Social Work  2. Vaginal discharge Patient with vaginal discharge and she has complaint of recurrent yeast infections.  Cervicovaginal ancillary testing done at today's visit and in the interim, prescription provided for Diflucan in case of current vaginal yeast infection.  We will also do hemoglobin A1c to see if patient may have prediabetes or diabetes as a contributing factor to her recurrent vaginal infections. - HgB A1c - fluconazole (DIFLUCAN) 150 MG tablet; Take 1 tablet (150 mg total) by mouth once for 1 dose.  Dispense: 1 tablet; Refill: 0 - Cervicovaginal ancillary only  3. Nausea Patient with complaint of recurrent nausea.  She denies any possible pregnancy and is currently postmenopausal.  Prescription provided for Phenergan which patient states that she has taken in the past to help with nausea.  Will check urinalysis to look for urinary tract infection as patient also has mid back pain.  Hemoglobin A1c to look for diabetes or prediabetes.  She reports that she has some acid reflux medicine at home that she has taken in the past and she will resume use of the medication.  She should call or return in the next 1 to 2 weeks if her symptoms do not improve.  If she has any acute worsening she should go to urgent care or  emergency department for further evaluation. - POCT URINALYSIS DIP (CLINITEK) - HgB A1c - promethazine (PHENERGAN) 12.5 MG tablet; Take 1 tablet (12.5 mg total) by mouth every 8 (eight) hours as needed for nausea or vomiting.  Dispense: 20 tablet; Refill: 0  4. Need for immunization against influenza She was offered and agreed to have influenza immunization at today's visit which was provided as part of nurse visit.  See nurses note.  An After Visit Summary was printed and given to the patient.  Follow-up: Return for back pain- 2 weeks if not better.    Antony Blackbird, MD

## 2019-08-09 NOTE — Progress Notes (Signed)
C /o white cheese vaginal discharge with itchy sensation X 1 wk. Little improvement with OTC meds.Localized bilateral  Mid back pain X 2 wks .No burning on urination or urinary discomfort.  Denies any fever. States some chills on and off. Pain 7/10

## 2019-08-10 ENCOUNTER — Telehealth: Payer: Self-pay | Admitting: Family Medicine

## 2019-08-10 NOTE — Telephone Encounter (Signed)
Patient stated she had a reaction to the fluconazole. Patient stated that her upper lip is super swollen. Patient stated that the reaction started 30 minutes after she took medication. Patient stated she took medication around 6pm yesterday 08/09/2019. Patient stated that she is going to the store to get benadryl and to buy something OTC for yeast infection. Please fu at your earliest convenience.

## 2019-08-10 NOTE — Telephone Encounter (Signed)
Left message on voicemail to return call.

## 2019-08-11 ENCOUNTER — Encounter: Payer: Self-pay | Admitting: *Deleted

## 2019-08-11 NOTE — Progress Notes (Signed)
Patient presents for vaccination against influenza per orders of Dr. Fulp. Consent given. Counseling provided. No contraindications exists. Vaccine administered without incident.   

## 2019-08-11 NOTE — Telephone Encounter (Addendum)
Patient called office for advice about allergic reaction to fluconazole. Denies SOB, throat discomfort.  She states she also has swelling in vaginal area as well.  Patient was advised by PCP to go to an Urgent Care.

## 2019-08-15 LAB — CERVICOVAGINAL ANCILLARY ONLY
Bacterial Vaginitis (gardnerella): NEGATIVE
Candida Glabrata: POSITIVE — AB
Candida Vaginitis: POSITIVE — AB
Chlamydia: NEGATIVE
Comment: NEGATIVE
Comment: NEGATIVE
Comment: NEGATIVE
Comment: NEGATIVE
Comment: NEGATIVE
Comment: NORMAL
Neisseria Gonorrhea: NEGATIVE
Trichomonas: NEGATIVE

## 2019-08-16 ENCOUNTER — Telehealth: Payer: Self-pay | Admitting: *Deleted

## 2019-08-16 NOTE — Telephone Encounter (Signed)
Patient verified DOB Patient is aware of yeast being present and has picked up monistat.  Patient has an appointment on 2/3 and would like routine blood work completed then.

## 2019-08-16 NOTE — Telephone Encounter (Signed)
Medical Assistant left message on patient's home and cell voicemail. Voicemail states to give a call back to Clavin Ruhlman with [facility] at [phone].  

## 2019-08-16 NOTE — Telephone Encounter (Signed)
-----   Message from Antony Blackbird, MD sent at 08/15/2019  4:34 PM EST ----- Cervicovaginal ancillary testing is positive for yeast infection but since patient may have had a reaction to the use of diflucan, she may wish to try otc monistat or other otc yeast treatment

## 2019-08-23 ENCOUNTER — Ambulatory Visit: Payer: Self-pay | Admitting: Family Medicine

## 2019-08-25 ENCOUNTER — Telehealth: Payer: Self-pay | Admitting: Licensed Clinical Social Worker

## 2019-08-25 NOTE — Telephone Encounter (Signed)
Call placed to patient regarding [facility] referral. LCSW left message requesting a return call.  

## 2019-08-28 ENCOUNTER — Telehealth: Payer: Self-pay | Admitting: Licensed Clinical Social Worker

## 2019-08-28 NOTE — Telephone Encounter (Signed)
LCSW placed return call to patient; however, there was no answer. LCSW left detailed message.

## 2019-09-13 ENCOUNTER — Other Ambulatory Visit: Payer: Self-pay | Admitting: Family Medicine

## 2019-09-13 DIAGNOSIS — K219 Gastro-esophageal reflux disease without esophagitis: Secondary | ICD-10-CM

## 2019-09-28 ENCOUNTER — Ambulatory Visit: Payer: Self-pay

## 2019-10-04 ENCOUNTER — Emergency Department (HOSPITAL_COMMUNITY)
Admission: EM | Admit: 2019-10-04 | Discharge: 2019-10-04 | Disposition: A | Discharge status: Home / Self Care | Payer: No Typology Code available for payment source | Attending: Emergency Medicine | Admitting: Emergency Medicine

## 2019-10-04 ENCOUNTER — Emergency Department (HOSPITAL_COMMUNITY): Payer: No Typology Code available for payment source

## 2019-10-04 ENCOUNTER — Other Ambulatory Visit: Payer: Self-pay

## 2019-10-04 ENCOUNTER — Encounter (HOSPITAL_COMMUNITY): Payer: Self-pay | Admitting: *Deleted

## 2019-10-04 DIAGNOSIS — J45909 Unspecified asthma, uncomplicated: Secondary | ICD-10-CM | POA: Insufficient documentation

## 2019-10-04 DIAGNOSIS — Y92410 Unspecified street and highway as the place of occurrence of the external cause: Secondary | ICD-10-CM | POA: Diagnosis not present

## 2019-10-04 DIAGNOSIS — M542 Cervicalgia: Secondary | ICD-10-CM

## 2019-10-04 DIAGNOSIS — Y999 Unspecified external cause status: Secondary | ICD-10-CM | POA: Diagnosis not present

## 2019-10-04 DIAGNOSIS — Y939 Activity, unspecified: Secondary | ICD-10-CM | POA: Diagnosis not present

## 2019-10-04 DIAGNOSIS — Z86718 Personal history of other venous thrombosis and embolism: Secondary | ICD-10-CM | POA: Insufficient documentation

## 2019-10-04 IMAGING — CT CT CERVICAL SPINE W/O CM
3 of 4 series · 13 of 33 positions shown, 16 images · non-contrast
Comparison: [DATE]

CLINICAL DATA: Neck pain.

EXAM:
CT CERVICAL SPINE WITHOUT CONTRAST
TECHNIQUE: Multidetector CT imaging of the cervical spine was performed without
intravenous contrast. Multiplanar CT image reconstructions were also
generated.

[Series 6: sagittal bone · sagittal · 0.28mm/px · 5 of 61 slices shown, 6 images]
[im 21/61  bone]
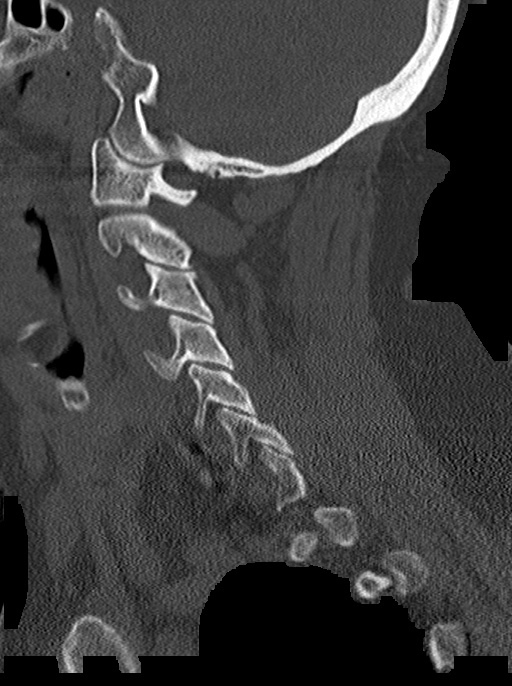
[im 26/61  bone]
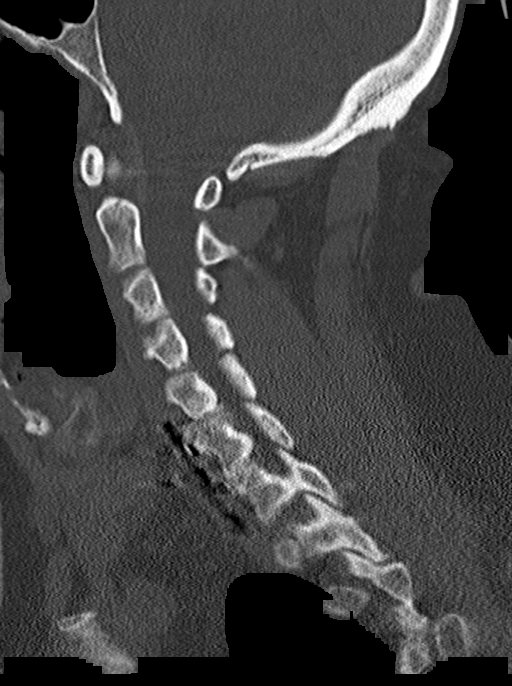
[im 31/61  soft-tissue]
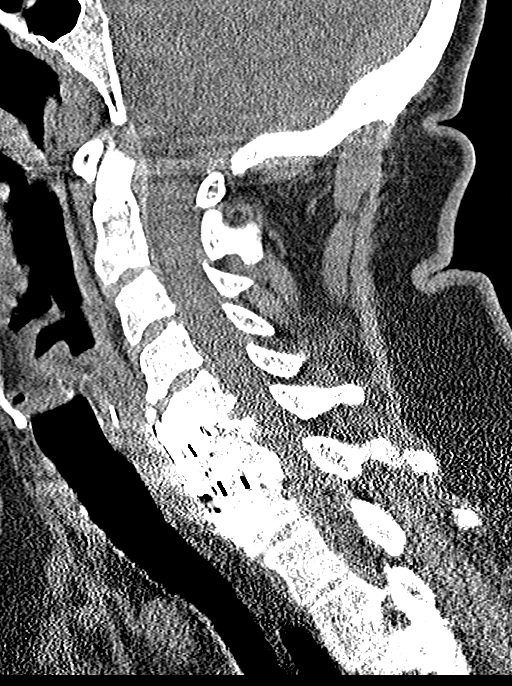
[im 31/61  bone]
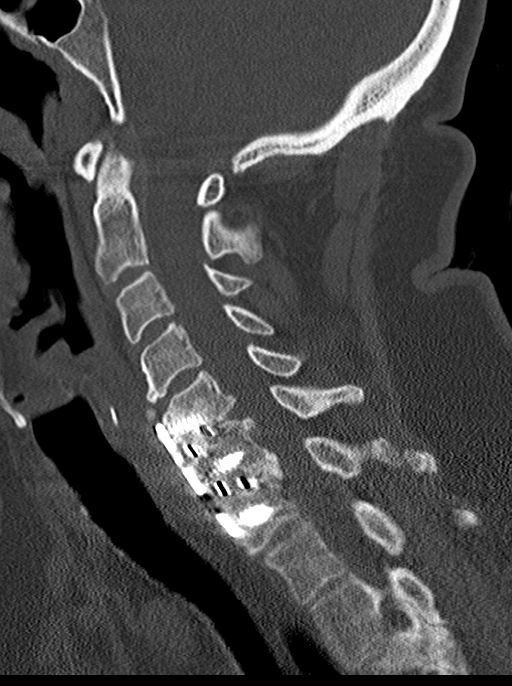
[im 36/61  bone]
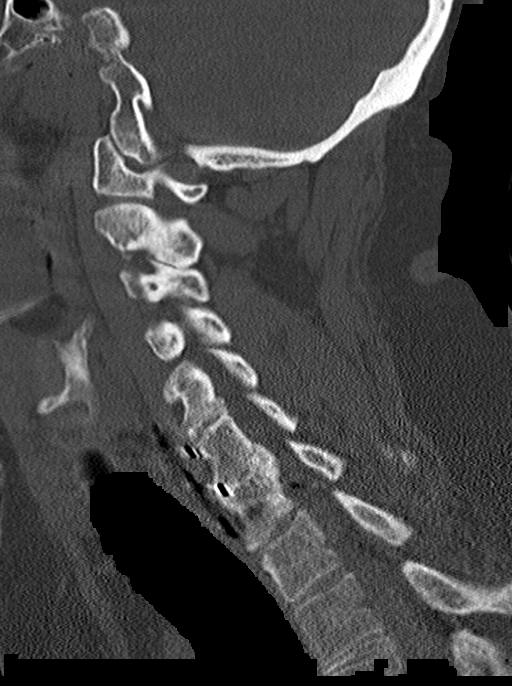
[im 41/61  bone]
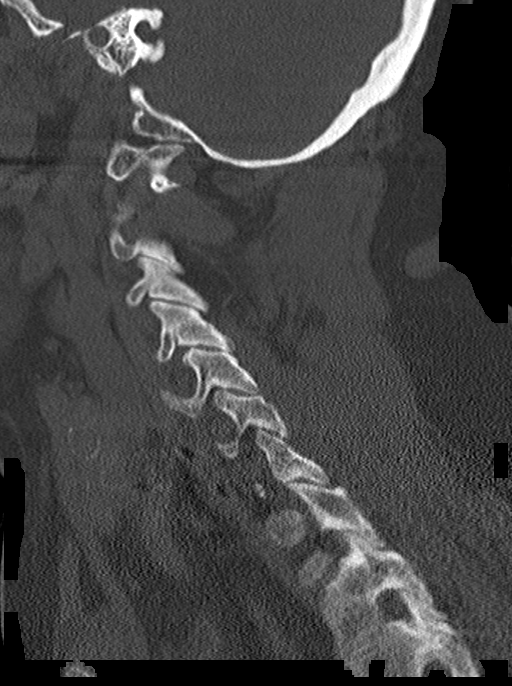

[Series 7: coronal bone · coronal · 0.28mm/px · 3 of 61 slices shown]
[im 13/61  bone]
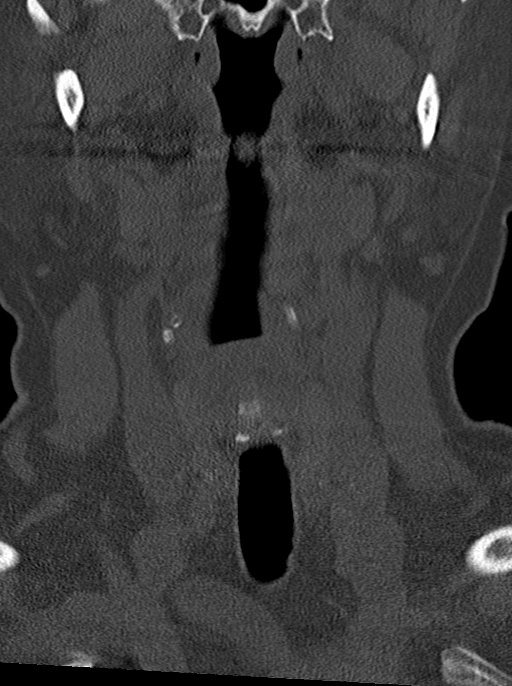
[im 25/61  bone]
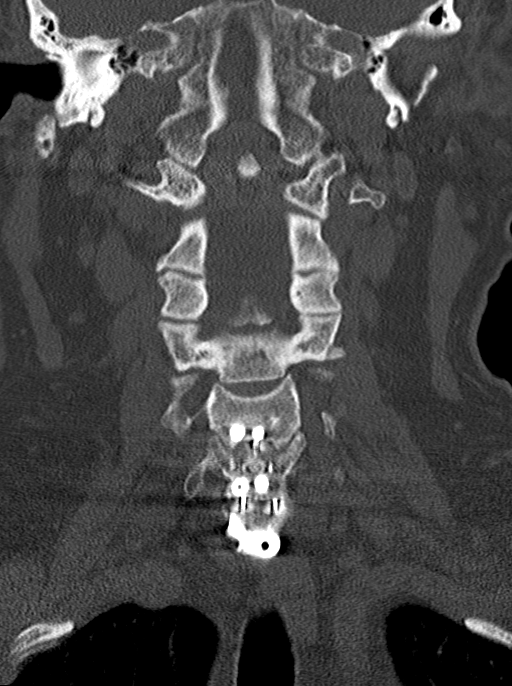
[im 37/61  bone]
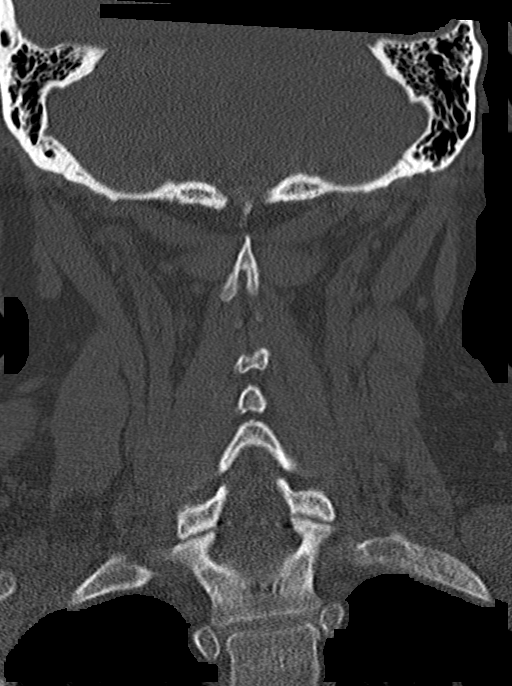

[Series 9: orthogonal bone · axial · 0.21mm/px · z∈[+1362,+1469]mm · 5 of 107 slices shown, 7 images]
[im 18/107  soft-tissue]
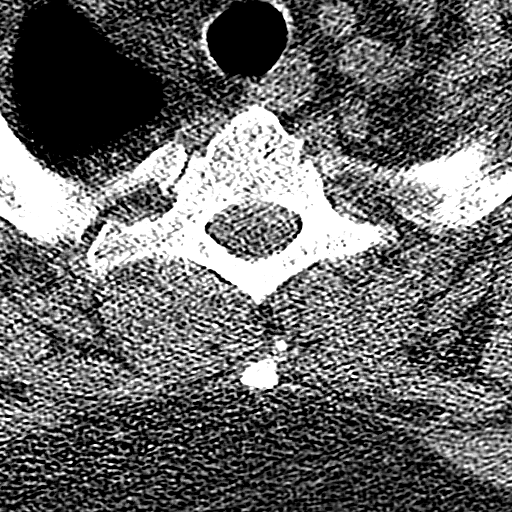
[im 18/107  bone]
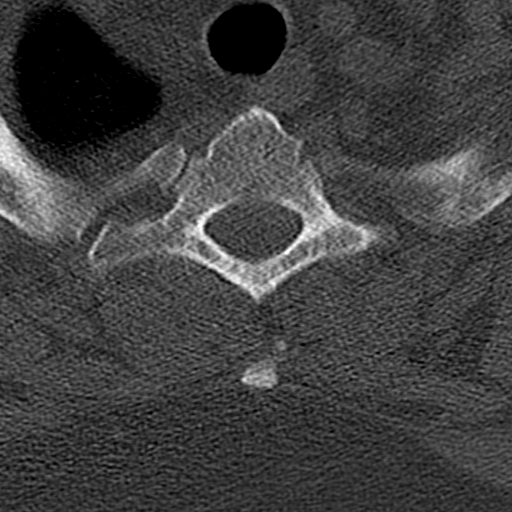
[im 36/107  bone]
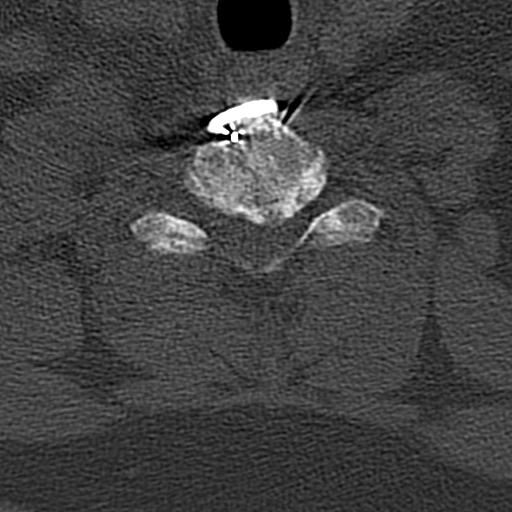
[im 54/107  bone]
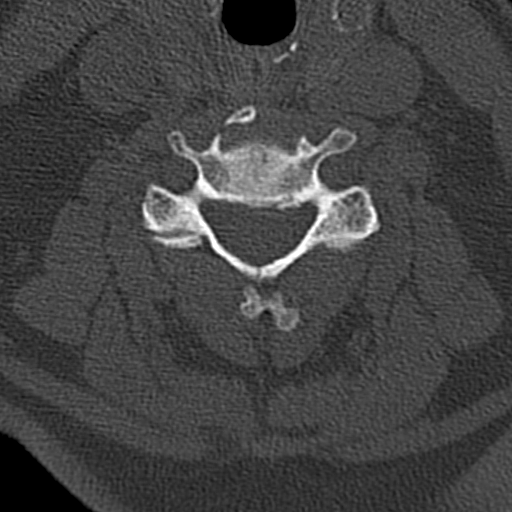
[im 71/107  bone]
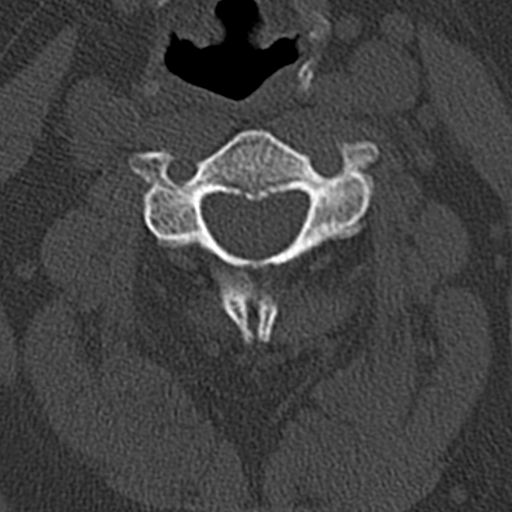
[im 89/107  soft-tissue]
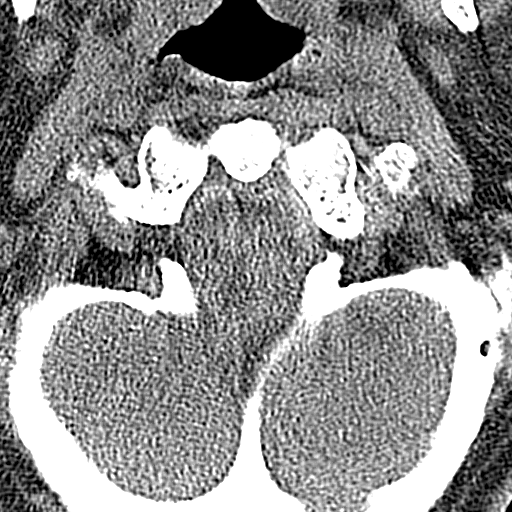
[im 89/107  bone]
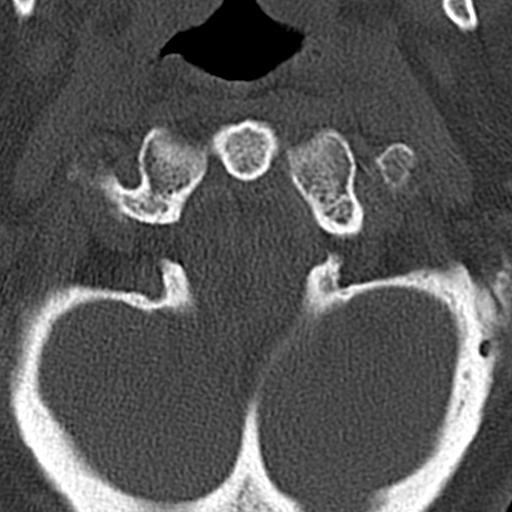

[13 of 33 positions shown; findings below may reference images not displayed]

FINDINGS: Alignment: Normal.

Skull base and vertebrae: Patient is status post prior fusion from
C5 through C7. There are interbody spacers at the C5-C6 and C6-C7
levels. There is no acute displaced fracture.

Soft tissues and spinal canal: There is persistent spinal canal
narrowing at the level of the ACDF. Osseous neural foraminal
narrowing is noted at the C5-C6 level bilaterally.

Disc levels:  There is mild disc height loss at the C4-C5 level.

Upper chest: Negative.

Other: None.
IMPRESSION: 1. No acute abnormality.
2. Postsurgical changes of the cervical spine as detailed above with
a relatively stable appearance since [DATE].

## 2019-10-04 MED ORDER — CYCLOBENZAPRINE HCL 10 MG PO TABS: 10.0000 mg | ORAL_TABLET | Freq: Two times a day (BID) | ORAL | 0 refills | Status: DC | PRN

## 2019-10-04 MED ORDER — DIAZEPAM 5 MG PO TABS
5.0000 mg | ORAL_TABLET | Freq: Once | ORAL | Status: AC
  Administered 2019-10-04: 5 mg via ORAL
  Filled 2019-10-04: qty 1

## 2019-10-04 MED ORDER — IBUPROFEN 600 MG PO TABS: 600.0000 mg | ORAL_TABLET | Freq: Three times a day (TID) | ORAL | 0 refills | Status: DC

## 2019-10-04 NOTE — Discharge Instructions (Signed)
Wear collar for comfort. Refer to attached instructions.
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
## 2019-10-04 NOTE — ED Notes (Signed)
Pt transported to CT ?
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
## 2019-10-12 ENCOUNTER — Encounter: Payer: Self-pay | Admitting: Family

## 2019-10-12 ENCOUNTER — Other Ambulatory Visit: Payer: Self-pay

## 2019-10-12 ENCOUNTER — Ambulatory Visit: Payer: Self-pay | Attending: Family | Admitting: Family

## 2019-10-12 VITALS — BP 122/76 | HR 118 | Temp 98.6°F | Resp 16 | Ht 64.5 in | Wt 239.6 lb

## 2019-10-12 DIAGNOSIS — M542 Cervicalgia: Secondary | ICD-10-CM

## 2019-10-12 MED ORDER — CYCLOBENZAPRINE HCL 10 MG PO TABS: 10.0000 mg | ORAL_TABLET | Freq: Two times a day (BID) | ORAL | 0 refills | Status: DC | PRN
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
## 2019-10-12 NOTE — Patient Instructions (Signed)
Referral to orthopedics. Continue Ibuprofen and Flexeril for pain management. Follow-up with primary physician as needed.  Cervical Radiculopathy  Cervical radiculopathy happens when a nerve in the neck (a cervical nerve) is pinched or bruised. This condition can happen because of an injury to the cervical spine (vertebrae) in the neck, or as part of the normal aging process. Pressure on the cervical nerves can cause pain or numbness that travels from the neck all the way down into the arm and fingers. Usually, this condition gets better with rest. Treatment may be needed if the condition does not improve. What are the causes? This condition may be caused by:  A neck injury.  A bulging (herniated) disk.  Muscle spasms.  Muscle tightness in the neck because of overuse.  Arthritis.  Breakdown or degeneration in the bones and joints of the spine (spondylosis) due to aging.  Bone spurs that may develop near the cervical nerves. What are the signs or symptoms? Symptoms of this condition include:  Pain. The pain may travel from the neck to the arm and hand. The pain can be severe or irritating. It may be worse when you move your neck.  Numbness or tingling in your arm or hand.  Weakness in the affected arm and hand, in severe cases. How is this diagnosed? This condition may be diagnosed based on your symptoms, your medical history, and a physical exam. You may also have tests, including:  X-rays.  A CT scan.  An MRI.  An electromyogram (EMG).  Nerve conduction tests. How is this treated? In many cases, treatment is not needed for this condition. With rest, the condition usually gets better over time. If treatment is needed, options may include:  Wearing a soft neck collar (cervical collar) for short periods of time, as told by your health care provider.  Doing physical therapy to strengthen your neck muscles.  Taking medicines, such as NSAIDs or oral  corticosteroids.  Having spinal injections, in severe cases.  Having surgery. This may be needed if other treatments do not help. Different types of surgery may be done depending on the cause of this condition. Follow these instructions at home: If you have a cervical collar:  Wear it as told by your health care provider. Remove it only as told by your health care provider.  Ask your health care provider if you can remove the collar for cleaning and bathing. If you are allowed to remove the collar for cleaning or bathing: ? Follow instructions from your health care provider about how to remove the collar safely. ? Clean the collar by wiping it with mild soap and water and drying it completely. ? Take out any removable pads in the collar every 1-2 days, and wash them by hand with soap and water. Let them air-dry completely before you put them back in the collar. ? Check your skin under the collar for irritation or sores. If you see any, tell your health care provider. Managing pain      Take over-the-counter and prescription medicines only as told by your health care provider.  If directed, put ice on the affected area. ? If you have a soft neck collar, remove it as told by your health care provider. ? Put ice in a plastic bag. ? Place a towel between your skin and the bag. ? Leave the ice on for 20 minutes, 2-3 times a day.  If applying ice does not help, you can try using heat. Use the heat  source that your health care provider recommends, such as a moist heat pack or a heating pad. ? Place a towel between your skin and the heat source. ? Leave the heat on for 20-30 minutes. ? Remove the heat if your skin turns bright red. This is especially important if you are unable to feel pain, heat, or cold. You may have a greater risk of getting burned.  Try a gentle neck and shoulder massage to help relieve symptoms. Activity  Rest as needed.  Return to your normal activities as told by  your health care provider. Ask your health care provider what activities are safe for you.  Do stretching and strengthening exercises as told by your health care provider or physical therapist.  Do not lift anything that is heavier than 10 lb (4.5 kg) until your health care provider tells you that it is safe. General instructions  Use a flat pillow when you sleep.  Do not drive while wearing a cervical collar. If you do not have a cervical collar, ask your health care provider if it is safe to drive while your neck heals.  Ask your health care provider if the medicine prescribed to you requires you to avoid driving or using heavy machinery.  Do not use any products that contain nicotine or tobacco, such as cigarettes, e-cigarettes, and chewing tobacco. These can delay healing. If you need help quitting, ask your health care provider.  Keep all follow-up visits as told by your health care provider. This is important. Contact a health care provider if:  Your condition does not improve with treatment. Get help right away if:  Your pain gets much worse and cannot be controlled with medicines.  You have weakness or numbness in your hand, arm, face, or leg.  You have a high fever.  You have a stiff, rigid neck.  You lose control of your bowels or your bladder (have incontinence).  You have trouble with walking, balance, or speaking. Summary  Cervical radiculopathy happens when a nerve in the neck is pinched or bruised.  A nerve can get pinched from a bulging disk, arthritis, muscle spasms, or an injury to the neck.  Symptoms include pain, tingling, or numbness radiating from the neck into the arm or hand. Weakness can also occur in severe cases.  Treatment may include rest, wearing a cervical collar, and physical therapy. Medicines may be prescribed to help with pain. In severe cases, injections or surgery may be needed. This information is not intended to replace advice given to  you by your health care provider. Make sure you discuss any questions you have with your health care provider. Document Revised: 05/27/2018 Document Reviewed: 05/27/2018 Elsevier Patient Education  2020 [employer].

## 2019-10-16 ENCOUNTER — Other Ambulatory Visit: Payer: Self-pay | Admitting: Family

## 2019-11-23 ENCOUNTER — Ambulatory Visit: Payer: Self-pay | Admitting: Family Medicine

## 2019-12-27 ENCOUNTER — Other Ambulatory Visit: Payer: Self-pay

## 2019-12-27 ENCOUNTER — Ambulatory Visit (INDEPENDENT_AMBULATORY_CARE_PROVIDER_SITE_OTHER): Payer: No Payment, Other | Admitting: Clinical

## 2019-12-27 DIAGNOSIS — F4310 Post-traumatic stress disorder, unspecified: Secondary | ICD-10-CM | POA: Diagnosis not present

## 2019-12-27 DIAGNOSIS — F411 Generalized anxiety disorder: Secondary | ICD-10-CM

## 2019-12-27 DIAGNOSIS — F331 Major depressive disorder, recurrent, moderate: Secondary | ICD-10-CM

## 2019-12-28 DIAGNOSIS — F331 Major depressive disorder, recurrent, moderate: Secondary | ICD-10-CM

## 2019-12-28 HISTORY — DX: Major depressive disorder, recurrent, moderate: F33.1
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
## 2019-12-29 ENCOUNTER — Telehealth (INDEPENDENT_AMBULATORY_CARE_PROVIDER_SITE_OTHER): Payer: No Payment, Other | Admitting: Psychiatric/Mental Health

## 2019-12-29 ENCOUNTER — Encounter (HOSPITAL_COMMUNITY): Payer: Self-pay | Admitting: Psychiatric/Mental Health

## 2019-12-29 ENCOUNTER — Other Ambulatory Visit: Payer: Self-pay

## 2019-12-29 DIAGNOSIS — F411 Generalized anxiety disorder: Secondary | ICD-10-CM | POA: Diagnosis not present

## 2019-12-29 DIAGNOSIS — F4310 Post-traumatic stress disorder, unspecified: Secondary | ICD-10-CM | POA: Diagnosis not present

## 2019-12-29 DIAGNOSIS — F331 Major depressive disorder, recurrent, moderate: Secondary | ICD-10-CM | POA: Diagnosis not present

## 2019-12-29 MED ORDER — GABAPENTIN 300 MG PO CAPS: 300.0000 mg | ORAL_CAPSULE | Freq: Every day | ORAL | 2 refills | Status: DC

## 2019-12-29 MED ORDER — BUSPIRONE HCL 10 MG PO TABS: 10.0000 mg | ORAL_TABLET | Freq: Three times a day (TID) | ORAL | 2 refills | Status: AC

## 2019-12-29 MED ORDER — QUETIAPINE FUMARATE 50 MG PO TABS: 100.0000 mg | ORAL_TABLET | Freq: Every day | ORAL | 2 refills | Status: DC

## 2019-12-29 MED ORDER — PAROXETINE HCL 20 MG PO TABS: 20.0000 mg | ORAL_TABLET | Freq: Every day | ORAL | 0 refills | Status: DC
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
## 2020-01-10 ENCOUNTER — Other Ambulatory Visit: Payer: Self-pay

## 2020-01-10 ENCOUNTER — Ambulatory Visit (INDEPENDENT_AMBULATORY_CARE_PROVIDER_SITE_OTHER): Payer: No Payment, Other | Admitting: Clinical

## 2020-01-10 DIAGNOSIS — F331 Major depressive disorder, recurrent, moderate: Secondary | ICD-10-CM

## 2020-01-11 NOTE — Progress Notes (Addendum)
   THERAPIST PROGRESS NOTE Virtual Visit via Video Note  I connected with Casey Jordan on 01/10/2020 at  3:00 PM EDT by a video enabled telemedicine application and verified that I am speaking with the correct person using two identifiers.  Location: Patient: Home Provider: Office   I discussed the limitations of evaluation and management by telemedicine and the availability of in person appointments. The patient expressed understanding and agreed to proceed.    Follow Up Instructions:    I discussed the assessment and treatment plan with the patient. The patient was provided an opportunity to ask questions and all were answered. The patient agreed with the plan and demonstrated an understanding of the instructions.   The patient was advised to call back or seek an in-person evaluation if the symptoms worsen or if the condition fails to improve as anticipated.   Session Time: 60 minutes  Participation Level: Active  Behavioral Response: CasualAlertAnxious  Type of Therapy: Individual Therapy  Treatment Goals addressed: Diagnosis: Depression  Interventions: CBT  Summary:  Casey Jordan is a 54 y.o. female who presents for the scheduled appointment on time via video.  Client presented neatly dressed, oriented times five, and friendly. Client denied hallucinations and delusions.  Client reported she has been feeling anxious over the past few weeks for her doctors appointment this week that will aide in determining if she is awarded disability or not. Client stated, "I don't feel hopeful I feel scared". Client reported since her initial assessment she has struggled with lack of motivation due to the chronic pain that prevents her from standing or sitting for long and doing things around the house or with her family.  Client reported she's been sleeping more and self care has become difficult. Client reported her children have been supportive and understanding. Client reported  she talks to her friend who is also going through some difficulties also but the conversations can be depressing. Client discussed with the therapist past situations that seemed "impossible" and how they ended better than she would have predicted.  Client was receptive to trying the activities the therapist suggest as homework of dong crossword puzzles, reading, watch tv, sit outside, opposed to sleeping during the day.   Suicidal/Homicidal: Nowithout intent/plan  Therapist Response:  Therapist allowed time for the clients thoughts and feelings. Therapist continued to build a therapeutic relationship with the client by using active listening, empathy, and offering feedback. Therapist used CBT to collaborate with the client to challenge her negative thoughts and how the outcome of past situations had a better outcome than what she predicted.  Therapist assigned the client homework to modify her activities during the day, that keep her mind active, so she can sleep at night.    Plan: Return again in 2 weeks for individual therapy. Client was scheduled for next appointment.   Diagnosis: Major depressive disorder, recurrent episode, moderate    Casey Jordan Y Casey Rappaport, LCSW 01/11/2020

## 2020-01-30 ENCOUNTER — Other Ambulatory Visit (HOSPITAL_COMMUNITY): Payer: Self-pay | Admitting: Psychiatric/Mental Health

## 2020-01-30 ENCOUNTER — Telehealth (HOSPITAL_COMMUNITY): Payer: Self-pay | Admitting: *Deleted

## 2020-01-30 ENCOUNTER — Other Ambulatory Visit: Payer: Self-pay | Admitting: Family Medicine

## 2020-01-30 DIAGNOSIS — K219 Gastro-esophageal reflux disease without esophagitis: Secondary | ICD-10-CM

## 2020-01-30 DIAGNOSIS — F411 Generalized anxiety disorder: Secondary | ICD-10-CM

## 2020-01-30 DIAGNOSIS — F4310 Post-traumatic stress disorder, unspecified: Secondary | ICD-10-CM

## 2020-01-30 DIAGNOSIS — F331 Major depressive disorder, recurrent, moderate: Secondary | ICD-10-CM

## 2020-01-30 MED ORDER — LORAZEPAM 0.5 MG PO TABS: 0.5000 mg | ORAL_TABLET | Freq: Three times a day (TID) | ORAL | 2 refills | Status: DC

## 2020-01-30 MED ORDER — QUETIAPINE FUMARATE 100 MG PO TABS: 200.0000 mg | ORAL_TABLET | Freq: Every day | ORAL | 2 refills | Status: DC

## 2020-01-30 MED ORDER — GABAPENTIN 300 MG PO CAPS: 300.0000 mg | ORAL_CAPSULE | Freq: Three times a day (TID) | ORAL | 2 refills | Status: DC

## 2020-01-30 NOTE — Telephone Encounter (Signed)
Was seen recently by Ms Doren Custard NP and states she changed some of her med doses and didn't discuss it with her and she is unhappy about the changes.. Requesting to speak with Ms Doren Custard re this concern.

## 2020-01-30 NOTE — Telephone Encounter (Signed)
Called patient back, concern was the Gabapentin being called to pharmacy at 300mg  QD though hx had been acknowledged at 300 mg TID which is how she says she has been taking it and wants to continue taking 900 mg daily. Reached out to Ms Doren Custard to clarify the concern and she made the needed change in the Gabapentin. Patient called back after checking all of her RXs with [facility] and states her Ativan RX was not called in, she has been taking Ativan 0.5 mg BID and her Seroquel per [place] is 200 mg at HS and we ordered it as 100mg  at HS. WIll make Ms Doren Custard NP aware.

## 2020-02-20 ENCOUNTER — Ambulatory Visit (INDEPENDENT_AMBULATORY_CARE_PROVIDER_SITE_OTHER): Payer: No Payment, Other | Admitting: Clinical

## 2020-02-20 ENCOUNTER — Other Ambulatory Visit: Payer: Self-pay

## 2020-02-20 DIAGNOSIS — F331 Major depressive disorder, recurrent, moderate: Secondary | ICD-10-CM | POA: Diagnosis not present

## 2020-02-20 NOTE — Progress Notes (Signed)
THERAPIST PROGRESS NOTE Virtual Visit via Video Note  I connected with Casey Jordan on 02/20/20 at 11:00 AM EDT by a video enabled telemedicine application and verified that I am speaking with the correct person using two identifiers.  Location: Patient: Home Provider: Office   I discussed the limitations of evaluation and management by telemedicine and the availability of in person appointments. The patient expressed understanding and agreed to proceed.   Follow Up Instructions:    I discussed the assessment and treatment plan with the patient. The patient was provided an opportunity to ask questions and all were answered. The patient agreed with the plan and demonstrated an understanding of the instructions.   The patient was advised to call back or seek an in-person evaluation if the symptoms worsen or if the condition fails to improve as anticipated.    Session Time: 47 minutes  Participation Level: Active  Behavioral Response: CasualAlertDepressed  Type of Therapy: Individual Therapy  Treatment Goals addressed: Diagnosis: Depression  Interventions: CBT  Summary:  Casey Jordan is a 54 y.o. female who presents via video for the scheduled appointment. Client presented oriented times five, appropriately dressed, and friendly. Client denied delusions and hallucinations. Client reported on she has been breaking down and crying a lot. Client reported since the last session she met with several doctors for mental health and physical pain referred by disability that all agree she should be put on disability. Client reported her chronic pain makes it hard to sit or stand for long periods of time and make it difficult for her to rest comfortably. Client reported she is waiting to get an MRI for her brain to ensure a previous diagnosed brain lesion has not worsened. Client reported she has been feeling guilt also because of asking her parents to help her pay bills as well as  her younger son. Client reported currently her oldest son is back living at home with her while he works through job transitioning and sobriety. Client reported her son resembles depressive symptoms as her,doing well for a period of time and then a "crash". Client reported she has enjoyed his company and he has been doing well while staying with her.  Client discussed with the therapist it's easy to notice patterns of behavior and symptoms of her sons but for her it's hard to determine early warning signs for herself in particular before having an anxiety attack.   Client engaged in discussion with the therapist about noticing early warning signs or body reactions when she becomes tense before having a panic attacks so she can learn when to intervene early on. Client reported she has noticed that she does clench her teeth and has cracked a tooth because of it. Client reported she stopped taking the Buspar because she thought it was making her headaches worse but she will consult with the psychiatrist at her next appointment to discuss before she starts back on it. Client discussed with the therapist utilizing her social/ family support and practicing grounding techniques.     Suicidal/Homicidal: Nowithout intent/plan  Therapist Response: Therapist began the session by checking in and asking how the client was feeling on today. Therapist probed about positive and/ or negative situations that have affected the client since the last session. Therapist allowed time to focus on the clients thoughts and feelings using active listening and offering empathy. Therapist discussed how to complete progressive muscle relaxation and deep breathing to cope with anxiety. Therapist prompted the client to identify  early warning signs for herself. Therapist assigned the client homework to practice progressive muscle relaxation techniques.     Plan: Return again in 3 weeks for individual therapy. Schedule next  medication management appointment.  Diagnosis: Major depressive disorder, recurrent episode, moderate   Birdena Jubilee Jacqeline Broers, LCSW 02/20/2020

## 2020-03-01 ENCOUNTER — Telehealth (HOSPITAL_COMMUNITY): Payer: Self-pay

## 2020-03-05 ENCOUNTER — Ambulatory Visit (INDEPENDENT_AMBULATORY_CARE_PROVIDER_SITE_OTHER): Payer: No Payment, Other | Admitting: Clinical

## 2020-03-05 ENCOUNTER — Other Ambulatory Visit: Payer: Self-pay

## 2020-03-05 DIAGNOSIS — F331 Major depressive disorder, recurrent, moderate: Secondary | ICD-10-CM

## 2020-03-05 NOTE — Progress Notes (Signed)
   THERAPIST PROGRESS NOTE Virtual Visit via Video Note  I connected with Casey Jordan on 03/05/20 at 10:00 AM EDT by a video enabled telemedicine application and verified that I am speaking with the correct person using two identifiers.  Location: Patient: Home Provider: Office   I discussed the limitations of evaluation and management by telemedicine and the availability of in person appointments. The patient expressed understanding and agreed to proceed.   Follow Up Instructions:    I discussed the assessment and treatment plan with the patient. The patient was provided an opportunity to ask questions and all were answered. The patient agreed with the plan and demonstrated an understanding of the instructions.   The patient was advised to call back or seek an in-person evaluation if the symptoms worsen or if the condition fails to improve as anticipated.   Session Time: 25 minutes  Participation Level: Active  Behavioral Response: CasualAlertDepressed  Type of Therapy: Individual Therapy  Treatment Goals addressed: Diagnosis: Depression  Interventions: CBT  Summary:  Casey Jordan is a 54 y.o. female who presents oriented times five, appropriately dressed, and friendly. Client denied hallucinations and delusions. Client reported on today feeling tired and in pain. Client reported she has a follow up MRI appointment on today also to check how her discs have impacted the fluid from her spine to her head. Client discussed the previous injections in her back provided a few days of relief but proved to not be effective for long term relief. Client reported her whole back is hurting and the pain has worsened. Client reported she has a hard time sitting and standing for prolonged periods of time. Client reported her disability process has been held up because of COVID. Client reported she has noticed her anxiety increasing but believes that it has come from her physical  discomforts. Client reported she is doing physical therapy a few days a week also.  Client reported her depression worsens when thinking about being unable to do things that she wants to do and "my way of life has changed". Client reported she does not like the feeling of being dependent on others in her family. Client stated, "the pain is wearing on me".     Suicidal/Homicidal: Nowithout intent/plan  Therapist Response:  Therapist began the session by asking the client has been feeling since the last session. Therapist allowed time to focus on the clients thoughts and feelings. Therapist engaged the client in a discussion about continuing good self care practices. Therapist collaborated with the client to discuss her thoughts and feelings pertaining to one negative perspective. Therapist collaborated with the client to brainstorm alternate ways to find enjoyment in daily activities. Therapist scheduled the client for next appointments.      Plan: Return again in 2 weeks for individual therapy.  Diagnosis: Major depressive disorder, recurrent episode, moderate    Burdette Gergely Y Reona Zendejas, LCSW 03/05/2020

## 2020-03-06 ENCOUNTER — Encounter: Payer: Self-pay | Admitting: Physician Assistant

## 2020-03-06 ENCOUNTER — Other Ambulatory Visit: Payer: Self-pay

## 2020-03-06 ENCOUNTER — Ambulatory Visit: Payer: No Typology Code available for payment source | Attending: Physician Assistant | Admitting: Physician Assistant

## 2020-03-06 ENCOUNTER — Ambulatory Visit (HOSPITAL_COMMUNITY)
Admission: RE | Admit: 2020-03-06 | Discharge: 2020-03-06 | Disposition: A | Discharge status: Home / Self Care | Payer: Medicaid Other | Source: Ambulatory Visit | Attending: Physician Assistant | Admitting: Physician Assistant

## 2020-03-06 VITALS — BP 123/87 | HR 110 | Temp 98.2°F | Resp 16 | Ht 65.0 in | Wt 236.2 lb

## 2020-03-06 DIAGNOSIS — F331 Major depressive disorder, recurrent, moderate: Secondary | ICD-10-CM | POA: Diagnosis not present

## 2020-03-06 DIAGNOSIS — N200 Calculus of kidney: Secondary | ICD-10-CM

## 2020-03-06 DIAGNOSIS — J45909 Unspecified asthma, uncomplicated: Secondary | ICD-10-CM

## 2020-03-06 DIAGNOSIS — Z13220 Encounter for screening for lipoid disorders: Secondary | ICD-10-CM

## 2020-03-06 DIAGNOSIS — F411 Generalized anxiety disorder: Secondary | ICD-10-CM

## 2020-03-06 DIAGNOSIS — N898 Other specified noninflammatory disorders of vagina: Secondary | ICD-10-CM

## 2020-03-06 DIAGNOSIS — Z1211 Encounter for screening for malignant neoplasm of colon: Secondary | ICD-10-CM

## 2020-03-06 DIAGNOSIS — Z79899 Other long term (current) drug therapy: Secondary | ICD-10-CM

## 2020-03-06 DIAGNOSIS — Z124 Encounter for screening for malignant neoplasm of cervix: Secondary | ICD-10-CM

## 2020-03-06 DIAGNOSIS — Z131 Encounter for screening for diabetes mellitus: Secondary | ICD-10-CM | POA: Diagnosis not present

## 2020-03-06 DIAGNOSIS — Z1239 Encounter for other screening for malignant neoplasm of breast: Secondary | ICD-10-CM

## 2020-03-06 DIAGNOSIS — R1013 Epigastric pain: Secondary | ICD-10-CM

## 2020-03-06 DIAGNOSIS — K219 Gastro-esophageal reflux disease without esophagitis: Secondary | ICD-10-CM

## 2020-03-06 DIAGNOSIS — M549 Dorsalgia, unspecified: Secondary | ICD-10-CM

## 2020-03-06 IMAGING — CR DG ABDOMEN 1V
2 series · 2 of 2 positions shown · non-contrast
Comparison: [DATE]

CLINICAL DATA: Right flank pain and hematuria

EXAM:
ABDOMEN - 1 VIEW

[abdomen kub (1 of 2)]
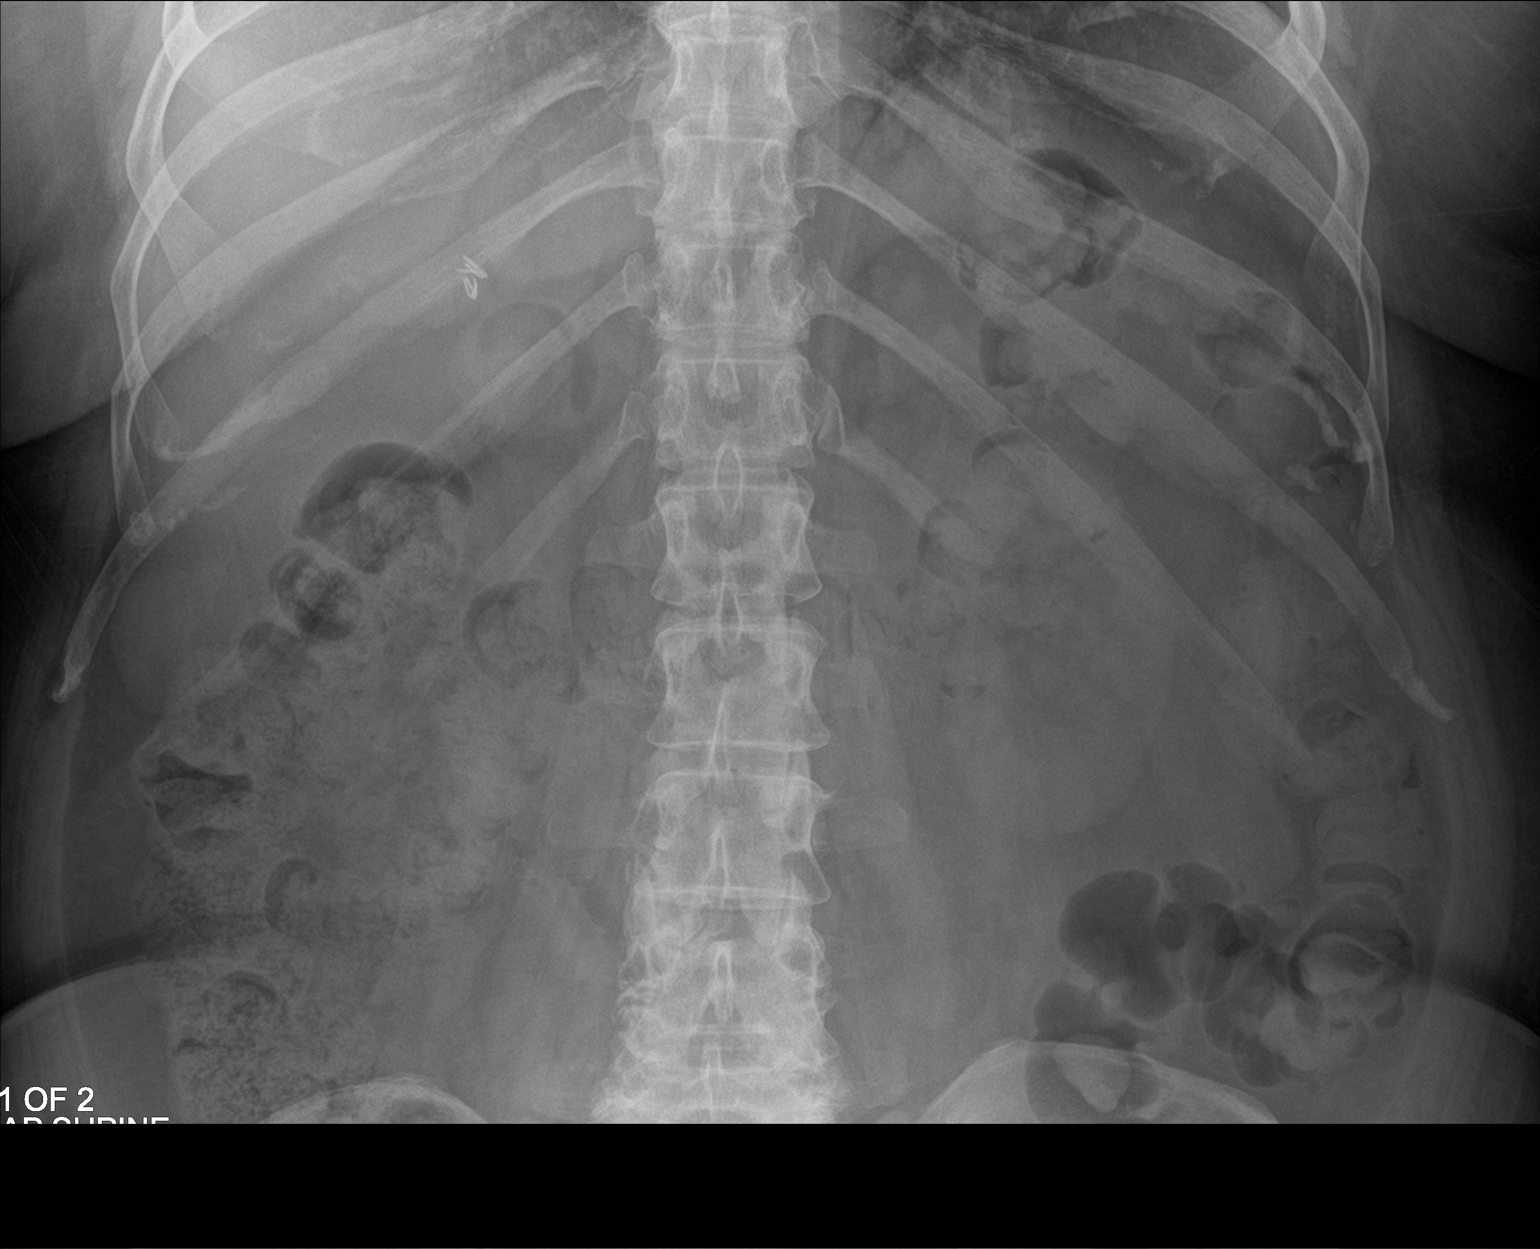

[abdomen kub (2 of 2)]
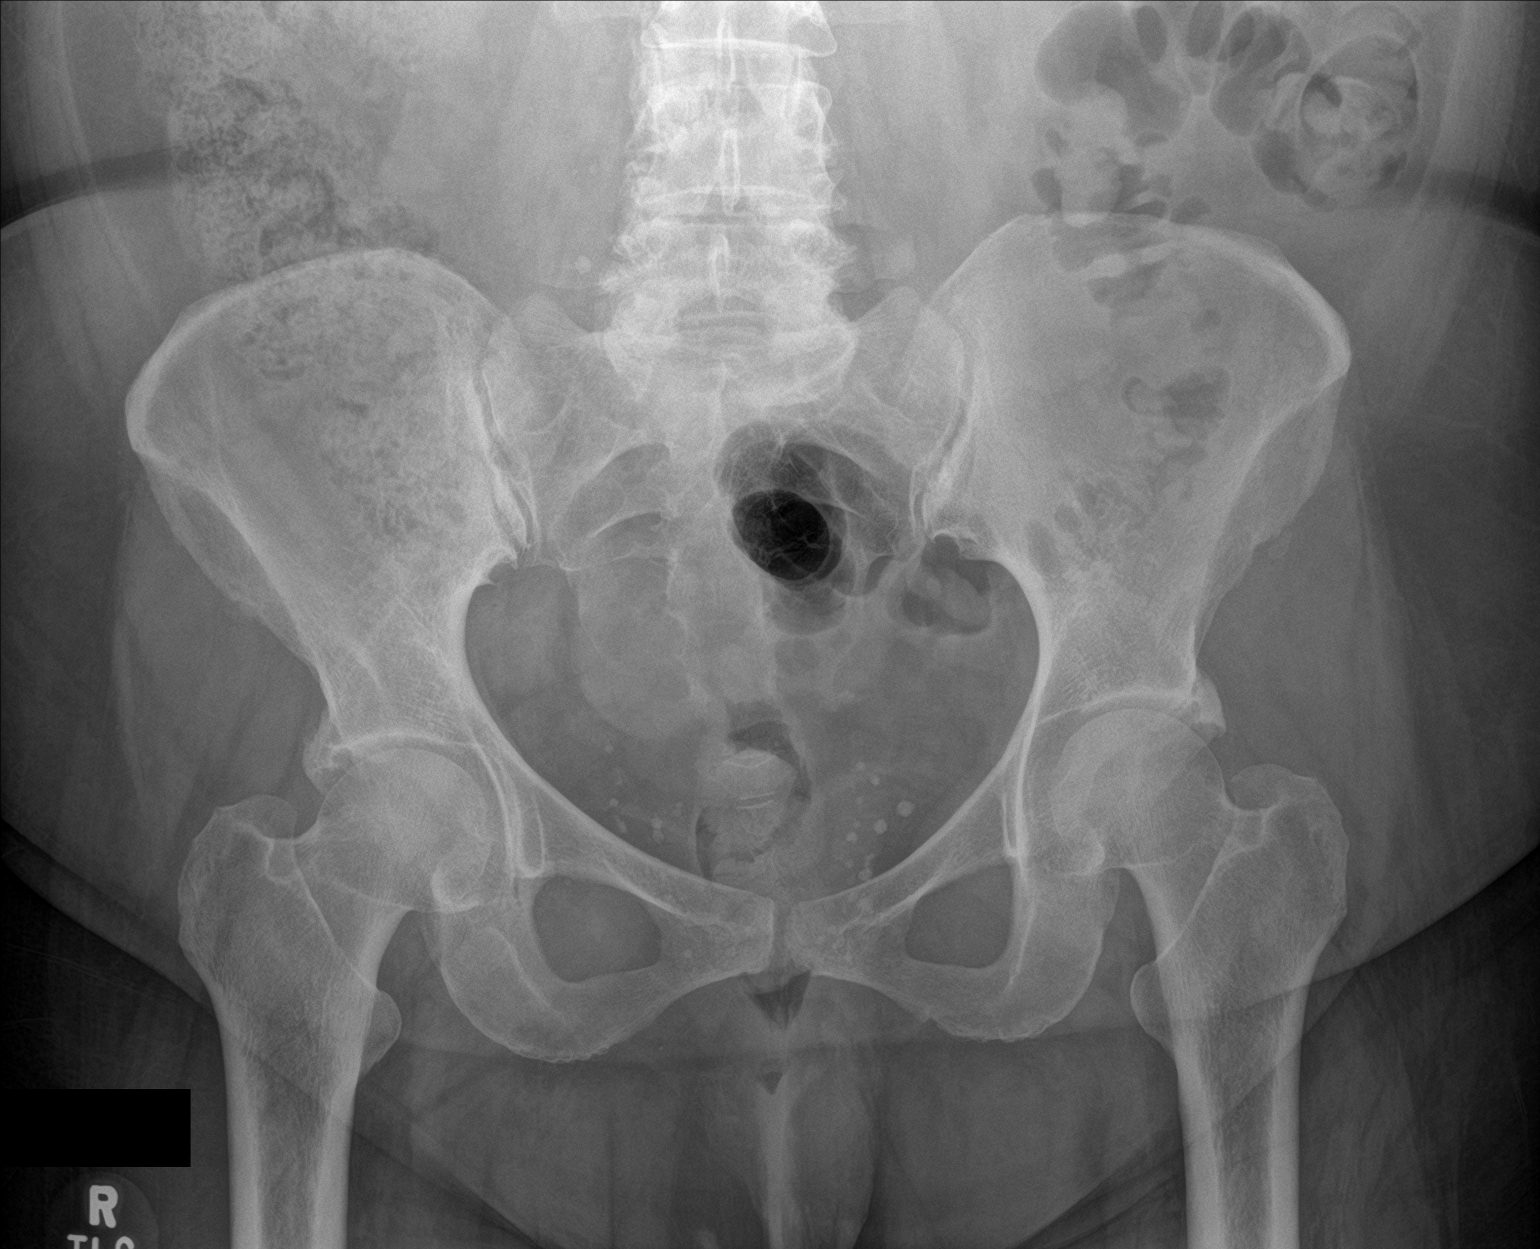

[2 of 2 positions shown; findings below may reference images not displayed]

FINDINGS: There is diffuse stool throughout the colon. There is a 4 mm
calcification on the right at the level of the L5 transverse
process. There are multiple apparent phleboliths in the pelvis.
There is no bowel dilatation or air-fluid level to suggest bowel
obstruction. No free air. Surgical clips noted in the gallbladder
fossa region.
IMPRESSION: 4 mm calcification of uncertain etiology overlying the transverse
process on the right at L5. A ureteral calculus cannot be excluded.
Probable phleboliths noted in the pelvis.

Diffuse stool throughout colon. No bowel obstruction or free air
evident.

## 2020-03-06 MED ORDER — METHOCARBAMOL 500 MG PO TABS: 500.0000 mg | ORAL_TABLET | Freq: Three times a day (TID) | ORAL | 0 refills | Status: DC | PRN

## 2020-03-06 MED ORDER — ALBUTEROL SULFATE HFA 108 (90 BASE) MCG/ACT IN AERS: 2.0000 | INHALATION_SPRAY | Freq: Four times a day (QID) | RESPIRATORY_TRACT | 2 refills | Status: DC | PRN

## 2020-03-06 MED ORDER — MECLIZINE HCL 25 MG PO TABS: 25.0000 mg | ORAL_TABLET | Freq: Three times a day (TID) | ORAL | 2 refills | Status: DC | PRN

## 2020-03-06 MED ORDER — IPRATROPIUM-ALBUTEROL 0.5-2.5 (3) MG/3ML IN SOLN: RESPIRATORY_TRACT | 2 refills | Status: DC

## 2020-03-06 MED ORDER — FAMOTIDINE 20 MG PO TABS: 20.0000 mg | ORAL_TABLET | Freq: Two times a day (BID) | ORAL | 2 refills | Status: DC

## 2020-03-06 MED ORDER — OMEPRAZOLE 40 MG PO CPDR: 40.0000 mg | DELAYED_RELEASE_CAPSULE | Freq: Two times a day (BID) | ORAL | 2 refills | Status: DC

## 2020-03-06 MED ORDER — GABAPENTIN 300 MG PO CAPS: 300.0000 mg | ORAL_CAPSULE | Freq: Three times a day (TID) | ORAL | 2 refills | Status: DC

## 2020-03-06 MED ORDER — ALBUTEROL SULFATE HFA 108 (90 BASE) MCG/ACT IN AERS: 2.0000 | INHALATION_SPRAY | Freq: Four times a day (QID) | RESPIRATORY_TRACT | 2 refills | Status: AC | PRN

## 2020-03-06 MED ORDER — PRAZOSIN HCL 1 MG PO CAPS: 1.0000 mg | ORAL_CAPSULE | Freq: Every day | ORAL | 3 refills | Status: DC

## 2020-03-06 NOTE — Progress Notes (Signed)
Casey Jordan, is a 54 y.o. female  JGG:[id number]  LYY:[id number]  DOB - 02-Aug-1965  Subjective:  Chief Complaint and HPI: Casey Jordan is a 54 y.o. female here today for CPE.  Also has h/o kidney stones and has been having occasional flank pain on the R.  Comes and goes.  C/o mid epigastric pain that comes and goes,  She has had multiple scans.  No urinary s/sx.    Clonoscopy > 5 years and due MMG > 2 years and does not do SBE.    ROS:   Constitutional:  No f/c, No night sweats, No unexplained weight loss. EENT:  No vision changes, No blurry vision, No hearing changes. No mouth, throat, or ear problems.  Respiratory: No cough, No SOB Cardiac: No CP, no palpitations GI:  No abd pain, No N/V/D. GU: No Urinary s/sx Musculoskeletal: chronic back pain.   Neuro: No headache, no dizziness, no motor weakness.  Skin: No rash Endocrine:  No polydipsia. No polyuria.  Psych: Denies SI/HI  No problems updated.  ALLERGIES: Allergies  Allergen Reactions   Avocado Other (See Comments)    Stomach pain, cause tongue and throat swelling   Codeine Nausea And Vomiting   Naproxen Swelling   Sulfa Antibiotics Other (See Comments)    Gets boils   Fluconazole     Swelling in mucosa    Penicillins Other (See Comments)     Has patient had a PCN reaction causing immediate rash, facial/tongue/throat swelling, SOB or lightheadedness with hypotension: No Has patient had a PCN reaction causing severe rash involving mucus membranes or skin necrosis: No Has patient had a PCN reaction that required hospitalization No Has patient had a PCN reaction occurring within the last 10 years: No If all of the above answers are "NO", then may proceed with Cephalosporin use.    Zofran [Ondansetron] Itching    PAST MEDICAL HISTORY: Past Medical History:  Diagnosis Date   Anemia    Asthma    Depression    DVT (deep venous thrombosis) (HCC)    lower leg while traveling went to lung    GERD (gastroesophageal reflux disease)    Headache    Migraines   History of kidney stones    Vaginal Pap smear, abnormal    Vitamin D insufficiency     MEDICATIONS AT HOME: Prior to Admission medications   Medication Sig Start Date End Date Taking? Authorizing Provider  albuterol (VENTOLIN HFA) 108 (90 Base) MCG/ACT inhaler Inhale 2 puffs into the lungs every 6 (six) hours as needed for wheezing or shortness of breath. 03/06/20   Argentina Donovan, PA-C  buPROPion (WELLBUTRIN XL) 150 MG 24 hr tablet Take 150 mg by mouth daily.    [provider]  busPIRone (BUSPAR) 10 MG tablet Take 1 tablet (10 mg total) by mouth 3 (three) times daily. 12/29/19 03/28/20  Deloria Lair, NP  cyclobenzaprine (FLEXERIL) 10 MG tablet Take 1 tablet (10 mg total) by mouth 2 (two) times daily as needed for muscle spasms. 10/15/19   Camillia Herter, NP  diphenhydrAMINE (BENADRYL) 25 MG tablet Take 50 mg by mouth daily.    [provider]  famotidine (PEPCID) 20 MG tablet Take 1 tablet (20 mg total) by mouth 2 (two) times daily. 03/06/20   Argentina Donovan, PA-C  gabapentin (NEURONTIN) 300 MG capsule Take 1 capsule (300 mg total) by mouth 3 (three) times daily. 03/06/20 06/04/20  Argentina Donovan, PA-C  ibuprofen (ADVIL) 600 MG  tablet Take 1 tablet (600 mg total) by mouth every 8 (eight) hours as needed. For pain 08/09/19   Fulp, Cammie, MD  ibuprofen (ADVIL) 600 MG tablet TAKE 1 TABLET BY MOUTH THREE TIMES A DAY 10/26/19   Charlott Rakes, MD  ipratropium-albuterol (DUONEB) 0.5-2.5 (3) MG/3ML SOLN 1 nebule every 4 to 6 hrs prn wheezing 03/06/20   Argentina Donovan, PA-C  LORazepam (ATIVAN) 0.5 MG tablet Take 1 tablet (0.5 mg total) by mouth in the morning, at noon, and at bedtime. 01/30/20 04/29/20  Deloria Lair, NP  meclizine (ANTIVERT) 25 MG tablet Take 25 mg by mouth 3 (three) times daily as needed for dizziness.     [provider]  methocarbamol (ROBAXIN) 500 MG tablet Take 1 tablet  (500 mg total) by mouth every 8 (eight) hours as needed for muscle spasms. 03/06/20   Argentina Donovan, PA-C  Multiple Vitamin (MULTIVITAMIN WITH MINERALS) TABS tablet Take 1 tablet by mouth daily.    [provider]  omeprazole (PRILOSEC) 40 MG capsule Take 1 capsule (40 mg total) by mouth 2 (two) times daily. 03/06/20   Argentina Donovan, PA-C  PARoxetine (PAXIL) 20 MG tablet Take 1 tablet (20 mg total) by mouth daily. 12/29/19 03/28/20  Deloria Lair, NP  prazosin (MINIPRESS) 1 MG capsule Take 1 capsule (1 mg total) by mouth at bedtime. 03/06/20   Argentina Donovan, PA-C  promethazine (PHENERGAN) 12.5 MG tablet Take 1 tablet (12.5 mg total) by mouth every 8 (eight) hours as needed for nausea or vomiting. Patient not taking: Reported on 10/12/2019 08/09/19   Fulp, Ander Gaster, MD  QUEtiapine (SEROQUEL) 100 MG tablet Take 2 tablets (200 mg total) by mouth at bedtime. 01/30/20 04/29/20  Deloria Lair, NP  rOPINIRole (REQUIP) 0.25 MG tablet Take 0.25 mg by mouth at bedtime.    [provider]     Objective:  EXAM:   Vitals:   03/06/20 0928  BP: 123/87  Pulse: (!) 110  Resp: 16  Temp: 98.2 F (36.8 C)  SpO2: 95%  Weight: 236 lb 3.2 oz (107.1 kg)  Height: 5\' 5"  (1.651 m)    General appearance : A&OX3. NAD. Non-toxic-appearing HEENT: Atraumatic and Normocephalic.  PERRLA. EOM intact.  TM clear B. Mouth-MMM, post pharynx WNL w/o erythema, No PND. Neck: supple, no JVD. No cervical lymphadenopathy. No thyromegaly Chest/Lungs:  Breathing-non-labored, Good air entry bilaterally, breath sounds normal without rales, rhonchi, or wheezing  Breasts:  B large pendulus breasts without any discrete nodule or mass.  B axilla without LN.   CVS: S1 S2 regular, no murmurs, gallops, rubs  Abdomen: Bowel sounds present, Non tender and not distended with no gaurding, rigidity or rebound. GU:  Slight rectal prolapse, speculum inserted, cervix WNL, pap taken.  bimaual unremarkable Extremities:  Bilateral Lower Ext shows no edema, both legs are warm to touch with = pulse throughout Neurology:  CN II-XII grossly intact, Non focal.   Psych:  TP linear. J/I WNL. Normal speech. Appropriate eye contact and affect.  Skin:  No Rash  Data Review Lab Results  Component Value Date   HGBA1C 6.0 08/09/2019     Assessment & Plan   1. Screening for diabetes mellitus I have had a lengthy discussion and provided education about insulin resistance and the intake of too much sugar/refined carbohydrates.  I have advised the patient to work at a goal of eliminating sugary drinks, candy, desserts, sweets, refined sugars, processed foods, and white carbohydrates.  The  patient expresses understanding.  - Hemoglobin A1c  2. Screening cholesterol level  3. Generalized anxiety disorder Followed by [facility] - gabapentin (NEURONTIN) 300 MG capsule; Take 1 capsule (300 mg total) by mouth 3 (three) times daily.  Dispense: 90 capsule; Refill: 2  4. Major depressive disorder, recurrent episode, moderate (HCC) Continue f/up [facility]  5. Mild asthma, unspecified whether complicated, unspecified whether persistent - ipratropium-albuterol (DUONEB) 0.5-2.5 (3) MG/3ML SOLN; 1 nebule every 4 to 6 hrs prn wheezing  Dispense: 360 mL; Refill: 2 - albuterol (VENTOLIN HFA) 108 (90 Base) MCG/ACT inhaler; Inhale 2 puffs into the lungs every 6 (six) hours as needed for wheezing or shortness of breath.  Dispense: 18 g; Refill: 2  6. High risk medication use - Comprehensive metabolic panel - CBC with Differential/Platelet - TSH  7. Cervical cancer screening - Cervicovaginal ancillary only  8. Vaginal discharge - Cervicovaginal ancillary only  9. Dyspepsia - H. pylori breath test  10. Kidney stones - DG Abd 1 View; Future  11. Encounter for screening for malignant neoplasm of breast, unspecified screening modality SBE taught and encouraged- MM Digital Screening; Future  12. Screening for colon cancer -  Ambulatory referral to Gastroenterology  13. Gastroesophageal reflux disease, unspecified whether esophagitis present - omeprazole (PRILOSEC) 40 MG capsule; Take 1 capsule (40 mg total) by mouth 2 (two) times daily.  Dispense: 60 capsule; Refill: 2 - famotidine (PEPCID) 20 MG tablet; Take 1 tablet (20 mg total) by mouth 2 (two) times daily.  Dispense: 60 tablet; Refill: 2  14. Acute mid back pain - methocarbamol (ROBAXIN) 500 MG tablet; Take 1 tablet (500 mg total) by mouth every 8 (eight) hours as needed for muscle spasms.  Dispense: 60 tablet; Refill: 0   Patient have been counseled extensively about nutrition and exercise  Return in about 6 months (around 09/06/2020) for PCP; chronic conditions.  The patient was given clear instructions to go to ER or return to medical center if symptoms don't improve, worsen or new problems develop. The patient verbalized understanding. The patient was told to call to get lab results if they haven't heard anything in the next week.     Freeman Caldron, PA-C [facility] [place], [place]   03/06/2020, 9:51 AMPatient ID: [name], female   DOB: January 17, 1966, 54 y.o.   MRN: [id number]

## 2020-03-06 NOTE — Patient Instructions (Signed)
Health Maintenance, Female Adopting a healthy lifestyle and getting preventive care are important in promoting health and wellness. Ask your health care provider about:  The right schedule for you to have regular tests and exams.  Things you can do on your own to prevent diseases and keep yourself healthy. What should I know about diet, weight, and exercise? Eat a healthy diet   Eat a diet that includes plenty of vegetables, fruits, low-fat dairy products, and lean protein.  Do not eat a lot of foods that are high in solid fats, added sugars, or sodium. Maintain a healthy weight Body mass index (BMI) is used to identify weight problems. It estimates body fat based on height and weight. Your health care provider can help determine your BMI and help you achieve or maintain a healthy weight. Get regular exercise Get regular exercise. This is one of the most important things you can do for your health. Most adults should:  Exercise for at least 150 minutes each week. The exercise should increase your heart rate and make you sweat (moderate-intensity exercise).  Do strengthening exercises at least twice a week. This is in addition to the moderate-intensity exercise.  Spend less time sitting. Even light physical activity can be beneficial. Watch cholesterol and blood lipids Have your blood tested for lipids and cholesterol at 54 years of age, then have this test every 5 years. Have your cholesterol levels checked more often if:  Your lipid or cholesterol levels are high.  You are older than 54 years of age.  You are at high risk for heart disease. What should I know about cancer screening? Depending on your health history and family history, you may need to have cancer screening at various ages. This may include screening for:  Breast cancer.  Cervical cancer.  Colorectal cancer.  Skin cancer.  Lung cancer. What should I know about heart disease, diabetes, and high blood  pressure? Blood pressure and heart disease  High blood pressure causes heart disease and increases the risk of stroke. This is more likely to develop in people who have high blood pressure readings, are of African descent, or are overweight.  Have your blood pressure checked: ? Every 3-5 years if you are 18-39 years of age. ? Every year if you are 40 years old or older. Diabetes Have regular diabetes screenings. This checks your fasting blood sugar level. Have the screening done:  Once every three years after age 40 if you are at a normal weight and have a low risk for diabetes.  More often and at a younger age if you are overweight or have a high risk for diabetes. What should I know about preventing infection? Hepatitis B If you have a higher risk for hepatitis B, you should be screened for this virus. Talk with your health care provider to find out if you are at risk for hepatitis B infection. Hepatitis C Testing is recommended for:  Everyone born from 1945 through 1965.  Anyone with known risk factors for hepatitis C. Sexually transmitted infections (STIs)  Get screened for STIs, including gonorrhea and chlamydia, if: ? You are sexually active and are younger than 54 years of age. ? You are older than 54 years of age and your health care provider tells you that you are at risk for this type of infection. ? Your sexual activity has changed since you were last screened, and you are at increased risk for chlamydia or gonorrhea. Ask your health care provider if   you are at risk.  Ask your health care provider about whether you are at high risk for HIV. Your health care provider may recommend a prescription medicine to help prevent HIV infection. If you choose to take medicine to prevent HIV, you should first get tested for HIV. You should then be tested every 3 months for as long as you are taking the medicine. Pregnancy  If you are about to stop having your period (premenopausal) and  you may become pregnant, seek counseling before you get pregnant.  Take 400 to 800 micrograms (mcg) of folic acid every day if you become pregnant.  Ask for birth control (contraception) if you want to prevent pregnancy. Osteoporosis and menopause Osteoporosis is a disease in which the bones lose minerals and strength with aging. This can result in bone fractures. If you are 65 years old or older, or if you are at risk for osteoporosis and fractures, ask your health care provider if you should:  Be screened for bone loss.  Take a calcium or vitamin D supplement to lower your risk of fractures.  Be given hormone replacement therapy (HRT) to treat symptoms of menopause. Follow these instructions at home: Lifestyle  Do not use any products that contain nicotine or tobacco, such as cigarettes, e-cigarettes, and chewing tobacco. If you need help quitting, ask your health care provider.  Do not use street drugs.  Do not share needles.  Ask your health care provider for help if you need support or information about quitting drugs. Alcohol use  Do not drink alcohol if: ? Your health care provider tells you not to drink. ? You are pregnant, may be pregnant, or are planning to become pregnant.  If you drink alcohol: ? Limit how much you use to 0-1 drink a day. ? Limit intake if you are breastfeeding.  Be aware of how much alcohol is in your drink. In the [place] one drink equals one 12 oz bottle of beer (355 mL), one 5 oz glass of wine (148 mL), or one 1 oz glass of hard liquor (44 mL). General instructions  Schedule regular health, dental, and eye exams.  Stay current with your vaccines.  Tell your health care provider if: ? You often feel depressed. ? You have ever been abused or do not feel safe at home. Summary  Adopting a healthy lifestyle and getting preventive care are important in promoting health and wellness.  Follow your health care provider's instructions about healthy  diet, exercising, and getting tested or screened for diseases.  Follow your health care provider's instructions on monitoring your cholesterol and blood pressure. This information is not intended to replace advice given to you by your health care provider. Make sure you discuss any questions you have with your health care provider. Document Revised: 06/29/2018 Document Reviewed: 06/29/2018 Elsevier Patient Education  2020 [employer]  

## 2020-03-07 LAB — COMPREHENSIVE METABOLIC PANEL
ALT: 42 IU/L — ABNORMAL HIGH (ref 0–32)
AST: 41 IU/L — ABNORMAL HIGH (ref 0–40)
Albumin/Globulin Ratio: 1.8 (ref 1.2–2.2)
Albumin: 4.4 g/dL (ref 3.8–4.9)
Alkaline Phosphatase: 109 IU/L (ref 48–121)
BUN/Creatinine Ratio: 19 (ref 9–23)
BUN: 16 mg/dL (ref 6–24)
Bilirubin Total: <0.2 mg/dL (ref 0.0–1.2)
CO2: 23 mmol/L (ref 20–29)
Calcium: 9.8 mg/dL (ref 8.7–10.2)
Chloride: 105 mmol/L (ref 96–106)
Creatinine, Ser: 0.84 mg/dL (ref 0.57–1.00)
GFR calc Af Amer: 91 mL/min/{1.73_m2} (ref >59)
GFR calc non Af Amer: 79 mL/min/{1.73_m2} (ref >59)
Globulin, Total: 2.5 g/dL (ref 1.5–4.5)
Glucose: 94 mg/dL (ref 65–99)
Potassium: 4.4 mmol/L (ref 3.5–5.2)
Sodium: 142 mmol/L (ref 134–144)
Total Protein: 6.9 g/dL (ref 6.0–8.5)

## 2020-03-07 LAB — CBC WITH DIFFERENTIAL/PLATELET
Basophils Absolute: 0 10*3/uL (ref 0.0–0.2)
Basos: 1 %
EOS (ABSOLUTE): 0.1 10*3/uL (ref 0.0–0.4)
Eos: 1 %
Hematocrit: 37.8 % (ref 34.0–46.6)
Hemoglobin: 12.9 g/dL (ref 11.1–15.9)
Immature Grans (Abs): 0 10*3/uL (ref 0.0–0.1)
Immature Granulocytes: 0 %
Lymphocytes Absolute: 2.1 10*3/uL (ref 0.7–3.1)
Lymphs: 31 %
MCH: 30.1 pg (ref 26.6–33.0)
MCHC: 34.1 g/dL (ref 31.5–35.7)
MCV: 88 fL (ref 79–97)
Monocytes Absolute: 0.4 10*3/uL (ref 0.1–0.9)
Monocytes: 6 %
Neutrophils Absolute: 4.2 10*3/uL (ref 1.4–7.0)
Neutrophils: 61 %
Platelets: 359 10*3/uL (ref 150–450)
RBC: 4.29 x10E6/uL (ref 3.77–5.28)
RDW: 14.4 % (ref 11.7–15.4)
WBC: 6.8 10*3/uL (ref 3.4–10.8)

## 2020-03-07 LAB — CYTOLOGY - PAP
Comment: NEGATIVE
Diagnosis: NEGATIVE
High risk HPV: NEGATIVE

## 2020-03-07 LAB — HEMOGLOBIN A1C
Est. average glucose Bld gHb Est-mCnc: 137 mg/dL
Hgb A1c MFr Bld: 6.4 % — ABNORMAL HIGH (ref 4.8–5.6)

## 2020-03-07 LAB — TSH: TSH: 2.09 u[IU]/mL (ref 0.450–4.500)

## 2020-03-08 ENCOUNTER — Telehealth: Payer: Self-pay | Admitting: *Deleted

## 2020-03-08 LAB — H. PYLORI BREATH TEST: H pylori Breath Test: NEGATIVE

## 2020-03-08 NOTE — Telephone Encounter (Signed)
Copied from [place] [id number]. Topic: General - Other >> Mar 06, 2020  5:06 PM Yvette Rack wrote: Reason for CRM: Pt called for x-ray results. Pt requests call back >> Mar 07, 2020  3:49 PM Keene Breath wrote: Patient is calling again to get x-ray results.  Please call patient as soon as they are reviewed to discuss.   Spoke to patient regarding results.

## 2020-03-11 ENCOUNTER — Encounter: Payer: Self-pay | Admitting: *Deleted

## 2020-03-16 ENCOUNTER — Telehealth (HOSPITAL_COMMUNITY): Payer: Self-pay | Admitting: Adult Health

## 2020-03-17 ENCOUNTER — Telehealth: Payer: Self-pay

## 2020-03-17 NOTE — Telephone Encounter (Signed)
Reason for CRM: Pt called for x-ray results. Pt requests call back

## 2020-03-19 ENCOUNTER — Ambulatory Visit (HOSPITAL_COMMUNITY): Payer: Self-pay | Admitting: Clinical

## 2020-03-26 ENCOUNTER — Telehealth: Payer: Self-pay

## 2020-03-26 NOTE — Telephone Encounter (Signed)
Entry in error

## 2020-04-02 ENCOUNTER — Ambulatory Visit (HOSPITAL_COMMUNITY): Payer: Self-pay | Admitting: Clinical

## 2020-04-05 ENCOUNTER — Ambulatory Visit: Payer: Medicaid Other | Attending: Family Medicine | Admitting: Family Medicine

## 2020-04-05 ENCOUNTER — Other Ambulatory Visit: Payer: Self-pay

## 2020-04-05 ENCOUNTER — Encounter: Payer: Self-pay | Admitting: Family Medicine

## 2020-04-05 VITALS — BP 118/80 | HR 98 | Ht 65.0 in | Wt 235.4 lb

## 2020-04-05 DIAGNOSIS — R7303 Prediabetes: Secondary | ICD-10-CM

## 2020-04-05 DIAGNOSIS — R350 Frequency of micturition: Secondary | ICD-10-CM

## 2020-04-05 DIAGNOSIS — Z09 Encounter for follow-up examination after completed treatment for conditions other than malignant neoplasm: Secondary | ICD-10-CM

## 2020-04-05 DIAGNOSIS — Z01818 Encounter for other preprocedural examination: Secondary | ICD-10-CM

## 2020-04-05 DIAGNOSIS — R1013 Epigastric pain: Secondary | ICD-10-CM

## 2020-04-05 DIAGNOSIS — R300 Dysuria: Secondary | ICD-10-CM

## 2020-04-05 DIAGNOSIS — Z1331 Encounter for screening for depression: Secondary | ICD-10-CM

## 2020-04-05 DIAGNOSIS — Z23 Encounter for immunization: Secondary | ICD-10-CM

## 2020-04-05 LAB — POCT URINALYSIS DIP (CLINITEK)
Bilirubin, UA: NEGATIVE
Blood, UA: NEGATIVE
Glucose, UA: NEGATIVE mg/dL
Ketones, POC UA: NEGATIVE mg/dL
Nitrite, UA: NEGATIVE
Spec Grav, UA: 1.025
Urobilinogen, UA: 0.2 U/dL
pH, UA: 6.5

## 2020-04-05 NOTE — Progress Notes (Signed)
Patient is having fusion on her neck and back.
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
## 2020-04-06 LAB — COMPREHENSIVE METABOLIC PANEL WITH GFR
ALT: 43 IU/L — ABNORMAL HIGH (ref 0–32)
AST: 42 IU/L — ABNORMAL HIGH (ref 0–40)
Albumin/Globulin Ratio: 1.7 (ref 1.2–2.2)
Albumin: 4.6 g/dL (ref 3.8–4.9)
Alkaline Phosphatase: 104 IU/L (ref 44–121)
BUN/Creatinine Ratio: 19 (ref 9–23)
BUN: 13 mg/dL (ref 6–24)
Bilirubin Total: 0.3 mg/dL (ref 0.0–1.2)
CO2: 22 mmol/L (ref 20–29)
Calcium: 10.4 mg/dL — ABNORMAL HIGH (ref 8.7–10.2)
Chloride: 103 mmol/L (ref 96–106)
Creatinine, Ser: 0.67 mg/dL (ref 0.57–1.00)
GFR calc Af Amer: 115 mL/min/1.73
GFR calc non Af Amer: 100 mL/min/1.73
Globulin, Total: 2.7 g/dL (ref 1.5–4.5)
Glucose: 80 mg/dL (ref 65–99)
Potassium: 4.4 mmol/L (ref 3.5–5.2)
Sodium: 140 mmol/L (ref 134–144)
Total Protein: 7.3 g/dL (ref 6.0–8.5)

## 2020-04-06 LAB — CBC WITH DIFFERENTIAL/PLATELET
Basophils Absolute: 0.1 x10E3/uL (ref 0.0–0.2)
Basos: 1 %
EOS (ABSOLUTE): 0.1 x10E3/uL (ref 0.0–0.4)
Eos: 1 %
Hematocrit: 38.5 % (ref 34.0–46.6)
Hemoglobin: 13.2 g/dL (ref 11.1–15.9)
Immature Grans (Abs): 0 x10E3/uL (ref 0.0–0.1)
Immature Granulocytes: 0 %
Lymphocytes Absolute: 2.1 x10E3/uL (ref 0.7–3.1)
Lymphs: 27 %
MCH: 30.1 pg (ref 26.6–33.0)
MCHC: 34.3 g/dL (ref 31.5–35.7)
MCV: 88 fL (ref 79–97)
Monocytes Absolute: 0.4 x10E3/uL (ref 0.1–0.9)
Monocytes: 5 %
Neutrophils Absolute: 5.3 x10E3/uL (ref 1.4–7.0)
Neutrophils: 66 %
Platelets: 405 x10E3/uL (ref 150–450)
RBC: 4.38 x10E6/uL (ref 3.77–5.28)
RDW: 13.8 % (ref 11.7–15.4)
WBC: 8 x10E3/uL (ref 3.4–10.8)

## 2020-04-06 LAB — PROTIME-INR
INR: 1 (ref 0.9–1.2)
Prothrombin Time: 10.2 s (ref 9.1–12.0)

## 2020-04-06 LAB — HEMOGLOBIN A1C
Est. average glucose Bld gHb Est-mCnc: 126 mg/dL
Hgb A1c MFr Bld: 6 % — ABNORMAL HIGH (ref 4.8–5.6)

## 2020-04-06 LAB — APTT: aPTT: 25 s (ref 24–33)

## 2020-04-09 ENCOUNTER — Other Ambulatory Visit: Payer: Self-pay | Admitting: Family Medicine

## 2020-04-09 ENCOUNTER — Telehealth: Payer: Self-pay | Admitting: Family Medicine

## 2020-04-09 LAB — URINE CULTURE

## 2020-04-09 NOTE — Telephone Encounter (Signed)
Please find out details as to why she needs the ENT referral- what problems are being evaluated by ENT as this will be needed for the referral

## 2020-04-09 NOTE — Telephone Encounter (Signed)
Copied from [place] [phone]. Topic: Referral - Request for Referral >> Apr 09, 2020 11:09 AM Rainey Pines A wrote: Patient is wanting a ENT referral for surgical clearance as soon as possible and would like a callback once completed. Please advise

## 2020-04-10 ENCOUNTER — Other Ambulatory Visit: Payer: Self-pay | Admitting: Family Medicine

## 2020-04-10 DIAGNOSIS — Z01818 Encounter for other preprocedural examination: Secondary | ICD-10-CM

## 2020-04-10 DIAGNOSIS — N3000 Acute cystitis without hematuria: Secondary | ICD-10-CM

## 2020-04-10 MED ORDER — NITROFURANTOIN MONOHYD MACRO 100 MG PO CAPS: 100.0000 mg | ORAL_CAPSULE | Freq: Two times a day (BID) | ORAL | 0 refills | Status: AC

## 2020-04-10 NOTE — Telephone Encounter (Signed)
Pt stated that the surgeon's office recommended that she get evaluation for clearance from ENT prior to surgery on her spine because it will involve an incision into the throat and movement of her esophagus.

## 2020-04-10 NOTE — Telephone Encounter (Signed)
ENT referral placed. Please encourage patient to apply for [facility] discount program unless she has another resource to pay for the ENT referral.

## 2020-04-10 NOTE — Telephone Encounter (Signed)
ATC pt to inform of provider's note, no answer, LM to [facility], forwarded to [facility] for follow-up

## 2020-04-12 ENCOUNTER — Telehealth (INDEPENDENT_AMBULATORY_CARE_PROVIDER_SITE_OTHER): Payer: No Payment, Other | Admitting: Psychiatry

## 2020-04-12 ENCOUNTER — Encounter (HOSPITAL_COMMUNITY): Payer: Self-pay | Admitting: Psychiatry

## 2020-04-12 ENCOUNTER — Other Ambulatory Visit: Payer: Self-pay

## 2020-04-12 DIAGNOSIS — F411 Generalized anxiety disorder: Secondary | ICD-10-CM | POA: Diagnosis not present

## 2020-04-12 DIAGNOSIS — F331 Major depressive disorder, recurrent, moderate: Secondary | ICD-10-CM

## 2020-04-12 DIAGNOSIS — F4310 Post-traumatic stress disorder, unspecified: Secondary | ICD-10-CM | POA: Diagnosis not present

## 2020-04-12 MED ORDER — PRAZOSIN HCL 1 MG PO CAPS: 1.0000 mg | ORAL_CAPSULE | Freq: Every day | ORAL | 3 refills | Status: DC

## 2020-04-12 MED ORDER — PAROXETINE HCL 30 MG PO TABS: 30.0000 mg | ORAL_TABLET | Freq: Every day | ORAL | 2 refills | Status: DC

## 2020-04-12 MED ORDER — QUETIAPINE FUMARATE 100 MG PO TABS: 200.0000 mg | ORAL_TABLET | Freq: Every day | ORAL | 2 refills | Status: DC

## 2020-04-12 MED ORDER — TRAZODONE HCL 50 MG PO TABS: 50.0000 mg | ORAL_TABLET | Freq: Every evening | ORAL | 2 refills | Status: DC | PRN

## 2020-04-12 MED ORDER — LORAZEPAM 0.5 MG PO TABS: 0.5000 mg | ORAL_TABLET | Freq: Three times a day (TID) | ORAL | 2 refills | Status: DC
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
## 2020-04-16 ENCOUNTER — Ambulatory Visit (INDEPENDENT_AMBULATORY_CARE_PROVIDER_SITE_OTHER): Payer: No Payment, Other | Admitting: Clinical

## 2020-04-16 ENCOUNTER — Other Ambulatory Visit: Payer: Self-pay

## 2020-04-16 DIAGNOSIS — F331 Major depressive disorder, recurrent, moderate: Secondary | ICD-10-CM

## 2020-04-16 NOTE — Progress Notes (Signed)
   THERAPIST PROGRESS NOTE Virtual Visit via Video Note  I connected with [name] on 04/16/20 at 11:00 AM EDT by a video enabled telemedicine application and verified that I am speaking with the correct person using two identifiers.  Location: Patient: Home Provider: Office   I discussed the limitations of evaluation and management by telemedicine and the availability of in person appointments. The patient expressed understanding and agreed to proceed.    Follow Up Instructions: I discussed the assessment and treatment plan with the patient. The patient was provided an opportunity to ask questions and all were answered. The patient agreed with the plan and demonstrated an understanding of the instructions.   The patient was advised to call back or seek an in-person evaluation if the symptoms worsen or if the condition fails to improve as anticipated.    Session Time: 53 minutes  Participation Level: Active  Behavioral Response: CasualAlertDepressed  Type of Therapy: Individual Therapy  Treatment Goals addressed: Diagnosis: Depression  Interventions: CBT  Summary:  MILEE QUALLS is a 54 y.o. female who presents for the scheduled session oriented times five, appropriately dressed, and friendly. Client denied hallucinations and delusions.  Client reported her depressive symptoms have increased since the last session. Client reported due to financial problems she has been unable to afford her psych medications. Client reported it has been a week since last taking her medication. Client reported within her relationship her boyfriend has been formally diagnosed with Parkinsons which she saw signs of since the beginning of their relationship. Client reported due to his illness and recent family problems his behavior has changed and she is reflecting over the quality of the relationships as she considers to end the relationship or not. Client reported due to her ongoing process  of medical treatment she feels like she has to let go because he is not helpful of her. Client stated, "I feel hopeless in life right now". Client denied suicidal ideation and reported her motivation to be there for her children as they have mutually discussed.  Client engaged with the therapist in change talk. Client engaged with the therapist to discuss her goals and positive aspect as far as what's she doing in her control to help her situation. Client reported as difficult as the process is she is following through on her medical appointments. Client reported in a desired relationship she wants someone who enjoys traveling and values her as she would for someone else.      Suicidal/Homicidal: Nowithout intent/plan  Therapist Response:  Therapist began the session by asking the client how she has been since the last session. Therapist actively listened to the clients thoughts and feelings. Therapist asked open ended questions to assess the clients current depressive symptoms and discussed the stressors affecting how she feels. Therapist used to CBT to engage the client in change talk and using assertiveness.  Therapist gave the client homework to take time to think things through thoroughly before making big decisions and stopping her negative self talk by redirecting her thoughts to positive ones.     Plan: Return again in 3 weeks for individual therapy.  Diagnosis: Major depressive disorder recurrent episode moderate  Birdena Jubilee Adah Stoneberg, LCSW 04/16/2020

## 2020-04-23 ENCOUNTER — Telehealth: Payer: Self-pay | Admitting: Licensed Clinical Social Worker

## 2020-04-23 NOTE — Telephone Encounter (Signed)
Call placed to patient regarding [facility] referral. LCSW left message requesting a return call.  

## 2020-04-24 ENCOUNTER — Telehealth: Payer: Self-pay | Admitting: Family Medicine

## 2020-04-24 NOTE — Telephone Encounter (Signed)
Pt stated her surgery clearance form for [facility] in [place] has not been received / please advise   Dr. Dorothy Puffer cb# [phone]

## 2020-04-24 NOTE — Telephone Encounter (Signed)
Paperwork was originally faxed to Dr. Norva Riffle office in [place] as requested by pt per progress note, faxed to [place] location today. Called and LM informing pt that paperwork was faxed to Dr. Norva Riffle office in [place].

## 2020-04-29 DIAGNOSIS — I82409 Acute embolism and thrombosis of unspecified deep veins of unspecified lower extremity: Secondary | ICD-10-CM | POA: Insufficient documentation

## 2020-04-29 DIAGNOSIS — E559 Vitamin D deficiency, unspecified: Secondary | ICD-10-CM | POA: Insufficient documentation

## 2020-04-29 DIAGNOSIS — Z87442 Personal history of urinary calculi: Secondary | ICD-10-CM | POA: Insufficient documentation

## 2020-04-29 DIAGNOSIS — F32A Depression, unspecified: Secondary | ICD-10-CM | POA: Insufficient documentation

## 2020-04-29 DIAGNOSIS — D649 Anemia, unspecified: Secondary | ICD-10-CM | POA: Insufficient documentation

## 2020-04-29 DIAGNOSIS — R519 Headache, unspecified: Secondary | ICD-10-CM | POA: Insufficient documentation

## 2020-04-29 DIAGNOSIS — R87629 Unspecified abnormal cytological findings in specimens from vagina: Secondary | ICD-10-CM | POA: Insufficient documentation

## 2020-04-29 DIAGNOSIS — J45909 Unspecified asthma, uncomplicated: Secondary | ICD-10-CM | POA: Insufficient documentation

## 2020-04-30 ENCOUNTER — Encounter: Payer: Self-pay | Admitting: Cardiology

## 2020-04-30 ENCOUNTER — Other Ambulatory Visit: Payer: Self-pay

## 2020-04-30 ENCOUNTER — Telehealth: Payer: Self-pay

## 2020-04-30 ENCOUNTER — Ambulatory Visit (INDEPENDENT_AMBULATORY_CARE_PROVIDER_SITE_OTHER): Payer: Medicaid Other | Admitting: Cardiology

## 2020-04-30 ENCOUNTER — Ambulatory Visit (INDEPENDENT_AMBULATORY_CARE_PROVIDER_SITE_OTHER): Payer: No Payment, Other | Admitting: Clinical

## 2020-04-30 VITALS — BP 130/92 | HR 98 | Ht 65.0 in | Wt 243.1 lb

## 2020-04-30 DIAGNOSIS — Z86718 Personal history of other venous thrombosis and embolism: Secondary | ICD-10-CM | POA: Diagnosis not present

## 2020-04-30 DIAGNOSIS — Z01810 Encounter for preprocedural cardiovascular examination: Secondary | ICD-10-CM | POA: Insufficient documentation

## 2020-04-30 DIAGNOSIS — R011 Cardiac murmur, unspecified: Secondary | ICD-10-CM | POA: Insufficient documentation

## 2020-04-30 DIAGNOSIS — F331 Major depressive disorder, recurrent, moderate: Secondary | ICD-10-CM

## 2020-04-30 HISTORY — DX: Cardiac murmur, unspecified: R01.1

## 2020-04-30 HISTORY — DX: Encounter for preprocedural cardiovascular examination: Z01.810

## 2020-04-30 HISTORY — DX: Morbid (severe) obesity due to excess calories: E66.01

## 2020-04-30 NOTE — Telephone Encounter (Signed)
fyi

## 2020-04-30 NOTE — Telephone Encounter (Signed)
   [facility] Pre-operative Risk Assessment    HEARTCARE STAFF: - Please ensure there is not already an duplicate clearance open for this procedure. - Under Visit Info/Reason for Call, type in Other and utilize the format Clearance MM/DD/YY or Clearance TBD. Do not use dashes or single digits. - If request is for dental extraction, please clarify the # of teeth to be extracted.  Request for surgical clearance:  1. What type of surgery is being performed? Cervical or Lumbar Spine Anterior Fusion Surgery   2. When is this surgery scheduled? TBD   3. What type of clearance is required (medical clearance vs. Pharmacy clearance to hold med vs. Both)? Both  4. Are there any medications that need to be held prior to surgery and how long? Any anticogulants 1-2 weeks before surgery and all NSAIDs/anti-inflammatories(including aspirin) for 4-5 months following surgery.   5. Practice name and name of physician performing surgery? [facility]- Dr. Rowan Blase Bendiks   6. What is the office phone number? [phone]   7.   What is the office fax number? [phone]  8.   Anesthesia type (None, local, MAC, general) ? General   Lowella Grip 04/30/2020, 10:29 AM  _________________________________________________________________   (provider comments below)

## 2020-04-30 NOTE — Progress Notes (Signed)
Cardiology Office Note:    Date:  04/30/2020   ID:  [name], DOB 1965-12-15, MRN [id number]  PCP:  Antony Blackbird, MD  Cardiologist:  Jenean Lindau, MD   Referring MD: Antony Blackbird, MD    ASSESSMENT:    1. 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 Preop cardiovascular exam   3. Morbid obesity ([place])   4. Cardiac murmur    PLAN:    In order of problems listed above:  1. Primary prevention stressed with the patient.  Importance of compliance with diet medication stressed and she vocalized understanding. 2. Preoperative cardiovascular evaluation: Patient has risk factors for coronary artery disease.  She leads a sedentary lifestyle because of this accident orthopedic related issues.  Therefore we will do a Lexiscan sestamibi.  If this is negative then she is not at high risk for coronary events during the aforementioned surgery.  Meticulous hemodynamic monitoring will further reduce the risk of coronary events. 3. Cardiac murmur: She has been told about valve problems in the past.  I could not hear a murmur but it does not appear clinically significant.  Details are mentioned below.  In view of history history we will do echocardiogram. 4. Morbid obesity: Diet was emphasized and weight reduction was stressed and risks of obesity explained and she vocalized understanding and she promises to do better. 5. History of DVT in the right lower extremity.  Followed by primary care physician.  Good health practices were advised. 6. History of mixed dyslipidemia: Managed by primary care physician.  Diet was emphasized. 7. Patient will be seen in follow-up appointment in 6 months or earlier if the patient has any concerns   Medication Adjustments/Labs and Tests Ordered: Current medicines are reviewed at length with the patient today.  Concerns regarding medicines are outlined above.  Orders Placed This Encounter  Procedures  . MYOCARDIAL  PERFUSION IMAGING  . EKG 12-Lead  . ECHOCARDIOGRAM COMPLETE   No orders of the defined types were placed in this encounter.    No chief complaint on file.    History of Present Illness:    Casey Jordan is a 54 y.o. female.  Patient has past medical history of mixed dyslipidemia.  She has history of DVT.  She is morbidly obese.  She mentions to me that she was in an accident and needs a spinal surgery for the same.  She denies any chest pain orthopnea or PND however she leads a sedentary lifestyle.  She occasionally has palpitations, these are especially happening when she is nervous.  Transient and not associated with any dizziness or syncope..  At the time of my evaluation, the patient is alert awake oriented and in no distress.  She is needing preoperative assessment and therefore she is here.  She is morbidly obese.  Because of the accident she says that she cannot ambulate much because of back pain issues.  Past Medical History:  Diagnosis Date  . Anemia   . Asthma   . Depression   . Dizziness 07/05/2018   Formatting of this note might be different from the original. Onset December 2019.  MRI of the brain okay.  Last Assessment & Plan:  Formatting of this note might be different from the original. Complex disease history.  Onset back in December.  Since then she has experienced syncopal episodes on at least 2 occasions.  Frequent spinning episodes and almost constant unsteadiness that is improved  wi  . DVT (deep venous thrombosis) (H[facility])    lower leg while traveling went to lung  . GAD (generalized anxiety disorder) 08/06/2017  . GERD (gastroesophageal reflux disease)   . Headache    Migraines  . 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 Anticoagulation x 6 months; no recurrence.  Dx 2010 Formatting of this note might be different from the original. Overview:   Anticoagulation x 6 months; no recurrence.  Dx 2010  . History of fusion of cervical spine 12/21/2016   Formatting of this note might be different from the original. 2011-2012 Formatting of this note might be different from the original. Overview:  2011-2012  . History of kidney stones   . Left ureteral calculus 11/18/2017  . Major depressive disorder, recurrent episode, moderate ([place]) 12/28/2019  . Mild intermittent asthma without complication 10/08/252  . Mixed hyperlipidemia 06/29/2017   Formatting of this note might be different from the original. 10 year CVD risk 5%  . Nausea and vomiting 06/25/2018  . Obesity (BMI 30-39.9) 12/21/2016  . Post-traumatic stress disorder, unspecified 12/21/2018  . Restless leg syndrome 12/21/2016  . Thrombophlebitis arm 07/06/2018   Formatting of this note might be different from the original. 06/2018: Noted on [place] RUE  . Vaginal Pap smear, abnormal   . Vitamin D insufficiency     Past Surgical History:  Procedure Laterality Date  . CHOLECYSTECTOMY    . COLONOSCOPY    . COLPOSCOPY    . HYSTEROSCOPY WITH D & C N/A 04/02/2017   Procedure: DILATATION AND CURETTAGE /HYSTEROSCOPY;  Surgeon: Linda Hedges, DO;  Location: [facility];  Service: Gynecology;  Laterality: N/A;  . KIDNEY STONE SURGERY    . OVARY SURGERY    . POSTERIOR FUSION CERVICAL SPINE    . TONSILLECTOMY      Current Medications: Current Meds  Medication Sig  . albuterol (VENTOLIN HFA) 108 (90 Base) MCG/ACT inhaler Inhale 2 puffs into the lungs every 6 (six) hours as needed for wheezing or shortness of breath.  . cyclobenzaprine (FLEXERIL) 10 MG tablet Take 1 tablet (10 mg total) by mouth 2 (two) times daily as needed for muscle spasms.  . famotidine (PEPCID) 20 MG tablet Take 1 tablet (20 mg total) by mouth 2 (two) times daily.  Marland Kitchen gabapentin (NEURONTIN) 300 MG capsule Take 1 capsule (300 mg total) by mouth 3 (three) times daily.  Marland Kitchen ibuprofen (ADVIL) 600 MG tablet Take 1 tablet (600 mg total) by mouth  every 8 (eight) hours as needed. For pain  . ipratropium-albuterol (DUONEB) 0.5-2.5 (3) MG/3ML SOLN 1 nebule every 4 to 6 hrs prn wheezing  . LORazepam (ATIVAN) 0.5 MG tablet Take 1 tablet (0.5 mg total) by mouth in the morning, at noon, and at bedtime.  . meclizine (ANTIVERT) 25 MG tablet Take 1 tablet (25 mg total) by mouth 3 (three) times daily as needed for dizziness.  . methocarbamol (ROBAXIN) 500 MG tablet Take 1 tablet (500 mg total) by mouth every 8 (eight) hours as needed for muscle spasms.  . Multiple Vitamin (MULTIVITAMIN WITH MINERALS) TABS tablet Take 1 tablet by mouth daily.  Marland Kitchen omeprazole (PRILOSEC) 40 MG capsule Take 1 capsule (40 mg total) by mouth 2 (two) times daily.  Marland Kitchen PARoxetine (PAXIL) 30 MG tablet Take 1 tablet (30 mg total) by mouth daily.  . prazosin (MINIPRESS) 1 MG capsule Take 1 capsule (1  mg total) by mouth at bedtime.  . promethazine (PHENERGAN) 12.5 MG tablet Take 1 tablet (12.5 mg total) by mouth every 8 (eight) hours as needed for nausea or vomiting.  Marland Kitchen QUEtiapine (SEROQUEL) 100 MG tablet Take 2 tablets (200 mg total) by mouth at bedtime.  . traZODone (DESYREL) 50 MG tablet Take 1 tablet (50 mg total) by mouth at bedtime as needed for sleep.     Allergies:   Codeine, Naproxen, Avocado, Sulfa antibiotics, Fluconazole, Sulfamethoxazole, Sulfasalazine, Nickel, Penicillins, and Zofran [ondansetron]   Social History   Socioeconomic History  . Marital status: Divorced    Spouse name: Not on file  . Number of children: Not on file  . Years of education: Not on file  . Highest education level: Not on file  Occupational History  . Not on file  Tobacco Use  . Smoking status: Never Smoker  . Smokeless tobacco: Never Used  Vaping Use  . Vaping Use: Never used  Substance and Sexual Activity  . Alcohol use: Not Currently    Comment: social  . Drug use: No  . Sexual activity: Yes    Birth control/protection: None  Other Topics Concern  . Not on file  Social  History Narrative  . Not on file   Social Determinants of Health   Financial Resource Strain:   . Difficulty of Paying Living Expenses: Not on file  Food Insecurity:   . Worried About Charity fundraiser in the Last Year: Not on file  . Ran Out of Food in the Last Year: Not on file  Transportation Needs:   . Lack of Transportation (Medical): Not on file  . Lack of Transportation (Non-Medical): Not on file  Physical Activity:   . Days of Exercise per Week: Not on file  . Minutes of Exercise per Session: Not on file  Stress:   . Feeling of Stress : Not on file  Social Connections:   . Frequency of Communication with Friends and Family: Not on file  . Frequency of Social Gatherings with Friends and Family: Not on file  . Attends Religious Services: Not on file  . Active Member of Clubs or Organizations: Not on file  . Attends Archivist Meetings: Not on file  . Marital Status: Not on file     Family History: The patient's family history includes Diabetes in her father and mother; Heart disease in her father; Kidney disease in her father.  ROS:   Please see the history of present illness.    All other systems reviewed and are negative.  EKGs/Labs/Other Studies Reviewed:    The following studies were reviewed today: EKG reveals sinus rhythm and nonspecific ST-T changes   Recent Labs: 03/06/2020: TSH 2.090 04/05/2020: ALT 43; BUN 13; Creatinine, Ser 0.67; Hemoglobin 13.2; Platelets 405; Potassium 4.4; Sodium 140  Recent Lipid Panel No results found for: CHOL, TRIG, HDL, CHOLHDL, VLDL, LDLCALC, LDLDIRECT  Physical Exam:    VS:  BP (!) 130/92   Pulse 98   Ht 5\' 5"  (1.651 m)   Wt 243 lb 1.3 oz (110.3 kg)   SpO2 96%   BMI 40.45 kg/m     Wt Readings from Last 3 Encounters:  04/30/20 243 lb 1.3 oz (110.3 kg)  04/05/20 235 lb 6.4 oz (106.8 kg)  03/06/20 236 lb 3.2 oz (107.1 kg)     GEN: Patient is in no acute distress HEENT: Normal NECK: No JVD; No carotid  bruits LYMPHATICS: No lymphadenopathy CARDIAC: Hear  sounds regular, 2/6 systolic murmur at the apex. RESPIRATORY:  Clear to auscultation without rales, wheezing or rhonchi  ABDOMEN: Soft, non-tender, non-distended MUSCULOSKELETAL:  No edema; No deformity  SKIN: Warm and dry NEUROLOGIC:  Alert and oriented x 3 PSYCHIATRIC:  Normal affect   Signed, Jenean Lindau, MD  04/30/2020 10:38 AM    [place]

## 2020-04-30 NOTE — Telephone Encounter (Signed)
Casey Jordan 54 year old female was seen by you in clinic today.  [facility] submitted a cardiac clearance request today as well.  Given that you have ordered a Lexiscan and echocardiogram.  I will defer her cardiac clearance to you pending her exam results.  Once her echocardiogram and Lexiscan have resulted, please forward your note to requesting office.  I will remove [name] from the preoperative pool.  Thank you for your help.  Casey Jordan. Casey Tusing NP-C    04/30/2020, 10:48 AM [address] Office [phone] Fax [phone]

## 2020-04-30 NOTE — Patient Instructions (Addendum)
Medication Instructions:  No medication changes. *If you need a refill on your cardiac medications before your next appointment, please call your pharmacy*   Lab Work: None ordered If you have labs (blood work) drawn today and your tests are completely normal, you will receive your results only by: Marland Kitchen MyChart Message (if you have MyChart) OR . A paper copy in the mail If you have any lab test that is abnormal or we need to change your treatment, we will call you to review the results.   Testing/Procedures: Your physician has requested that you have an echocardiogram. Echocardiography is a painless test that uses sound waves to create images of your heart. It provides your doctor with information about the size and shape of your heart and how well your heart's chambers and valves are working. This procedure takes approximately one hour. There are no restrictions for this procedure.  Your physician has requested that you have a lexiscan myoview. For further information please visit [email or web address]. Please follow instruction sheet, as given.  The test will be done over 2 days and take approximately 3 to 4 hours each day to complete; you may bring reading material.  If someone comes with you to your appointment, they will need to remain in the main lobby due to limited space in the testing area. **If you are pregnant or breastfeeding, please notify the nuclear lab prior to your appointment**  How to prepare for your Myocardial Perfusion Test: . Do not eat or drink 3 hours prior to your test, except you may have water. . Do not consume products containing caffeine (regular or decaffeinated) 12 hours prior to your test. (ex: coffee, chocolate, sodas, tea). . Do bring a list of your current medications with you.  If not listed below, you may take your medications as normal. . Do wear comfortable clothes (no dresses or overalls) and walking shoes, tennis shoes preferred (No heels or open toe shoes  are allowed). . Do NOT wear cologne, perfume, aftershave, or lotions (deodorant is allowed). . If these instructions are not followed, your test will have to be rescheduled.     Follow-Up: At [facility], you and your health needs are our priority.  As part of our continuing mission to provide you with exceptional heart care, we have created designated Provider Care Teams.  These Care Teams include your primary Cardiologist (physician) and Advanced Practice Providers (APPs -  Physician Assistants and Nurse Practitioners) who all work together to provide you with the care you need, when you need it.  We recommend signing up for the patient portal called "MyChart".  Sign up information is provided on this After Visit Summary.  MyChart is used to connect with patients for Virtual Visits (Telemedicine).  Patients are able to view lab/test results, encounter notes, upcoming appointments, etc.  Non-urgent messages can be sent to your provider as well.   To learn more about what you can do with MyChart, go to [email or web address].    Your next appointment:   4 month(s)  The format for your next appointment:   In Person  Provider:   Jyl Heinz, MD   Other Instructions  Cardiac Nuclear Scan A cardiac nuclear scan is a test that is done to check the flow of blood to your heart. It is done when you are resting and when you are exercising. The test looks for problems such as:  Not enough blood reaching a portion of the heart.  The heart muscle not working as  it should. You may need this test if:  You have heart disease.  You have had lab results that are not normal.  You have had heart surgery or a balloon procedure to open up blocked arteries (angioplasty).  You have chest pain.  You have shortness of breath. In this test, a special dye (tracer) is put into your bloodstream. The tracer will travel to your heart. A camera will then take pictures of your heart to see how the  tracer moves through your heart. This test is usually done at a hospital and takes 2-4 hours. Tell a doctor about:  Any allergies you have.  All medicines you are taking, including vitamins, herbs, eye drops, creams, and over-the-counter medicines.  Any problems you or family members have had with anesthetic medicines.  Any blood disorders you have.  Any surgeries you have had.  Any medical conditions you have.  Whether you are pregnant or may be pregnant. What are the risks? Generally, this is a safe test. However, problems may occur, such as:  Serious chest pain and heart attack. This is only a risk if the stress portion of the test is done.  Rapid heartbeat.  A feeling of warmth in your chest. This feeling usually does not last long.  Allergic reaction to the tracer. What happens before the test?  Ask your doctor about changing or stopping your normal medicines. This is important.  Follow instructions from your doctor about what you cannot eat or drink.  Remove your jewelry on the day of the test. What happens during the test?  An IV tube will be inserted into one of your veins.  Your doctor will give you a small amount of tracer through the IV tube.  You will wait for 20-40 minutes while the tracer moves through your bloodstream.  Your heart will be monitored with an electrocardiogram (ECG).  You will lie down on an exam table.  Pictures of your heart will be taken for about 15-20 minutes.  You may also have a stress test. For this test, one of these things may be done: ? You will be asked to exercise on a treadmill or a stationary bike. ? You will be given medicines that will make your heart work harder. This is done if you are unable to exercise.  When blood flow to your heart has peaked, a tracer will again be given through the IV tube.  After 20-40 minutes, you will get back on the exam table. More pictures will be taken of your heart.  Depending on the  tracer that is used, more pictures may need to be taken 3-4 hours later.  Your IV tube will be removed when the test is over. The test may vary among doctors and hospitals. What happens after the test?  Ask your doctor: ? Whether you can return to your normal schedule, including diet, activities, and medicines. ? Whether you should drink more fluids. This will help to remove the tracer from your body. Drink enough fluid to keep your pee (urine) pale yellow.  Ask your doctor, or the department that is doing the test: ? When will my results be ready? ? How will I get my results? Summary  A cardiac nuclear scan is a test that is done to check the flow of blood to your heart.  Tell your doctor whether you are pregnant or may be pregnant.  Before the test, ask your doctor about changing or stopping your normal medicines. This is  important.  Ask your doctor whether you can return to your normal activities. You may be asked to drink more fluids. This information is not intended to replace advice given to you by your health care provider. Make sure you discuss any questions you have with your health care provider. Document Revised: 10/26/2018 Document Reviewed: 12/20/2017 Elsevier Patient Education  [place].  Echocardiogram An echocardiogram is a procedure that uses painless sound waves (ultrasound) to produce an image of the heart. Images from an echocardiogram can provide important information about:  Signs of coronary artery disease (CAD).  Aneurysm detection. An aneurysm is a weak or damaged part of an artery wall that bulges out from the normal force of blood pumping through the body.  Heart size and shape. Changes in the size or shape of the heart can be associated with certain conditions, including heart failure, aneurysm, and CAD.  Heart muscle function.  Heart valve function.  Signs of a past heart attack.  Fluid buildup around the heart.  Thickening of the  heart muscle.  A tumor or infectious growth around the heart valves. Tell a health care provider about:  Any allergies you have.  All medicines you are taking, including vitamins, herbs, eye drops, creams, and over-the-counter medicines.  Any blood disorders you have.  Any surgeries you have had.  Any medical conditions you have.  Whether you are pregnant or may be pregnant. What are the risks? Generally, this is a safe procedure. However, problems may occur, including:  Allergic reaction to dye (contrast) that may be used during the procedure. What happens before the procedure? No specific preparation is needed. You may eat and drink normally. What happens during the procedure?   An IV tube may be inserted into one of your veins.  You may receive contrast through this tube. A contrast is an injection that improves the quality of the pictures from your heart.  A gel will be applied to your chest.  A wand-like tool (transducer) will be moved over your chest. The gel will help to transmit the sound waves from the transducer.  The sound waves will harmlessly bounce off of your heart to allow the heart images to be captured in real-time motion. The images will be recorded on a computer. The procedure may vary among health care providers and hospitals. What happens after the procedure?  You may return to your normal, everyday life, including diet, activities, and medicines, unless your health care provider tells you not to do that. Summary  An echocardiogram is a procedure that uses painless sound waves (ultrasound) to produce an image of the heart.  Images from an echocardiogram can provide important information about the size and shape of your heart, heart muscle function, heart valve function, and fluid buildup around your heart.  You do not need to do anything to prepare before this procedure. You may eat and drink normally.  After the echocardiogram is completed, you may  return to your normal, everyday life, unless your health care provider tells you not to do that. This information is not intended to replace advice given to you by your health care provider. Make sure you discuss any questions you have with your health care provider. Document Revised: 10/27/2018 Document Reviewed: 08/08/2016 Elsevier Patient Education  [place].

## 2020-05-01 NOTE — Progress Notes (Signed)
   THERAPIST PROGRESS NOTE Virtual Visit via Video Note  I connected with Casey Jordan on 04/30/2020 at  2:00 PM EDT by a video enabled telemedicine application and verified that I am speaking with the correct person using two identifiers.  Location: Patient: Home Provider: Office   I discussed the limitations of evaluation and management by telemedicine and the availability of in person appointments. The patient expressed understanding and agreed to proceed.   Follow Up Instructions: I discussed the assessment and treatment plan with the patient. The patient was provided an opportunity to ask questions and all were answered. The patient agreed with the plan and demonstrated an understanding of the instructions.   The patient was advised to call back or seek an in-person evaluation if the symptoms worsen or if the condition fails to improve as anticipated.     Session Time: 45 minutes  Participation Level: Active  Behavioral Response: CasualAlertDepressed  Type of Therapy: Individual Therapy  Treatment Goals addressed: Diagnosis: Depression  Interventions: CBT  Summary:  Casey Jordan is a 54 y.o. female who presents for the scheduled session oriented times five, appropriately dressed, and friendly. Client denied hallucinations and delusions. Client reported on today feeling "okay". Client reported since the last session she has seen her doctor and they want to run some more tests to make sure she is a good candidate for back/neck surgeory. Client reported at her last psychiatry visit the doctor added Trazadone to her medication because she was having difficulty with nightmares. Client reported even with trying to cut it in half she still experienced nausea and vomiting. Client reported she stopped taking it. Client reported she has also been in contact with her ex passively a she works on decreasing her interaction with his grand kids and family. Client reported she  struggles with cycling thoughts about wondering what he's doing and missing him. Client reported it seems to annoy others when she talks about him. Client reported she knows that long term he is not the kind of person she would want to be with but still has uncertainties about what she should do about him in her life. Client reported she gets depressed and thinks about her chronic pain/ disability to withstand doing things for long periods of time and not feeling that she will be able to meet other men. Client engaged with the therapist in discussion about redirecting her thoughts.    Suicidal/Homicidal: Nowithout intent/plan  Therapist Response:  Therapist began the session by checking in and asking the client how she has been since last seen. Therapist allowed time to focus on the clients thoughts and feelings. Therapist asked open ended questions to assess the clients response to changes made with her medication regimen. Therapist discussed with the client thinking errors of focusing on the negative and countering it with acknowledging the positives. Therapist encouraged the client to do it as homework by challenging cycling negative thoughts to decrease thoughts based on emotions that assume the future. Therapist assisted with scheduling next appointments.    Plan: Return again in 2 weeks for individual therapy.  Diagnosis: Major depressive disorder recurrent episode, moderate    Birdena Jubilee [place] Sorn, LCSW 05/01/2020

## 2020-05-03 ENCOUNTER — Telehealth: Payer: Self-pay | Admitting: Licensed Clinical Social Worker

## 2020-05-03 NOTE — Telephone Encounter (Signed)
Call placed to patient regarding [facility] referral. LCSW left message requesting a return call.  

## 2020-05-09 ENCOUNTER — Telehealth: Payer: Self-pay | Admitting: Licensed Clinical Social Worker

## 2020-05-09 NOTE — Telephone Encounter (Signed)
Return call placed to patient. LCSW left message requesting a callback. 

## 2020-05-15 ENCOUNTER — Ambulatory Visit (INDEPENDENT_AMBULATORY_CARE_PROVIDER_SITE_OTHER): Payer: No Payment, Other | Admitting: Clinical

## 2020-05-15 ENCOUNTER — Other Ambulatory Visit: Payer: Self-pay

## 2020-05-15 DIAGNOSIS — F331 Major depressive disorder, recurrent, moderate: Secondary | ICD-10-CM

## 2020-05-15 NOTE — Progress Notes (Signed)
THERAPIST PROGRESS NOTE Virtual Visit via Video Note  I connected with Casey Jordan on 05/15/20 at  8:00 AM EDT by a video enabled telemedicine application and verified that I am speaking with the correct person using two identifiers.  Location: Patient: Home Provider: Office   I discussed the limitations of evaluation and management by telemedicine and the availability of in person appointments. The patient expressed understanding and agreed to proceed.   Follow Up Instructions:  I discussed the assessment and treatment plan with the patient. The patient was provided an opportunity to ask questions and all were answered. The patient agreed with the plan and demonstrated an understanding of the instructions.   The patient was advised to call back or seek an in-person evaluation if the symptoms worsen or if the condition fails to improve as anticipated.    Session Time: 40 minutes  Participation Level: Active  Behavioral Response: CasualAlertDepressed  Type of Therapy: Individual Therapy  Treatment Goals addressed: Diagnosis: depression  Interventions: CBT and Supportive  Summary:  Casey Jordan is a 54 y.o. female who presents for the scheduled session oriented times five, appropriately dressed, and cooperative. Client denied hallucinations and delusions. Client reported on today she did not sleep during the night. Client reported she has been depressed and not doing well. Client reported next week [facility] to assure that she is going to be a good candidate for surgery. Client reported she has not been eating, not sleeping, and has had crying spells. Client reported she believes the origin of her symptoms is worried about paying her bills, her back hurting/ being in pain constantly, and she has not heard of a set date for her disability with the court. Client reported she does not think her medications are working but also  attributes it to anxiousness about the testing she has to do and worried about surgery. Client reported having thoughts of "what if I die" or have complications from the surgery. Client reported " all my life I've been able to pick myself up but how many times do I h ave to get up". Client reported she and her sons have been depressed lately but she has been encouraging them but she is unable to do so for herself. Client discussed with the therapist some of childhood experiences as it relates to her feelings of anxiety and "flight" feelings. Client engaged with the therapist in discussion about what the urgency of her symptoms are. Client reported she is having a hard time coping with everything going on with her health and disability hearing. Client contracted for safety to the therapist. Client engaged with the therapist to discuss safety planning. Client reported she will call her mother and sons in the event she needs help arranging transportation.     Suicidal/Homicidal: Nowithout intent/plan  Therapist Response:  Therapist began the session by checking in and asking the client how she has been doing since the last session. Therapist actively listened and used empathy to communicate as the client described her thoughts and feelings. Therapist engaged the client in conversation by asking open ended questions identifying the clients source of stressors impacting her symptoms. Therapist used CBT to engage the client in practice of challenging her negative thoughts.  Therapist engaged with the client to discuss safety planning if she is having a crisis to engage with supports and she was informed of how to access crisis services at [facility]. Therapist assisted  with scheduling next appointments.     Plan: Return again in 2 weeks for individual therapy.  Diagnosis: Major depressive disorder, recurrent episode, moderate   Birdena Jubilee Alden Feagan, LCSW 05/15/2020

## 2020-05-16 ENCOUNTER — Telehealth (HOSPITAL_COMMUNITY): Payer: Self-pay

## 2020-05-16 NOTE — Telephone Encounter (Signed)
Spoke with the patient, detailed instructions were given. She stated that she understood and would be here for her test. Asked to call back with any questions. S.Tracy Gerken EMTP 

## 2020-05-21 ENCOUNTER — Ambulatory Visit (HOSPITAL_COMMUNITY): Payer: Medicaid Other | Attending: Cardiovascular Disease

## 2020-05-21 ENCOUNTER — Other Ambulatory Visit: Payer: Self-pay

## 2020-05-21 DIAGNOSIS — Z01810 Encounter for preprocedural cardiovascular examination: Secondary | ICD-10-CM | POA: Insufficient documentation

## 2020-05-21 MED ORDER — REGADENOSON 0.4 MG/5ML IV SOLN
0.4000 mg | Freq: Once | INTRAVENOUS | Status: AC
  Administered 2020-05-21: 0.4 mg via INTRAVENOUS

## 2020-05-21 MED ORDER — TECHNETIUM TC 99M TETROFOSMIN IV KIT
30.6000 | PACK | Freq: Once | INTRAVENOUS | Status: AC | PRN
  Administered 2020-05-21: 30.6 via INTRAVENOUS
  Filled 2020-05-21: qty 31

## 2020-05-22 ENCOUNTER — Ambulatory Visit (HOSPITAL_BASED_OUTPATIENT_CLINIC_OR_DEPARTMENT_OTHER): Payer: Medicaid Other

## 2020-05-22 ENCOUNTER — Ambulatory Visit (HOSPITAL_COMMUNITY): Payer: Medicaid Other | Attending: Cardiology

## 2020-05-22 DIAGNOSIS — Z01810 Encounter for preprocedural cardiovascular examination: Secondary | ICD-10-CM

## 2020-05-22 LAB — MYOCARDIAL PERFUSION IMAGING
LV dias vol: 75 mL (ref 46–106)
LV sys vol: 33 mL
Peak HR: 116 {beats}/min
Rest HR: 92 {beats}/min
SDS: 1
SRS: 0
SSS: 1
TID: 1.01

## 2020-05-22 LAB — ECHOCARDIOGRAM COMPLETE
Area-P 1/2: 3.06 cm2
S' Lateral: 3.7 cm

## 2020-05-22 MED ORDER — PERFLUTREN LIPID MICROSPHERE
1.0000 mL | INTRAVENOUS | Status: AC | PRN
  Administered 2020-05-22: 2 mL via INTRAVENOUS

## 2020-05-22 MED ORDER — TECHNETIUM TC 99M TETROFOSMIN IV KIT
29.4000 | PACK | Freq: Once | INTRAVENOUS | Status: AC | PRN
  Administered 2020-05-22: 29.4 via INTRAVENOUS
  Filled 2020-05-22: qty 30

## 2020-05-23 NOTE — Telephone Encounter (Signed)
Last office note and results of echo/lexi faxed to [facility] for pre op.

## 2020-05-24 ENCOUNTER — Telehealth: Payer: Self-pay | Admitting: Family Medicine

## 2020-05-24 NOTE — Telephone Encounter (Signed)
Pt called to see if Clearance paperwork can be sent as well as EKG to [facility] / they only received the blood work / pleas fax to Attention Rashida @ [phone] / please advise asap

## 2020-05-29 ENCOUNTER — Other Ambulatory Visit: Payer: Self-pay

## 2020-05-30 ENCOUNTER — Telehealth: Payer: Self-pay | Admitting: Cardiology

## 2020-05-30 NOTE — Telephone Encounter (Signed)
Patient called again to request the paperwork be sent over to [facility]. She can't have surgery until they receive the clearance paperwork along with the EKG. She needs this done ASAP. She can be reached at [phone]. Please advise.

## 2020-05-30 NOTE — Telephone Encounter (Signed)
Refaxed as requested. 

## 2020-05-30 NOTE — Telephone Encounter (Signed)
Casey Jordan is calling requesting her most recent OV notes and test results from 05/21/20 & 05/22/20 be faxed over to [facility] stating " ATTN: Rashida". The fax number is [phone]. The phone number to that office is [phone]. Please advise.

## 2020-05-31 NOTE — Telephone Encounter (Signed)
Confirmation that fax was successful.

## 2020-06-03 ENCOUNTER — Telehealth: Payer: Self-pay | Admitting: Family Medicine

## 2020-06-03 NOTE — Telephone Encounter (Signed)
Patient states [facility] is still requesting verification stating that patient has been cleared. Please fax verification to [phone].

## 2020-06-03 NOTE — Telephone Encounter (Signed)
Contacted pt about paperwork,she advised to contact [facility]- Contacted [facility] spoke w/Adrianna states that she will refax surgical clearance for the pt.

## 2020-06-03 NOTE — Telephone Encounter (Signed)
Copied from [place] [id number]. Topic: General - Inquiry >> Jun 03, 2020  2:06 PM Scherrie Gerlach wrote: Reason for CRM: pt saw Dr Chapman Fitch on 9/17 and brought a surgical clearance form with her.  Pt states the dr has not received the form yet and she needs that faxed to them asap.  It is Dr Dorothy Puffer that is doing the surgery. All her other drs have sent theirs in, waiting on Dr Wolfgang [place] Fax it to [phone]  Please call her wne this is done

## 2020-06-03 NOTE — Telephone Encounter (Signed)
   Primary Cardiologist: Jenean Lindau, MD  Chart reviewed as part of pre-operative protocol coverage. reviwed stress test and echo result. Given past medical history and time since last visit, based on ACC/AHA guidelines, Ilayda C Berline Lopes would be at acceptable risk for the planned procedure without further cardiovascular testing.   Dr. Geraldo Pitter, Please give your recommendations regarding ASA holding?  [place], [place] 06/03/2020, 3:55 PM

## 2020-06-03 NOTE — Telephone Encounter (Signed)
Please see patient's last visit note which contains the name and fax number for the surgery group.  Please call and ask for a new clearance form as previously discussed.  I believe the patient was previously given a surgical clearance form and a copy of her EKG.  Please obtain new form so that this can be faxed back to her surgery group.

## 2020-06-03 NOTE — Telephone Encounter (Signed)
All test has been completed and faxed but [facility] requesting form.

## 2020-06-04 NOTE — Telephone Encounter (Signed)
Ok to hold

## 2020-06-05 ENCOUNTER — Telehealth: Payer: Self-pay | Admitting: Licensed Clinical Social Worker

## 2020-06-05 NOTE — Telephone Encounter (Signed)
Call placed to patient. LCSW introduced self and explained role at [facility]. Pt shared that she currently receives behavioral health services through [facility]. She reports that she is uninsured and needs to schedule appointments with specialists; however, she is unable to afford out of pocket costs.   LCSW informed patient of benefits with [employer] and mailed packet to residence on file, with pt's permission. Pt was strongly encouraged to contact LCSW with any additional questions or concerns.   Pt requested confirmation that surgery clearance has been completed correctly (checking off the yes/no box that she is cleared for surgery) and ensuring it was faxed. Message has been sent to pt's nurse and PCP.

## 2020-06-05 NOTE — Telephone Encounter (Signed)
Please also ask patient to have her cardiologist complete per-operative clearance form

## 2020-06-06 NOTE — Telephone Encounter (Signed)
PRE-OPERATIVE CLEARANCE FORM HAS BEEN FAXED TO [phone]

## 2020-06-11 ENCOUNTER — Telehealth: Payer: Self-pay | Admitting: *Deleted

## 2020-06-11 NOTE — Telephone Encounter (Signed)
   [facility] Pre-operative Risk Assessment    Request for surgical clearance:  1. What type of surgery is being performed? Cervical or Lumbar Spine Anterior Fusion Surgery   2. When is this surgery scheduled? TBD   3. What type of clearance is required (medical clearance vs. Pharmacy clearance to hold med vs. Both)? Both  4. Are there any medications that need to be held prior to surgery and how long?None   5. Practice name and name of physician performing surgery? Dr. Milana Obey   6. What is your office phone number [phone] ext. [phone]    7.   What is your office fax number [phone]  8.   Anesthesia type (None, local, MAC, general) General   Casey Jordan 06/11/2020, 5:03 PM  _________________________________________________________________   (provider comments below)

## 2020-06-11 NOTE — Telephone Encounter (Signed)
   Primary Cardiologist: Jenean Lindau, MD  Chart reviewed as part of pre-operative protocol coverage. Patient was recently seen by Dr. Geraldo Pitter on 04/30/2020 at which time she mentioned need for upcoming spinal surgery. Myoview and Echo were ordered for further evaluation. Myoview was low risk with no evidence of ischemia. Echo showed LVEF of 50-55% with mild LVH and no significant valvular disease. Per Dr. Julien Nordmann office visit note, "If Myoview is negative, then she is not at high risk for coronary events during aforementioned surgery. Meticulous hemodynamic monitoring will further reduce the risk of coronary events." Therefore, no additional testing needed prior to surgery.  I will route this recommendation to the requesting party via Epic fax function and remove from pre-op pool.  Please call with questions.  Darreld Mclean, PA-C 06/11/2020, 5:31 PM

## 2020-06-17 ENCOUNTER — Telehealth (HOSPITAL_COMMUNITY): Payer: No Payment, Other | Admitting: Psychiatry

## 2020-06-18 ENCOUNTER — Telehealth (INDEPENDENT_AMBULATORY_CARE_PROVIDER_SITE_OTHER): Payer: No Payment, Other | Admitting: Psychiatry

## 2020-06-18 ENCOUNTER — Other Ambulatory Visit: Payer: Self-pay

## 2020-06-18 ENCOUNTER — Encounter (HOSPITAL_COMMUNITY): Payer: Self-pay | Admitting: Psychiatry

## 2020-06-18 DIAGNOSIS — F323 Major depressive disorder, single episode, severe with psychotic features: Secondary | ICD-10-CM

## 2020-06-18 DIAGNOSIS — F4310 Post-traumatic stress disorder, unspecified: Secondary | ICD-10-CM

## 2020-06-18 DIAGNOSIS — F411 Generalized anxiety disorder: Secondary | ICD-10-CM | POA: Diagnosis not present

## 2020-06-18 HISTORY — DX: Post-traumatic stress disorder, unspecified: F43.10

## 2020-06-18 HISTORY — DX: Major depressive disorder, single episode, severe with psychotic features: F32.3

## 2020-06-18 MED ORDER — TRAZODONE HCL 50 MG PO TABS: 50.0000 mg | ORAL_TABLET | Freq: Every evening | ORAL | 2 refills | Status: DC | PRN

## 2020-06-18 MED ORDER — PRAZOSIN HCL 1 MG PO CAPS: 1.0000 mg | ORAL_CAPSULE | Freq: Every day | ORAL | 3 refills | Status: DC

## 2020-06-18 MED ORDER — QUETIAPINE FUMARATE 200 MG PO TABS: 200.0000 mg | ORAL_TABLET | Freq: Every day | ORAL | 2 refills | Status: DC

## 2020-06-18 MED ORDER — PAROXETINE HCL 40 MG PO TABS: 40.0000 mg | ORAL_TABLET | Freq: Every day | ORAL | 2 refills | Status: DC

## 2020-06-18 MED ORDER — LORAZEPAM 0.5 MG PO TABS: 0.5000 mg | ORAL_TABLET | Freq: Three times a day (TID) | ORAL | 2 refills | Status: DC

## 2020-06-18 MED ORDER — HYDROXYZINE HCL 10 MG PO TABS: 10.0000 mg | ORAL_TABLET | Freq: Three times a day (TID) | ORAL | 2 refills | Status: DC | PRN

## 2020-06-18 NOTE — Progress Notes (Signed)
BH MD/PA/NP OP Progress Note Virtual Visit via Video Note  I connected with Casey Jordan on 06/18/20 at  9:00 AM EST by a video enabled telemedicine application and verified that I am speaking with the correct person using two identifiers.  Location: Patient: Home Provider: Clinic   I discussed the limitations of evaluation and management by telemedicine and the availability of in person appointments. The patient expressed understanding and agreed to proceed.  I provided 30 minutes of non-face-to-face time during this encounter.     06/18/2020 9:51 AM SHAMAR KRACKE  MRN:  [id number]  Chief Complaint: "I feel like a loser right now'  HPI: 54  year old female seen today for follow up psychiatric evaluation. She has a psychiatric history of anxiety, depression, and PTSD. She is currently managed on Ativan 0.5 mg three times daily, prazosin 1 mg nightly, trazodone 50 mg nightly Paxil 30 mg daily, and Seroquel 200 mg nightly. She noted that her medications are somewhat effective in managing her psychiatric conditions.  Today she is well-groomed, pleasant, cooperative, engaged in conversation, and maintained eye contact.  Patient observed lying in her bed.  She informed provider that for the last 3 weeks she has had congestion, diarrhea, and vomiting.  She informed provider that she was tested for Covid 3 times and her results were negative.  She reporteded that she feels that her symptoms are exacerbated by her anxiety.  Provider conducted a GAD-7 and patient scored a 18.  Provider also conducted a PHQ-9 and patient scored a 21.  She informed provider that her physical health is making her mental health worse.  She noted that she is unable to ambulate as much as she would like to because of back pain.  She reported that this makes her feel depressed.  She also noted that she thinks about death however reported that she does not want to harm herself.  She informed provider that she is  concerned that something will go wrong with surgery which will render her paralyzed.  She endorses VAH noting that she sees a demonic-like female about once a week.  She also noted that she hears noises at times.  She denies paranoia.  Patient noted that her nightmares has reduced from daily to 3 times a month since being on prazosin.  Patient informed Probation officer that she is sad because she would not be able to provide Christmas presents for her children.  She also noted that on Thanksgiving she was sad because she was unable to cook and her children and reported that they spend the holidays with their father.  She feels like her physical health causes her to be isolated.  She is agreeable to increase Paxil 30 mg to 40 mg to help manage anxiety and depreesion.  She is also agreeable to starting hydroxyzine 10 mg 3 times daily to help manage anxiety. She will continue all other medications as prescribed. Potential side effects of medication and risks vs benefits of treatment vs non-treatment were explained and discussed. All questions were answered.  We will follow up with outpatient counseling for therapy no other concerns notes at this time.    Visit Diagnosis:    ICD-10-CM   1. Major depressive disorder, single episode, severe with psychotic features ([place])  F32.3 traZODone (DESYREL) 50 MG tablet    QUEtiapine (SEROQUEL) 200 MG tablet    PARoxetine (PAXIL) 40 MG tablet  2. Posttraumatic stress disorder  F43.10 prazosin (MINIPRESS) 1 MG capsule  3. Generalized  anxiety disorder  F41.1 QUEtiapine (SEROQUEL) 200 MG tablet    PARoxetine (PAXIL) 40 MG tablet    LORazepam (ATIVAN) 0.5 MG tablet    hydrOXYzine (ATARAX/VISTARIL) 10 MG tablet    Past Psychiatric History: anxiety, depression, and PTSD.  Past Medical History:  Past Medical History:  Diagnosis Date  . Anemia   . Asthma   . Depression   . Dizziness 07/05/2018   Formatting of this note might be different from the original. Onset December  2019.  MRI of the brain okay.  Last Assessment & Plan:  Formatting of this note might be different from the original. Complex disease history.  Onset back in December.  Since then she has experienced syncopal episodes on at least 2 occasions.  Frequent spinning episodes and almost constant unsteadiness that is improved wi  . DVT (deep venous thrombosis) (HCC)    lower leg while traveling went to lung  . GAD (generalized anxiety disorder) 08/06/2017  . GERD (gastroesophageal reflux disease)   . Headache    Migraines  . 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 Anticoagulation x 6 months; no recurrence.  Dx 2010 Formatting of this note might be different from the original. Overview:  Anticoagulation x 6 months; no recurrence.  Dx 2010  . History of fusion of cervical spine 12/21/2016   Formatting of this note might be different from the original. 2011-2012 Formatting of this note might be different from the original. Overview:  2011-2012  . History of kidney stones   . Left ureteral calculus 11/18/2017  . Major depressive disorder, recurrent episode, moderate ([place]) 12/28/2019  . Mild intermittent asthma without complication 3/32/9518  . Mixed hyperlipidemia 06/29/2017   Formatting of this note might be different from the original. 10 year CVD risk 5%  . Nausea and vomiting 06/25/2018  . Obesity (BMI 30-39.9) 12/21/2016  . Post-traumatic stress disorder, unspecified 12/21/2018  . Restless leg syndrome 12/21/2016  . Thrombophlebitis arm 07/06/2018   Formatting of this note might be different from the original. 06/2018: Noted on [place] RUE  . Vaginal Pap smear, abnormal   . Vitamin D insufficiency     Past Surgical History:  Procedure Laterality Date  . CHOLECYSTECTOMY    . COLONOSCOPY    . COLPOSCOPY    . HYSTEROSCOPY WITH D & C N/A 04/02/2017   Procedure: DILATATION AND CURETTAGE /HYSTEROSCOPY;   Surgeon: Linda Hedges, DO;  Location: [facility];  Service: Gynecology;  Laterality: N/A;  . KIDNEY STONE SURGERY    . OVARY SURGERY    . POSTERIOR FUSION CERVICAL SPINE    . TONSILLECTOMY      Family Psychiatric History: Unknown  Family History:  Family History  Problem Relation Age of Onset  . Diabetes Father   . Heart disease Father   . Kidney disease Father   . Diabetes Mother     Social History:  Social History   Socioeconomic History  . Marital status: Divorced    Spouse name: Not on file  . Number of children: Not on file  . Years of education: Not on file  . Highest education level: Not on file  Occupational History  . Not on file  Tobacco Use  . Smoking status: Never Smoker  . Smokeless tobacco: Never Used  Vaping Use  . Vaping Use: Never used  Substance and Sexual Activity  . Alcohol use: Not  Currently    Comment: social  . Drug use: No  . Sexual activity: Yes    Birth control/protection: None  Other Topics Concern  . Not on file  Social History Narrative  . Not on file   Social Determinants of Health   Financial Resource Strain:   . Difficulty of Paying Living Expenses: Not on file  Food Insecurity:   . Worried About Charity fundraiser in the Last Year: Not on file  . Ran Out of Food in the Last Year: Not on file  Transportation Needs:   . Lack of Transportation (Medical): Not on file  . Lack of Transportation (Non-Medical): Not on file  Physical Activity:   . Days of Exercise per Week: Not on file  . Minutes of Exercise per Session: Not on file  Stress:   . Feeling of Stress : Not on file  Social Connections:   . Frequency of Communication with Friends and Family: Not on file  . Frequency of Social Gatherings with Friends and Family: Not on file  . Attends Religious Services: Not on file  . Active Member of Clubs or Organizations: Not on file  . Attends Archivist Meetings: Not on file  . Marital Status: Not on file     Allergies:  Allergies  Allergen Reactions  . Codeine Nausea And Vomiting, Other (See Comments) and Anaphylaxis    nausea   . Naproxen Swelling, Shortness Of Breath and Rash  . Avocado Other (See Comments)    Stomach pain, cause tongue and throat swelling Stomach pain, cause tongue and throat swelling  . Sulfa Antibiotics Other (See Comments)    Gets boils  . Fluconazole     Swelling in mucosa   . Sulfamethoxazole Other (See Comments)    Boils   . Sulfasalazine Other (See Comments)    Gets boils  . Nickel Rash  . Penicillins Other (See Comments) and Rash     Has patient had a PCN reaction causing immediate rash, facial/tongue/throat swelling, SOB or lightheadedness with hypotension: No Has patient had a PCN reaction causing severe rash involving mucus membranes or skin necrosis: No Has patient had a PCN reaction that required hospitalization No Has patient had a PCN reaction occurring within the last 10 years: No If all of the above answers are "NO", then may proceed with Cephalosporin use.   Has patient had a PCN reaction causing immediate rash, facial/tongue/throat swelling, SOB or lightheadedness with hypotension: No Has patient had a PCN reaction causing severe rash involving mucus membranes or skin necrosis: No Has patient had a PCN reaction that required hospitalization No Has patient had a PCN reaction occurring within the last 10 years: No If all of the above answers are "NO", then may proceed with Cephalosporin use. rash   . Zofran [Ondansetron] Itching    Metabolic Disorder Labs: Lab Results  Component Value Date   HGBA1C 6.0 (H) 04/05/2020   No results found for: PROLACTIN No results found for: CHOL, TRIG, HDL, CHOLHDL, VLDL, LDLCALC Lab Results  Component Value Date   TSH 2.090 03/06/2020    Therapeutic Level Labs: No results found for: LITHIUM No results found for: VALPROATE No components found for:  CBMZ  Current Medications: Current  Outpatient Medications  Medication Sig Dispense Refill  . albuterol (VENTOLIN HFA) 108 (90 Base) MCG/ACT inhaler Inhale 2 puffs into the lungs every 6 (six) hours as needed for wheezing or shortness of breath. 18 g 2  . cyclobenzaprine (FLEXERIL)  10 MG tablet Take 1 tablet (10 mg total) by mouth 2 (two) times daily as needed for muscle spasms. 14 tablet 0  . famotidine (PEPCID) 20 MG tablet Take 1 tablet (20 mg total) by mouth 2 (two) times daily. 60 tablet 2  . gabapentin (NEURONTIN) 300 MG capsule Take 1 capsule (300 mg total) by mouth 3 (three) times daily. 90 capsule 2  . hydrOXYzine (ATARAX/VISTARIL) 10 MG tablet Take 1 tablet (10 mg total) by mouth 3 (three) times daily as needed. 90 tablet 2  . ibuprofen (ADVIL) 600 MG tablet Take 1 tablet (600 mg total) by mouth every 8 (eight) hours as needed. For pain 60 tablet 0  . ipratropium-albuterol (DUONEB) 0.5-2.5 (3) MG/3ML SOLN 1 nebule every 4 to 6 hrs prn wheezing 360 mL 2  . LORazepam (ATIVAN) 0.5 MG tablet Take 1 tablet (0.5 mg total) by mouth in the morning, at noon, and at bedtime. 90 tablet 2  . meclizine (ANTIVERT) 25 MG tablet Take 1 tablet (25 mg total) by mouth 3 (three) times daily as needed for dizziness. 30 tablet 2  . methocarbamol (ROBAXIN) 500 MG tablet Take 1 tablet (500 mg total) by mouth every 8 (eight) hours as needed for muscle spasms. 60 tablet 0  . Multiple Vitamin (MULTIVITAMIN WITH MINERALS) TABS tablet Take 1 tablet by mouth daily.    Marland Kitchen omeprazole (PRILOSEC) 40 MG capsule Take 1 capsule (40 mg total) by mouth 2 (two) times daily. 60 capsule 2  . PARoxetine (PAXIL) 40 MG tablet Take 1 tablet (40 mg total) by mouth daily. 30 tablet 2  . prazosin (MINIPRESS) 1 MG capsule Take 1 capsule (1 mg total) by mouth at bedtime. 30 capsule 3  . promethazine (PHENERGAN) 12.5 MG tablet Take 1 tablet (12.5 mg total) by mouth every 8 (eight) hours as needed for nausea or vomiting. 20 tablet 0  . QUEtiapine (SEROQUEL) 200 MG tablet Take 1  tablet (200 mg total) by mouth at bedtime. 30 tablet 2  . traZODone (DESYREL) 50 MG tablet Take 1 tablet (50 mg total) by mouth at bedtime as needed for sleep. 30 tablet 2   No current facility-administered medications for this visit.     Musculoskeletal: Strength & Muscle Tone: Unable to assess due to telehalth visit [place]: Unable to assess due to telehalth visit Patient leans: N/A  Psychiatric Specialty Exam: Review of Systems  There were no vitals taken for this visit.There is no height or weight on file to calculate BMI.  General Appearance: Well Groomed  Eye Contact:  Good  Speech:  Clear and Coherent and Normal Rate  Volume:  Decreased  Mood:  Anxious and Depressed  Affect:  Congruent  Thought Process:  Coherent, Goal Directed and Linear  Orientation:  Full (Time, Place, and Person)  Thought Content: WDL and Logical   Suicidal Thoughts:  Yes.  without intent/plan  Homicidal Thoughts:  Yes.  without intent/plan  Memory:  Immediate;   Good Recent;   Good Remote;   Good  Judgement:  Good  Insight:  Fair  Psychomotor Activity:  Normal  Concentration:  Concentration: Good and Attention Span: Good  Recall:  Good  Fund of Knowledge: Good  Language: Good  Akathisia:  No  Handed:  Right  AIMS (if indicated): Not done  Assets:  Communication Skills Desire for Improvement Housing Intimacy Social Support  ADL's:  Intact  Cognition: WNL  Sleep:  Good   Screenings: GAD-7     Video Visit  from 06/18/2020 in [facility] Visit from 04/05/2020 in [place] Office Visit from 03/06/2020 in [place] [facility] from 12/27/2019 in [facility] Office Visit from 08/09/2019 in [place]  Total GAD-7 Score 18 20 18 20 20     PHQ2-9     Video Visit from 06/18/2020 in [facility] Office Visit from  04/05/2020 in [place] Office Visit from 03/06/2020 in [place] [facility] from 12/27/2019 in [facility] Office Visit from 08/09/2019 in [place]  PHQ-2 Total Score 6 6 2 4 6   PHQ-9 Total Score 21 24 13 18 26        Assessment and Plan: Patient endorses anxiety, depression, and VAH.  She is agreeable to increasing Paxil 30 mg to 40 mg to help manage symptoms of anxiety and depression.  She is also agreeable to starting hydroxyzine 10 mg 3 times daily as needed for anxiety.  She will continue all other medications as prescribed.  1. Posttraumatic stress disorder  Continue- prazosin (MINIPRESS) 1 MG capsule; Take 1 capsule (1 mg total) by mouth at bedtime.  Dispense: 30 capsule; Refill: 3  2. Generalized anxiety disorder  Continue- QUEtiapine (SEROQUEL) 200 MG tablet; Take 1 tablet (200 mg total) by mouth at bedtime.  Dispense: 30 tablet; Refill: 2 Increased- PARoxetine (PAXIL) 40 MG tablet; Take 1 tablet (40 mg total) by mouth daily.  Dispense: 30 tablet; Refill: 2 Continue- LORazepam (ATIVAN) 0.5 MG tablet; Take 1 tablet (0.5 mg total) by mouth in the morning, at noon, and at bedtime.  Dispense: 90 tablet; Refill: 2 Start- hydrOXYzine (ATARAX/VISTARIL) 10 MG tablet; Take 1 tablet (10 mg total) by mouth 3 (three) times daily as needed.  Dispense: 90 tablet; Refill: 2  3. Major depressive disorder, single episode, severe with psychotic features ([place])  Continue- traZODone (DESYREL) 50 MG tablet; Take 1 tablet (50 mg total) by mouth at bedtime as needed for sleep.  Dispense: 30 tablet; Refill: 2 Continue- QUEtiapine (SEROQUEL) 200 MG tablet; Take 1 tablet (200 mg total) by mouth at bedtime.  Dispense: 30 tablet; Refill: 2 Increased- PARoxetine (PAXIL) 40 MG tablet; Take 1 tablet (40 mg total) by mouth daily.  Dispense: 30 tablet; Refill: 2   Follow up in 2 months   Follow-up with therapy Salley Slaughter, NP 06/18/2020, 9:51 AM

## 2020-06-25 ENCOUNTER — Encounter: Payer: Self-pay | Admitting: Family Medicine

## 2020-07-08 ENCOUNTER — Telehealth: Payer: Self-pay | Admitting: Cardiology

## 2020-07-08 NOTE — Telephone Encounter (Signed)
° ° °  Pt would like to know if DR. Revankar  Can get her a test to determine if she is allergic to any metal

## 2020-07-18 ENCOUNTER — Ambulatory Visit (HOSPITAL_COMMUNITY): Payer: No Payment, Other | Admitting: Clinical

## 2020-07-18 ENCOUNTER — Other Ambulatory Visit: Payer: Self-pay

## 2020-07-29 ENCOUNTER — Telehealth (INDEPENDENT_AMBULATORY_CARE_PROVIDER_SITE_OTHER): Payer: No Payment, Other | Admitting: Psychiatry

## 2020-07-29 ENCOUNTER — Encounter (HOSPITAL_COMMUNITY): Payer: Self-pay | Admitting: Psychiatry

## 2020-07-29 ENCOUNTER — Other Ambulatory Visit: Payer: Self-pay

## 2020-07-29 DIAGNOSIS — F4310 Post-traumatic stress disorder, unspecified: Secondary | ICD-10-CM

## 2020-07-29 DIAGNOSIS — F411 Generalized anxiety disorder: Secondary | ICD-10-CM

## 2020-07-29 DIAGNOSIS — F323 Major depressive disorder, single episode, severe with psychotic features: Secondary | ICD-10-CM

## 2020-07-29 MED ORDER — QUETIAPINE FUMARATE 200 MG PO TABS: 200.0000 mg | ORAL_TABLET | Freq: Every day | ORAL | 2 refills | Status: DC

## 2020-07-29 MED ORDER — TRAZODONE HCL 150 MG PO TABS: 150.0000 mg | ORAL_TABLET | Freq: Every day | ORAL | 2 refills | Status: DC

## 2020-07-29 MED ORDER — PAROXETINE HCL 40 MG PO TABS: 40.0000 mg | ORAL_TABLET | Freq: Every day | ORAL | 2 refills | Status: DC

## 2020-07-29 MED ORDER — PRAZOSIN HCL 1 MG PO CAPS: 1.0000 mg | ORAL_CAPSULE | Freq: Every day | ORAL | 3 refills | Status: DC

## 2020-07-29 MED ORDER — HYDROXYZINE HCL 25 MG PO TABS: 25.0000 mg | ORAL_TABLET | Freq: Three times a day (TID) | ORAL | 2 refills | Status: DC | PRN

## 2020-07-29 MED ORDER — TRAZODONE HCL 150 MG PO TABS
150.0000 mg | ORAL_TABLET | Freq: Every evening | ORAL | 2 refills | Status: DC | PRN
Start: 2020-07-29 — End: 2020-07-29

## 2020-07-29 MED ORDER — LORAZEPAM 0.5 MG PO TABS: 0.5000 mg | ORAL_TABLET | Freq: Three times a day (TID) | ORAL | 2 refills | Status: DC

## 2020-07-29 NOTE — Addendum Note (Signed)
Addended by: Salley Slaughter on: 07/29/2020 04:46 PM   Modules accepted: Level of Service

## 2020-07-29 NOTE — Progress Notes (Signed)
[facility] MD/PA/NP OP Progress Note Virtual Visit via Telephone Note  I connected with Casey Jordan on 07/29/20 at  3:30 PM EST by telephone and verified that I am speaking with the correct person using two identifiers.  Location: Patient: home Provider: Clinic   I discussed the limitations, risks, security and privacy concerns of performing an evaluation and management service by telephone and the availability of in person appointments. I also discussed with the patient that there may be a patient responsible charge related to this service. The patient expressed understanding and agreed to proceed.   I provided 30 minutes of non-face-to-face time during this encounter.       07/29/2020 4:42 PM Casey Jordan  MRN:  [id number]  Chief Complaint: "I a so depressed"  HPI: 55  year old female seen today for follow up psychiatric evaluation. She has a psychiatric history of anxiety, depression, and PTSD. She is currently managed on Ativan 0.5 mg three times daily, prazosin 1 mg nightly, hydroxyzine 10 mg 3 times daily as needed, trazodone 50 mg nightly Paxil 40 mg daily, and Seroquel 200 mg nightly. She noted that her medications are somewhat effective in managing her psychiatric conditions.  Today she is unable to login virtually so her exam was done over the phone. On exam she is pleasant, cooperative, and engaged in conversation.  She describes her mood as anxious and depressed.  She notes that since her last visit she has gone through a lot.  She notes that her sister and her brother-in-law has passed away within 10 days of each other.  She also notes that he found out that both of her parents have kidney disease.  Provider conducted a GAD-7 and patient scored a 17, at last visit she scored an 18.  Provider also conducted the [place] 9 and patient scored a 24, at last visit she scored a 97.  She endorses passive SI however notes she does not want to hurt herself today.  She denies SI/HI/VH.   Patient notes that at times she is paranoid and has AH.  She notes that she feels that she hears people in her home and notes that she feels like they are breaking in.  She informed provider that she has poor sleep noting that she sleeps 2 to 3 hours a night.  She also endorses having a poor appetite.    Patient notes that in the winter months she becomes more depressed.  Today she is agreeable to increasing trazodone 50mg  to 150 mg nightly to help manage sleep and depression.  She is also agreeable to increasing hydroxyzine 10mg  to 25 mg to help manage anxiety.  Patient informed provider that she believes her paranoia is due to her lack of sleep.  At this time Seroquel not readjusted.  Seroquel may be adjusted at next visit.  Patient also notes that she is unsure if Paxil is working.  Provider recommended continuing Paxil for now and reassessing at next visit.  She was agreeable to this recommendation.  She will continue all other medications as prescribed.  She will also follow-up with outpatient counseling therapy.  No other concerns at this time.     Visit Diagnosis:    ICD-10-CM   1. Generalized anxiety disorder  F41.1 hydrOXYzine (ATARAX/VISTARIL) 25 MG tablet    LORazepam (ATIVAN) 0.5 MG tablet    PARoxetine (PAXIL) 40 MG tablet    QUEtiapine (SEROQUEL) 200 MG tablet  2. Major depressive disorder, single episode, severe with psychotic  features (HCC)  F32.3 PARoxetine (PAXIL) 40 MG tablet    QUEtiapine (SEROQUEL) 200 MG tablet    traZODone (DESYREL) 150 MG tablet    DISCONTINUED: traZODone (DESYREL) 150 MG tablet  3. Posttraumatic stress disorder  F43.10 prazosin (MINIPRESS) 1 MG capsule    Past Psychiatric History: anxiety, depression, and PTSD.  Past Medical History:  Past Medical History:  Diagnosis Date  . Anemia   . Asthma   . Depression   . Dizziness 07/05/2018   Formatting of this note might be different from the original. Onset December 2019.  MRI of the brain okay.  Last  Assessment & Plan:  Formatting of this note might be different from the original. Complex disease history.  Onset back in December.  Since then she has experienced syncopal episodes on at least 2 occasions.  Frequent spinning episodes and almost constant unsteadiness that is improved wi  . DVT (deep venous thrombosis) (HCC)    lower leg while traveling went to lung  . GAD (generalized anxiety disorder) 08/06/2017  . GERD (gastroesophageal reflux disease)   . Headache    Migraines  . 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 Anticoagulation x 6 months; no recurrence.  Dx 2010 Formatting of this note might be different from the original. Overview:  Anticoagulation x 6 months; no recurrence.  Dx 2010  . History of fusion of cervical spine 12/21/2016   Formatting of this note might be different from the original. 2011-2012 Formatting of this note might be different from the original. Overview:  2011-2012  . History of kidney stones   . Left ureteral calculus 11/18/2017  . Major depressive disorder, recurrent episode, moderate ([place]) 12/28/2019  . Mild intermittent asthma without complication 9/93/7169  . Mixed hyperlipidemia 06/29/2017   Formatting of this note might be different from the original. 10 year CVD risk 5%  . Nausea and vomiting 06/25/2018  . Obesity (BMI 30-39.9) 12/21/2016  . Post-traumatic stress disorder, unspecified 12/21/2018  . Restless leg syndrome 12/21/2016  . Thrombophlebitis arm 07/06/2018   Formatting of this note might be different from the original. 06/2018: Noted on [place] RUE  . Vaginal Pap smear, abnormal   . Vitamin D insufficiency     Past Surgical History:  Procedure Laterality Date  . CHOLECYSTECTOMY    . COLONOSCOPY    . COLPOSCOPY    . HYSTEROSCOPY WITH D & C N/A 04/02/2017   Procedure: DILATATION AND CURETTAGE /HYSTEROSCOPY;  Surgeon: Linda Hedges, DO;   Location: [facility];  Service: Gynecology;  Laterality: N/A;  . KIDNEY STONE SURGERY    . OVARY SURGERY    . POSTERIOR FUSION CERVICAL SPINE    . TONSILLECTOMY      Family Psychiatric History: Unknown  Family History:  Family History  Problem Relation Age of Onset  . Diabetes Father   . Heart disease Father   . Kidney disease Father   . Diabetes Mother     Social History:  Social History   Socioeconomic History  . Marital status: Divorced    Spouse name: Not on file  . Number of children: Not on file  . Years of education: Not on file  . Highest education level: Not on file  Occupational History  . Not on file  Tobacco Use  . Smoking status: Never Smoker  . Smokeless tobacco: Never Used  Vaping Use  . Vaping  Use: Never used  Substance and Sexual Activity  . Alcohol use: Not Currently    Comment: social  . Drug use: No  . Sexual activity: Yes    Birth control/protection: None  Other Topics Concern  . Not on file  Social History Narrative  . Not on file   Social Determinants of Health   Financial Resource Strain: Not on file  Food Insecurity: Not on file  Transportation Needs: Not on file  Physical Activity: Not on file  Stress: Not on file  Social Connections: Not on file    Allergies:  Allergies  Allergen Reactions  . Codeine Nausea And Vomiting, Other (See Comments) and Anaphylaxis    nausea   . Naproxen Swelling, Shortness Of Breath and Rash  . Avocado Other (See Comments)    Stomach pain, cause tongue and throat swelling Stomach pain, cause tongue and throat swelling  . Sulfa Antibiotics Other (See Comments)    Gets boils  . Fluconazole     Swelling in mucosa   . Sulfamethoxazole Other (See Comments)    Boils   . Sulfasalazine Other (See Comments)    Gets boils  . Nickel Rash  . Penicillins Other (See Comments) and Rash     Has patient had a PCN reaction causing immediate rash, facial/tongue/throat swelling, SOB or lightheadedness with  hypotension: No Has patient had a PCN reaction causing severe rash involving mucus membranes or skin necrosis: No Has patient had a PCN reaction that required hospitalization No Has patient had a PCN reaction occurring within the last 10 years: No If all of the above answers are "NO", then may proceed with Cephalosporin use.   Has patient had a PCN reaction causing immediate rash, facial/tongue/throat swelling, SOB or lightheadedness with hypotension: No Has patient had a PCN reaction causing severe rash involving mucus membranes or skin necrosis: No Has patient had a PCN reaction that required hospitalization No Has patient had a PCN reaction occurring within the last 10 years: No If all of the above answers are "NO", then may proceed with Cephalosporin use. rash   . Zofran [Ondansetron] Itching    Metabolic Disorder Labs: Lab Results  Component Value Date   HGBA1C 6.0 (H) 04/05/2020   No results found for: PROLACTIN No results found for: CHOL, TRIG, HDL, CHOLHDL, VLDL, LDLCALC Lab Results  Component Value Date   TSH 2.090 03/06/2020    Therapeutic Level Labs: No results found for: LITHIUM No results found for: VALPROATE No components found for:  CBMZ  Current Medications: Current Outpatient Medications  Medication Sig Dispense Refill  . albuterol (VENTOLIN HFA) 108 (90 Base) MCG/ACT inhaler Inhale 2 puffs into the lungs every 6 (six) hours as needed for wheezing or shortness of breath. 18 g 2  . cyclobenzaprine (FLEXERIL) 10 MG tablet Take 1 tablet (10 mg total) by mouth 2 (two) times daily as needed for muscle spasms. 14 tablet 0  . famotidine (PEPCID) 20 MG tablet Take 1 tablet (20 mg total) by mouth 2 (two) times daily. 60 tablet 2  . gabapentin (NEURONTIN) 300 MG capsule Take 1 capsule (300 mg total) by mouth 3 (three) times daily. 90 capsule 2  . hydrOXYzine (ATARAX/VISTARIL) 25 MG tablet Take 1 tablet (25 mg total) by mouth 3 (three) times daily as needed. 90 tablet 2   . ipratropium-albuterol (DUONEB) 0.5-2.5 (3) MG/3ML SOLN 1 nebule every 4 to 6 hrs prn wheezing 360 mL 2  . LORazepam (ATIVAN) 0.5 MG tablet Take 1 tablet (  0.5 mg total) by mouth in the morning, at noon, and at bedtime. 90 tablet 2  . meclizine (ANTIVERT) 25 MG tablet Take 1 tablet (25 mg total) by mouth 3 (three) times daily as needed for dizziness. 30 tablet 2  . methocarbamol (ROBAXIN) 500 MG tablet Take 1 tablet (500 mg total) by mouth every 8 (eight) hours as needed for muscle spasms. 60 tablet 0  . Multiple Vitamin (MULTIVITAMIN WITH MINERALS) TABS tablet Take 1 tablet by mouth daily.    Marland Kitchen omeprazole (PRILOSEC) 40 MG capsule Take 1 capsule (40 mg total) by mouth 2 (two) times daily. 60 capsule 2  . PARoxetine (PAXIL) 40 MG tablet Take 1 tablet (40 mg total) by mouth daily. 30 tablet 2  . prazosin (MINIPRESS) 1 MG capsule Take 1 capsule (1 mg total) by mouth at bedtime. 30 capsule 3  . promethazine (PHENERGAN) 12.5 MG tablet Take 1 tablet (12.5 mg total) by mouth every 8 (eight) hours as needed for nausea or vomiting. 20 tablet 0  . QUEtiapine (SEROQUEL) 200 MG tablet Take 1 tablet (200 mg total) by mouth at bedtime. 30 tablet 2  . traZODone (DESYREL) 150 MG tablet Take 1 tablet (150 mg total) by mouth at bedtime. 30 tablet 2   No current facility-administered medications for this visit.     Musculoskeletal: Strength & Muscle Tone: Unable to assess due to telehalth visit [place]: Unable to assess due to telehalth visit Patient leans: N/A  Psychiatric Specialty Exam: Review of Systems  There were no vitals taken for this visit.There is no height or weight on file to calculate BMI.  General Appearance: Well Groomed  Eye Contact:  Good  Speech:  Clear and Coherent and Normal Rate  Volume:  Decreased  Mood:  Anxious and Depressed  Affect:  Congruent  Thought Process:  Coherent, Goal Directed and Linear  Orientation:  Full (Time, Place, and Person)  Thought Content: Logical,  Hallucinations: Auditory and Paranoid Ideation   Suicidal Thoughts:  Yes.  without intent/plan  Homicidal Thoughts:  No  Memory:  Immediate;   Good Recent;   Good Remote;   Good  Judgement:  Good  Insight:  Fair  Psychomotor Activity:  Normal  Concentration:  Concentration: Good and Attention Span: Good  Recall:  Good  Fund of Knowledge: Good  Language: Good  Akathisia:  No  Handed:  Right  AIMS (if indicated): Not done  Assets:  Communication Skills Desire for Improvement Housing Intimacy Social Support  ADL's:  Intact  Cognition: WNL  Sleep:  Poor   Screenings: GAD-7   Flowsheet Row Video Visit from 07/29/2020 in [facility] Video Visit from 06/18/2020 in [facility] Office Visit from 04/05/2020 in [facility] Office Visit from 03/06/2020 in [facility] from 12/27/2019 in [facility]  Total GAD-7 Score 17 18 20 18 20     PHQ2-9   Flowsheet Row Video Visit from 07/29/2020 in [facility] Video Visit from 06/18/2020 in [facility] Office Visit from 04/05/2020 in [place] Office Visit from 03/06/2020 in [facility] from 12/27/2019 in [facility]  PHQ-2 Total Score 6 6 6 2 4   PHQ-9 Total Score 24 21 24 13 18        Assessment and Plan: Patient endorses anxiety, depression, insomnia and AH.  She is agreeable to increasing trazodone 50mg  to 150 mg nightly to help manage sleep and depression.  She is also agreeable to increasing hydroxyzine 10mg  to 25 mg to help manage anxiety.  Patient informed provider that she believes her paranoia is due to her lack of sleep.  At this time Seroquel not readjusted.  Seroquel may be adjusted at next visit.  Patient also notes that she is unsure if  Paxil is working.  Provider recommended continuing Paxil for now and reassessing at next visit.  She was agreeable to this recommendation.  1. Generalized anxiety disorder  Increased- hydrOXYzine (ATARAX/VISTARIL) 25 MG tablet; Take 1 tablet (25 mg total) by mouth 3 (three) times daily as needed.  Dispense: 90 tablet; Refill: 2 Continue- LORazepam (ATIVAN) 0.5 MG tablet; Take 1 tablet (0.5 mg total) by mouth in the morning, at noon, and at bedtime.  Dispense: 90 tablet; Refill: 2 Continue- PARoxetine (PAXIL) 40 MG tablet; Take 1 tablet (40 mg total) by mouth daily.  Dispense: 30 tablet; Refill: 2 Continue- QUEtiapine (SEROQUEL) 200 MG tablet; Take 1 tablet (200 mg total) by mouth at bedtime.  Dispense: 30 tablet; Refill: 2  2. Major depressive disorder, single episode, severe with psychotic features ([place])  Continue- PARoxetine (PAXIL) 40 MG tablet; Take 1 tablet (40 mg total) by mouth daily.  Dispense: 30 tablet; Refill: 2 Continue- QUEtiapine (SEROQUEL) 200 MG tablet; Take 1 tablet (200 mg total) by mouth at bedtime.  Dispense: 30 tablet; Refill: 2 Increased- traZODone (DESYREL) 150 MG tablet; Take 1 tablet (150 mg total) by mouth at bedtime.  Dispense: 30 tablet; Refill: 2  3. Posttraumatic stress disorder  Continue- prazosin (MINIPRESS) 1 MG capsule; Take 1 capsule (1 mg total) by mouth at bedtime.  Dispense: 30 capsule; Refill: 3   Follow up in 2 months  Follow-up with therapy  Salley Slaughter, NP 07/29/2020, 4:42 PM

## 2020-08-01 ENCOUNTER — Other Ambulatory Visit: Payer: Self-pay

## 2020-08-01 ENCOUNTER — Ambulatory Visit (HOSPITAL_COMMUNITY): Payer: Self-pay | Admitting: Clinical

## 2020-08-01 ENCOUNTER — Telehealth (HOSPITAL_COMMUNITY): Payer: Self-pay | Admitting: Clinical

## 2020-08-01 NOTE — Telephone Encounter (Signed)
Therapist sent the client a text message link for the scheduled virtual visit via [place]. Client did not check in. Therapist followed up with a tele-phone call to her cell phone. Client did not answer. Therapist left a voice mail instructing the client to call the office back to reschedule the appointment.

## 2020-08-08 ENCOUNTER — Telehealth: Payer: Self-pay
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
## 2020-08-08 NOTE — Telephone Encounter (Signed)
You can change the appointment to in person that will be fine, as long as she does not have any fever or positive covid in the last 5 days she can come to the office.

## 2020-08-09 NOTE — Telephone Encounter (Signed)
Called patient and left message per DPR that her appointment for 2/10 was switched to in person and that Levada Dy would be able to provide documentation for her prior to the 15th.

## 2020-08-15 ENCOUNTER — Ambulatory Visit: Payer: Self-pay | Admitting: Cardiology

## 2020-08-29 ENCOUNTER — Telehealth: Payer: Self-pay

## 2020-08-29 ENCOUNTER — Ambulatory Visit: Payer: Medicaid Other | Admitting: Physician Assistant

## 2020-08-29 NOTE — Telephone Encounter (Signed)
5 year placard approved and up front for pick up.  Thanks, Freeman Caldron, PA-C

## 2020-08-29 NOTE — Telephone Encounter (Signed)
Spoke w/pt about appt scheduled for today, pt stated that she needs a disability letter explaining her back pain issues and other orthopedic issues. Pt has an orthopedic, adv pt to f/u with Ortho as they already know her condition, she did request handicap placard that is available up front for pick up

## 2020-08-29 NOTE — Telephone Encounter (Signed)
Received CRM from patient... notified that her placard was ready for pickup. Patient stated she would pick it up tomorrow morning.

## 2020-09-03 ENCOUNTER — Ambulatory Visit: Payer: Medicaid Other | Admitting: Cardiology

## 2020-09-12 ENCOUNTER — Ambulatory Visit: Payer: Self-pay | Admitting: Cardiology

## 2020-09-17 ENCOUNTER — Ambulatory Visit (HOSPITAL_COMMUNITY): Payer: Self-pay | Admitting: Clinical

## 2020-09-17 ENCOUNTER — Other Ambulatory Visit: Payer: Self-pay

## 2020-09-19 ENCOUNTER — Encounter (HOSPITAL_COMMUNITY): Payer: Self-pay | Admitting: Psychiatry

## 2020-09-19 ENCOUNTER — Telehealth (HOSPITAL_COMMUNITY): Payer: Self-pay | Admitting: *Deleted

## 2020-09-19 NOTE — Telephone Encounter (Signed)
Letter written and placed in MyChart. Patient can printed at her convenience or come to the clinic to pick it up.

## 2020-09-19 NOTE — Telephone Encounter (Signed)
Notified Casey Jordan letter was ready for her to pick up or take off from My Chart. She said she would be in tomorrow to pick the letter up.

## 2020-09-19 NOTE — Telephone Encounter (Signed)
Called asking to get information to Dr Ronne Binning. Writer agreed to pass her concerns to her provider. She needs a new letter written to be included in her disability case that include any new or changed medicine as well as a reference in the letter of her most recent visit here as the first letter Dr Ronne Binning wrote for her she had last seen her in Sept.Will bring this request to Dr Ronne Binning and notify patient when she can pick the letter up.

## 2020-09-20 NOTE — Telephone Encounter (Signed)
Thank you :)

## 2020-09-26 ENCOUNTER — Ambulatory Visit (HOSPITAL_COMMUNITY): Payer: No Payment, Other | Admitting: Psychiatry

## 2020-10-01 ENCOUNTER — Other Ambulatory Visit: Payer: Self-pay

## 2020-10-01 DIAGNOSIS — M4302 Spondylolysis, cervical region: Secondary | ICD-10-CM | POA: Insufficient documentation

## 2020-10-01 HISTORY — DX: Spondylolysis, cervical region: M43.02

## 2020-10-03 ENCOUNTER — Ambulatory Visit: Payer: Self-pay | Admitting: Cardiology

## 2020-10-03 ENCOUNTER — Encounter: Payer: Self-pay | Admitting: Cardiology

## 2020-10-03 ENCOUNTER — Telehealth (HOSPITAL_COMMUNITY): Payer: Self-pay | Admitting: Psychiatry

## 2020-10-03 ENCOUNTER — Other Ambulatory Visit: Payer: Self-pay

## 2020-10-03 ENCOUNTER — Ambulatory Visit (INDEPENDENT_AMBULATORY_CARE_PROVIDER_SITE_OTHER): Payer: Self-pay | Admitting: Cardiology

## 2020-10-03 VITALS — BP 140/78 | HR 95 | Ht 65.0 in | Wt 237.8 lb

## 2020-10-03 DIAGNOSIS — Z01810 Encounter for preprocedural cardiovascular examination: Secondary | ICD-10-CM

## 2020-10-03 DIAGNOSIS — E669 Obesity, unspecified: Secondary | ICD-10-CM
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
## 2020-10-03 NOTE — Patient Instructions (Signed)

## 2020-10-24 ENCOUNTER — Other Ambulatory Visit: Payer: Self-pay

## 2020-10-24 ENCOUNTER — Encounter (HOSPITAL_COMMUNITY): Payer: Self-pay | Admitting: Psychiatry

## 2020-10-24 ENCOUNTER — Telehealth (INDEPENDENT_AMBULATORY_CARE_PROVIDER_SITE_OTHER): Payer: Medicare Other | Admitting: Psychiatry

## 2020-10-24 DIAGNOSIS — F323 Major depressive disorder, single episode, severe with psychotic features: Secondary | ICD-10-CM

## 2020-10-24 DIAGNOSIS — F4310 Post-traumatic stress disorder, unspecified: Secondary | ICD-10-CM

## 2020-10-24 DIAGNOSIS — F411 Generalized anxiety disorder: Secondary | ICD-10-CM

## 2020-10-24 MED ORDER — TRAZODONE HCL 150 MG PO TABS: 150.0000 mg | ORAL_TABLET | Freq: Every day | ORAL | 2 refills | Status: DC

## 2020-10-24 MED ORDER — PRAZOSIN HCL 1 MG PO CAPS: 1.0000 mg | ORAL_CAPSULE | Freq: Every day | ORAL | 3 refills | Status: DC

## 2020-10-24 MED ORDER — LORAZEPAM 0.5 MG PO TABS
0.5000 mg | ORAL_TABLET | Freq: Three times a day (TID) | ORAL | 2 refills | Status: DC
Start: 2020-10-24 — End: 2020-12-30

## 2020-10-24 MED ORDER — PAROXETINE HCL 40 MG PO TABS: 40.0000 mg | ORAL_TABLET | Freq: Every day | ORAL | 2 refills | Status: DC

## 2020-10-24 MED ORDER — PAROXETINE HCL 10 MG PO TABS: 10.0000 mg | ORAL_TABLET | Freq: Every day | ORAL | 2 refills | Status: DC

## 2020-10-24 MED ORDER — QUETIAPINE FUMARATE 200 MG PO TABS: 200.0000 mg | ORAL_TABLET | Freq: Every day | ORAL | 2 refills | Status: DC

## 2020-10-24 MED ORDER — HYDROXYZINE HCL 50 MG PO TABS: 50.0000 mg | ORAL_TABLET | Freq: Three times a day (TID) | ORAL | 2 refills | Status: DC | PRN

## 2020-10-24 NOTE — Progress Notes (Signed)
[facility] MD/PA/NP OP Progress Note Virtual Visit via Telephone Note  I connected with Casey Jordan on 10/24/20 at  4:00 PM EDT by telephone and verified that I am speaking with the correct person using two identifiers.  Location: Patient: home Provider: Clinic   I discussed the limitations, risks, security and privacy concerns of performing an evaluation and management service by telephone and the availability of in person appointments. I also discussed with the patient that there may be a patient responsible charge related to this service. The patient expressed understanding and agreed to proceed.   I provided 30 minutes of non-face-to-face time during this encounter.       10/24/2020 2:33 PM Casey Jordan  MRN:  [id number]  Chief Complaint: "Im just waiting for things to get better"  HPI: 55  year old female seen today for follow up psychiatric evaluation. She has a psychiatric history of anxiety, depression, and PTSD. She is currently managed on Ativan 0.5 mg three times daily, prazosin 1 mg nightly, hydroxyzine 25 mg 3 times daily as needed, trazodone 150 mg nightly Paxil 40 mg daily, and Seroquel 200 mg nightly. She noted that her medications are somewhat effective in managing her psychiatric conditions.  Today she is unable to login virtually so her exam was done over the phone. On exam she is pleasant, cooperative, and engaged in conversation.  She informed provider that she continues to be in pain.  She notes that her neck and back constantly hurts.  She reports that tramadol, heating pads, and over-the-counter muscle relaxants have been ineffective.  She notes that recently she found out that her surgeon will not do her operation because she has Medicare.  She informed provider that this exacerbates her anxiety depression as she worries that things will not get better.  She states that she is she is waiting for things to improve.  Today provider conducted a GAD-7 and patient scored  an 18, at her last visit she scored an 13.  Provider also conducted a PHQ-9 and patient scored a 21, at her last visit she scored a 24.  She informed provider that her appetite has increased, noting that she eats a lot of sweets to comfort herself.  She also reports that she has gained 10 pounds.  Patient notes that she sleeps 4 to 5 hours nightly.  He endorses passive SI however notes that she does not want to harm herself because she has to live for her family.  Today she denies SI/HI/VAH, mania, or paranoia.  Patient notes that recently her son moved in to her home and notes that her home is now crowded.  She informed Probation officer that she feels like a burden to her family especially her parents.  She reports that she is adjusting to these new changes.  She also informed provider that she received her disability and applied for food stamps yesterday and is hopeful that things will improve.  Today she is agreeable to increasing Paxil 40 mg to 50 mg to help manage anxiety and depression.  She is also agreeable to increasing hydroxyzine 25 mg 3 times daily to 50 mg 3 times daily as needed to help manage anxiety.  Provider discussed starting Cymbalta and discontinuing Paxil if depression anxiety does not improve by her next visit (her anxiety and depression are exacerbated by her physical health and Cymbalta may reduce pain).  Provider also notes that Seroquel may be adjusted at the next visit if her anxiety and depression has  not improved.  She endorsed understanding and agreed.  She will continue all other medications as prescribed.  She will also follow-up with outpatient counseling therapy.  No other concerns at this time.   Visit Diagnosis:    ICD-10-CM   1. Generalized anxiety disorder  F41.1 hydrOXYzine (ATARAX/VISTARIL) 50 MG tablet    LORazepam (ATIVAN) 0.5 MG tablet    PARoxetine (PAXIL) 40 MG tablet    QUEtiapine (SEROQUEL) 200 MG tablet  2. Major depressive disorder, single episode, severe with  psychotic features ([place])  F32.3 PARoxetine (PAXIL) 40 MG tablet    QUEtiapine (SEROQUEL) 200 MG tablet    traZODone (DESYREL) 150 MG tablet  3. Posttraumatic stress disorder  F43.10 prazosin (MINIPRESS) 1 MG capsule    Past Psychiatric History: anxiety, depression, and PTSD.  Past Medical History:  Past Medical History:  Diagnosis Date  . Anemia   . Asthma   . Cardiac murmur 04/30/2020  . Depression   . Dizziness 07/05/2018   Formatting of this note might be different from the original. Onset December 2019.  MRI of the brain okay.  Last Assessment & Plan:  Formatting of this note might be different from the original. Complex disease history.  Onset back in December.  Since then she has experienced syncopal episodes on at least 2 occasions.  Frequent spinning episodes and almost constant unsteadiness that is improved wi  . DVT (deep venous thrombosis) (HCC)    lower leg while traveling went to lung  . GAD (generalized anxiety disorder) 08/06/2017  . Generalized anxiety disorder 08/06/2017  . GERD (gastroesophageal reflux disease)   . Headache    Migraines  . 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 Anticoagulation x 6 months; no recurrence.  Dx 2010 Formatting of this note might be different from the original. Overview:  Anticoagulation x 6 months; no recurrence.  Dx 2010  . History of fusion of cervical spine 12/21/2016   Formatting of this note might be different from the original. 2011-2012 Formatting of this note might be different from the original. Overview:  2011-2012  . History of kidney stones   . Left ureteral calculus 11/18/2017  . Major depressive disorder, recurrent episode, moderate ([place]) 12/28/2019  . Major depressive disorder, single episode, severe with psychotic features ([place]) 06/18/2020  . Mild intermittent asthma without complication 7/67/2094  . Mixed  hyperlipidemia 06/29/2017   Formatting of this note might be different from the original. 10 year CVD risk 5%  . Morbid obesity ([place]) 04/30/2020  . Nausea and vomiting 06/25/2018  . Obesity (BMI 30-39.9) 12/21/2016  . Post-traumatic stress disorder, unspecified 12/21/2018  . Posttraumatic stress disorder 06/18/2020  . Preop cardiovascular exam 04/30/2020  . Restless leg syndrome 12/21/2016  . 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 Thrombophlebitis arm 07/06/2018   Formatting of this note might be different from the original. 06/2018: Noted on [place] RUE  . Vaginal Pap smear, abnormal   . Vitamin D insufficiency     Past Surgical History:  Procedure Laterality Date  . CHOLECYSTECTOMY    . COLONOSCOPY    . COLPOSCOPY    . HYSTEROSCOPY WITH D & C N/A 04/02/2017   Procedure: DILATATION AND CURETTAGE /HYSTEROSCOPY;  Surgeon: Linda Hedges, DO;  Location: [facility];  Service: Gynecology;  Laterality: N/A;  . KIDNEY STONE SURGERY    . OVARY SURGERY    . POSTERIOR FUSION  CERVICAL SPINE    . TONSILLECTOMY      Family Psychiatric History: Unknown  Family History:  Family History  Problem Relation Age of Onset  . Diabetes Father   . Heart disease Father   . Kidney disease Father   . Diabetes Mother     Social History:  Social History   Socioeconomic History  . Marital status: Divorced    Spouse name: Not on file  . Number of children: Not on file  . Years of education: Not on file  . Highest education level: Not on file  Occupational History  . Not on file  Tobacco Use  . Smoking status: Never Smoker  . Smokeless tobacco: Never Used  Vaping Use  . Vaping Use: Never used  Substance and Sexual Activity  . Alcohol use: Not Currently    Comment: social  . Drug use: No  . Sexual activity: Yes    Birth control/protection: None  Other Topics Concern  . Not on file  Social History Narrative  . Not on file   Social Determinants of Health   Financial Resource Strain: Not on file   Food Insecurity: Not on file  Transportation Needs: Not on file  Physical Activity: Not on file  Stress: Not on file  Social Connections: Not on file    Allergies:  Allergies  Allergen Reactions  . Codeine Nausea And Vomiting, Other (See Comments) and Anaphylaxis    nausea  Other reaction(s): Unknown  . Naproxen Swelling, Shortness Of Breath and Rash  . Avocado Other (See Comments)    Stomach pain, cause tongue and throat swelling Stomach pain, cause tongue and throat swelling  . Sulfa Antibiotics Other (See Comments)    Gets boils  . Sulfamethoxazole Other (See Comments)    Boils   . Sulfasalazine Other (See Comments)    Gets boils  . Fluconazole Palpitations    Swelling in mucosa  Swelling in mucosa  . Nickel Rash  . Ondansetron Itching    Other reaction(s): Unknown  . Penicillins Other (See Comments) and Rash     Has patient had a PCN reaction causing immediate rash, facial/tongue/throat swelling, SOB or lightheadedness with hypotension: No Has patient had a PCN reaction causing severe rash involving mucus membranes or skin necrosis: No Has patient had a PCN reaction that required hospitalization No Has patient had a PCN reaction occurring within the last 10 years: No If all of the above answers are "NO", then may proceed with Cephalosporin use.   Has patient had a PCN reaction causing immediate rash, facial/tongue/throat swelling, SOB or lightheadedness with hypotension: No Has patient had a PCN reaction causing severe rash involving mucus membranes or skin necrosis: No Has patient had a PCN reaction that required hospitalization No Has patient had a PCN reaction occurring within the last 10 years: No If all of the above answers are "NO", then may proceed with Cephalosporin use. rash     Metabolic Disorder Labs: Lab Results  Component Value Date   HGBA1C 6.0 (H) 04/05/2020   No results found for: PROLACTIN No results found for: CHOL, TRIG, HDL, CHOLHDL,  VLDL, LDLCALC Lab Results  Component Value Date   TSH 2.090 03/06/2020    Therapeutic Level Labs: No results found for: LITHIUM No results found for: VALPROATE No components found for:  CBMZ  Current Medications: Current Outpatient Medications  Medication Sig Dispense Refill  . PARoxetine (PAXIL) 10 MG tablet Take 1 tablet (10 mg total) by mouth daily. U[place]  tablet 2  . albuterol (VENTOLIN HFA) 108 (90 Base) MCG/ACT inhaler Inhale 2 puffs into the lungs every 6 (six) hours as needed for wheezing or shortness of breath. 18 g 2  . famotidine (PEPCID) 20 MG tablet Take 1 tablet (20 mg total) by mouth 2 (two) times daily. 60 tablet 2  . gabapentin (NEURONTIN) 300 MG capsule Take 1 capsule (300 mg total) by mouth 3 (three) times daily. 90 capsule 2  . hydrOXYzine (ATARAX/VISTARIL) 50 MG tablet Take 1 tablet (50 mg total) by mouth 3 (three) times daily as needed. 90 tablet 2  . ipratropium-albuterol (DUONEB) 0.5-2.5 (3) MG/3ML SOLN 1 nebule every 4 to 6 hrs prn wheezing 360 mL 2  . LORazepam (ATIVAN) 0.5 MG tablet Take 1 tablet (0.5 mg total) by mouth in the morning, at noon, and at bedtime. 90 tablet 2  . meclizine (ANTIVERT) 25 MG tablet Take 1 tablet (25 mg total) by mouth 3 (three) times daily as needed for dizziness. 30 tablet 2  . Multiple Vitamin (MULTIVITAMIN WITH MINERALS) TABS tablet Take 1 tablet by mouth daily.    Marland Kitchen omeprazole (PRILOSEC) 40 MG capsule Take 1 capsule (40 mg total) by mouth 2 (two) times daily. 60 capsule 2  . PARoxetine (PAXIL) 40 MG tablet Take 1 tablet (40 mg total) by mouth daily. 30 tablet 2  . prazosin (MINIPRESS) 1 MG capsule Take 1 capsule (1 mg total) by mouth at bedtime. 30 capsule 3  . promethazine (PHENERGAN) 12.5 MG tablet Take 1 tablet (12.5 mg total) by mouth every 8 (eight) hours as needed for nausea or vomiting. 20 tablet 0  . QUEtiapine (SEROQUEL) 200 MG tablet Take 1 tablet (200 mg total) by mouth at bedtime. 30 tablet 2  . traZODone (DESYREL) 150 MG  tablet Take 1 tablet (150 mg total) by mouth at bedtime. 30 tablet 2   No current facility-administered medications for this visit.     Musculoskeletal: Strength & Muscle Tone: Unable to assess due to telephone visit Gait & Station: Unable to assess due to telephone visit Patient leans: N/A  Psychiatric Specialty Exam: Review of Systems  There were no vitals taken for this visit.There is no height or weight on file to calculate BMI.  General Appearance: Unable to assess due to telephone visit  Eye Contact:  Unable to assess due to telephone visit  Speech:  Clear and Coherent and Normal Rate  Volume:  Decreased  Mood:  Anxious and Depressed  Affect:  Congruent  Thought Process:  Coherent, Goal Directed and Linear  Orientation:  Full (Time, Place, and Person)  Thought Content: WDL and Logical   Suicidal Thoughts:  Yes.  without intent/plan  Homicidal Thoughts:  No  Memory:  Immediate;   Good Recent;   Good Remote;   Good  Judgement:  Good  Insight:  Fair  Psychomotor Activity:  Normal  Concentration:  Concentration: Good and Attention Span: Good  Recall:  Good  Fund of Knowledge: Good  Language: Good  Akathisia:  No  Handed:  Right  AIMS (if indicated): Not done  Assets:  Communication Skills Desire for Improvement Housing Intimacy Social Support  ADL's:  Intact  Cognition: WNL  Sleep:  Fair   Screenings: GAD-7   Flowsheet Row Video Visit from 10/24/2020 in [facility] Video Visit from 07/29/2020 in [facility] Video Visit from 06/18/2020 in [facility] Office Visit from 04/05/2020 in [place]  Office Visit from 03/06/2020 in [place]  Total GAD-7 Score 18 17 18 20 18     PHQ2-9   Flowsheet Row Video Visit from 10/24/2020 in [facility] Video Visit from 07/29/2020 in [facility] Video Visit from 06/18/2020 in [facility] Office Visit from 04/05/2020 in [place] Office Visit from 03/06/2020 in [place]  PHQ-2 Total Score 6 6 6 6 2   PHQ-9 Total Score 21 24 21 24 13     Flowsheet Row Video Visit from 10/24/2020 in [place] Error: Q7 should not be populated when Q6 is No       Assessment and Plan: Patient endorses anxiety and  depression related to life stressors and her physical health.  Today she is agreeable to increasing Paxil 40 mg to 50 mg to help manage anxiety and depression.  She is also agreeable to increasing hydroxyzine 25 mg 3 times daily to 50 mg 3 times daily as needed to help manage anxiety.  Provider discussed starting Cymbalta and discontinuing Paxil if depression anxiety does not improve by her next visit (her anxiety and depression are exacerbated by her physical health and Cymbalta may reduce pain).  Provider also notes that Seroquel may be adjusted at the next visit if her anxiety and depression has not improved.  She endorsed understanding and agreed.  She will continue all other medications as prescribed.   1. Generalized anxiety disorder  Increase- hydrOXYzine (ATARAX/VISTARIL) 50 MG tablet; Take 1 tablet (50 mg total) by mouth 3 (three) times daily as needed.  Dispense: 90 tablet; Refill: 2 Continue- LORazepam (ATIVAN) 0.5 MG tablet; Take 1 tablet (0.5 mg total) by mouth in the morning, at noon, and at bedtime.  Dispense: 90 tablet; Refill: 2 Increased (take with 10 mg of Paxil)- PARoxetine (PAXIL) 40 MG tablet; Take 1 tablet (40 mg total) by mouth daily.  Dispense: 30 tablet; Refill: 2 Start (take with 40 mg of Paxil)- PARoxetine (PAXIL) 10 MG tablet; Take 1 tablet (10 mg total) by mouth daily.  Dispense: 30 tablet; Refill: 2 Continue- QUEtiapine (SEROQUEL) 200 MG tablet; Take 1 tablet (200 mg  total) by mouth at bedtime.  Dispense: 30 tablet; Refill: 2  2. Major depressive disorder, single episode, severe with psychotic features ([place])  Increased take with 10 mg of Paxil- PARoxetine (PAXIL) 40 MG tablet; Take 1 tablet (40 mg total) by mouth daily.  Dispense: 30 tablet; Refill: 2 Start (take with 40 mg of Paxil)- PARoxetine (PAXIL) 10 MG tablet; Take 1 tablet (10 mg total) by mouth daily.  Dispense: 30 tablet; Refill: 2 Continue- QUEtiapine (SEROQUEL) 200 MG tablet; Take 1 tablet (200 mg total) by mouth at bedtime.  Dispense: 30 tablet; Refill: 2 Continue- traZODone (DESYREL) 150 MG tablet; Take 1 tablet (150 mg total) by mouth at bedtime.  Dispense: 30 tablet; Refill: 2  3. Posttraumatic stress disorder  Continue- prazosin (MINIPRESS) 1 MG capsule; Take 1 capsule (1 mg total) by mouth at bedtime.  Dispense: 30 capsule; Refill: 3   Follow up in 3 months  Follow-up with therapy  Salley Slaughter, NP 10/24/2020, 2:33 PM

## 2020-11-05 ENCOUNTER — Ambulatory Visit: Payer: Medicaid Other | Admitting: Family Medicine

## 2020-11-27 ENCOUNTER — Telehealth (HOSPITAL_COMMUNITY): Payer: Self-pay | Admitting: Psychiatry

## 2020-11-27 NOTE — Telephone Encounter (Signed)
Patient contacted office and left a voicemail around lunch time, tearful and expressing how she was having a terrible day and needed to speak with Dr. Ronne Binning. Writer contacted patient as soon as vm was heard. Patient expressed that she was having a rough time. Her stressors were her inability to sleep, her paperwork that needed to be completed for SSDI, and her racing thoughts. Patient expressed that she felt that she may be bipolar and that she feels like she was having a manic episode because she is constantly repeating herself and talking very fast. Writer listened to patient express all concerns and advised that she come in to urgent care if she felt like she needed to be see my a licensed professional sooner than later. Patient was provided with Dr. Alean Rinne next available appt, being tomorrow 5/12 via telehealth as patient requested. Patient was also provided with our crisis line phone number and instructed to call them in times such as these. Patient ended the call by stating that she is going to shower and try to make it to [facility] tonight. Patient was then calm and understanding, call was ended.

## 2020-11-28 ENCOUNTER — Telehealth (INDEPENDENT_AMBULATORY_CARE_PROVIDER_SITE_OTHER): Payer: Medicare Other | Admitting: Psychiatry

## 2020-11-28 ENCOUNTER — Other Ambulatory Visit: Payer: Self-pay

## 2020-11-28 ENCOUNTER — Encounter (HOSPITAL_COMMUNITY): Payer: Self-pay | Admitting: Psychiatry

## 2020-11-28 DIAGNOSIS — F4310 Post-traumatic stress disorder, unspecified: Secondary | ICD-10-CM

## 2020-11-28 DIAGNOSIS — F3181 Bipolar II disorder: Secondary | ICD-10-CM | POA: Insufficient documentation

## 2020-11-28 DIAGNOSIS — F411 Generalized anxiety disorder: Secondary | ICD-10-CM

## 2020-11-28 HISTORY — DX: Bipolar II disorder: F31.81

## 2020-11-28 MED ORDER — LAMOTRIGINE 25 MG PO TABS: 25.0000 mg | ORAL_TABLET | Freq: Every day | ORAL | 2 refills | Status: DC

## 2020-11-28 MED ORDER — HYDROXYZINE HCL 50 MG PO TABS: 50.0000 mg | ORAL_TABLET | Freq: Three times a day (TID) | ORAL | 2 refills | Status: DC | PRN

## 2020-11-28 MED ORDER — TRAZODONE HCL 150 MG PO TABS: 150.0000 mg | ORAL_TABLET | Freq: Every day | ORAL | 2 refills | Status: DC

## 2020-11-28 MED ORDER — QUETIAPINE FUMARATE 300 MG PO TABS: 300.0000 mg | ORAL_TABLET | Freq: Every day | ORAL | 2 refills | Status: DC

## 2020-11-28 MED ORDER — PAROXETINE HCL 40 MG PO TABS: 40.0000 mg | ORAL_TABLET | Freq: Every day | ORAL | 2 refills | Status: DC

## 2020-11-28 MED ORDER — GABAPENTIN 300 MG PO CAPS: 300.0000 mg | ORAL_CAPSULE | Freq: Three times a day (TID) | ORAL | 2 refills | Status: DC

## 2020-11-28 MED ORDER — PAROXETINE HCL 10 MG PO TABS: 10.0000 mg | ORAL_TABLET | Freq: Every day | ORAL | 2 refills | Status: DC

## 2020-11-28 MED ORDER — PRAZOSIN HCL 1 MG PO CAPS: 1.0000 mg | ORAL_CAPSULE | Freq: Every day | ORAL | 3 refills | Status: DC

## 2020-11-28 NOTE — Progress Notes (Signed)
[facility] MD/PA/NP OP Progress Note Virtual Visit via Telephone Note  I connected with Casey Jordan on 11/28/20 at  9:00 AM EDT by telephone and verified that I am speaking with the correct person using two identifiers.  Location: Patient: home Provider: Clinic   I discussed the limitations, risks, security and privacy concerns of performing an evaluation and management service by telephone and the availability of in person appointments. I also discussed with the patient that there may be a patient responsible charge related to this service. The patient expressed understanding and agreed to proceed.   I provided 35 minutes of non-face-to-face time during this encounter.            11/28/2020 10:45 AM Casey Jordan  MRN:  [id number]  Chief Complaint: "I had a melt down yesterday"  HPI: 55  year old female seen today for follow up psychiatric evaluation. She has a psychiatric history of anxiety, depression, and PTSD. She is currently managed on Ativan 0.5 mg three times daily, prazosin 1 mg nightly, hydroxyzine 25 mg 3 times daily as needed, trazodone 150 mg nightly Paxil 50 mg daily, and Seroquel 200 mg nightly. She noted that her medications are somewhat effective in managing her psychiatric conditions.  Today she logged  in however she was unable to hear provider.  The rest of her assessment was done over the phone.  During exam she was fairly groomed, tearful, easily distractible, talkative, and somewhat engaged in conversation.  She informed provider that yesterday she had a meltdown.  She notes that for the last couple weeks things have been getting worse and notes that she became hyperverbal having racing thoughts, fluctuations in her mood, poor sleep, irritability, and impulsiveness.  She informed provider that she impulsively bought herself and iPad to document bills on.  She notes that she has to document on a spreadsheet every bills she has to say when they are paid.  She notes  that this processes become overwhelming.  Provider suggested patient writing paid on her bill.  She endorsed understanding however reports that she is also trying to file her SSI paperwork on a spread sheet, which she too reports is overwhelming.  Provider asked patient if she had a lawyer to help manage her SSI and she notes that she does however feels that she too has to do paperwork.    During exam patient unable to keep focus.  She jumps from topic to topic.  She notes that yesterday she cried over a parking lot after it was repeated.  She also talked about a fear of her parents dying and then talked about her insurance plan.  She also discussed her son who she notes is unhappy with his job but staying with her to avoid leaving her and her home alone.  She also discussed her chronic pain.  Provider asked patient if this this was the first time she had symptoms such as above.  She notes that she has had these types of behaviors at least 7 times in the last few years.  She notes that her mother informed her that these behaviors occurred in the past and instructed her to call her doctor for assistance yesterday.  Patient notes that above stressors increase her anxiety and depression.  Provider conducted a GAD-7 and patient scored a 19, at her last visit she scored an 18.  Provider also conducted a PHQ-9 and patient scored a 19, at her last visit she scored a 21.  She endorses  poor appetite and poor sleep.  Patient notes that for 48 hours she stayed up.  She notes that last night she slept for the first time and this morning did not want a wake up.  Patient endorses auditory hallucinations noting that at times she feels like someone is calling her and often hears people walking in her house however they are not there.   Today she is agreeable to increasing Seroquel 200 mg to 300 mg to help manage sleep, mood, and symptoms of psychosis.  She is also agreeable to starting Lamictal 25 mg for 2 weeks and then  increasing to 50 mg to help stabilize mood.  She will follow-up with provider in 1 month for medication readjustments. Potential side effects of medication and risks vs benefits of treatment vs non-treatment were explained and discussed. All questions were answered.      Visit Diagnosis:    ICD-10-CM   1. Bipolar 2 disorder, major depressive episode (HCC)  F31.81 PARoxetine (PAXIL) 40 MG tablet    PARoxetine (PAXIL) 10 MG tablet    QUEtiapine (SEROQUEL) 300 MG tablet    traZODone (DESYREL) 150 MG tablet    lamoTRIgine (LAMICTAL) 25 MG tablet  2. Generalized anxiety disorder  F41.1 gabapentin (NEURONTIN) 300 MG capsule    hydrOXYzine (ATARAX/VISTARIL) 50 MG tablet    PARoxetine (PAXIL) 40 MG tablet    PARoxetine (PAXIL) 10 MG tablet    QUEtiapine (SEROQUEL) 300 MG tablet  3. Posttraumatic stress disorder  F43.10 prazosin (MINIPRESS) 1 MG capsule    Past Psychiatric History: anxiety, depression, and PTSD.  Past Medical History:  Past Medical History:  Diagnosis Date  . Anemia   . Asthma   . Cardiac murmur 04/30/2020  . Depression   . Dizziness 07/05/2018   Formatting of this note might be different from the original. Onset December 2019.  MRI of the brain okay.  Last Assessment & Plan:  Formatting of this note might be different from the original. Complex disease history.  Onset back in December.  Since then she has experienced syncopal episodes on at least 2 occasions.  Frequent spinning episodes and almost constant unsteadiness that is improved wi  . DVT (deep venous thrombosis) (HCC)    lower leg while traveling went to lung  . GAD (generalized anxiety disorder) 08/06/2017  . Generalized anxiety disorder 08/06/2017  . GERD (gastroesophageal reflux disease)   . Headache    Migraines  . 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 Anticoagulation x 6 months; no recurrence.   Dx 2010 Formatting of this note might be different from the original. Overview:  Anticoagulation x 6 months; no recurrence.  Dx 2010  . History of fusion of cervical spine 12/21/2016   Formatting of this note might be different from the original. 2011-2012 Formatting of this note might be different from the original. Overview:  2011-2012  . History of kidney stones   . Left ureteral calculus 11/18/2017  . Major depressive disorder, recurrent episode, moderate ([place]) 12/28/2019  . Major depressive disorder, single episode, severe with psychotic features ([place]) 06/18/2020  . Mild intermittent asthma without complication 01/16/5283  . Mixed hyperlipidemia 06/29/2017   Formatting of this note might be different from the original. 10 year CVD risk 5%  . Morbid obesity ([place]) 04/30/2020  . Nausea and vomiting 06/25/2018  . Obesity (BMI 30-39.9) 12/21/2016  . Post-traumatic stress disorder,  unspecified 12/21/2018  . Posttraumatic stress disorder 06/18/2020  . Preop cardiovascular exam 04/30/2020  . Restless leg syndrome 12/21/2016  . 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 Thrombophlebitis arm 07/06/2018   Formatting of this note might be different from the original. 06/2018: Noted on [place] RUE  . Vaginal Pap smear, abnormal   . Vitamin D insufficiency     Past Surgical History:  Procedure Laterality Date  . CHOLECYSTECTOMY    . COLONOSCOPY    . COLPOSCOPY    . HYSTEROSCOPY WITH D & C N/A 04/02/2017   Procedure: DILATATION AND CURETTAGE /HYSTEROSCOPY;  Surgeon: Linda Hedges, DO;  Location: [facility];  Service: Gynecology;  Laterality: N/A;  . KIDNEY STONE SURGERY    . OVARY SURGERY    . POSTERIOR FUSION CERVICAL SPINE    . TONSILLECTOMY      Family Psychiatric History: Unknown  Family History:  Family History  Problem Relation Age of Onset  . Diabetes Father   . Heart disease Father   . Kidney disease Father   . Diabetes Mother     Social History:  Social History   Socioeconomic History  .  Marital status: Divorced    Spouse name: Not on file  . Number of children: Not on file  . Years of education: Not on file  . Highest education level: Not on file  Occupational History  . Not on file  Tobacco Use  . Smoking status: Never Smoker  . Smokeless tobacco: Never Used  Vaping Use  . Vaping Use: Never used  Substance and Sexual Activity  . Alcohol use: Not Currently    Comment: social  . Drug use: No  . Sexual activity: Yes    Birth control/protection: None  Other Topics Concern  . Not on file  Social History Narrative  . Not on file   Social Determinants of Health   Financial Resource Strain: Not on file  Food Insecurity: Not on file  Transportation Needs: Not on file  Physical Activity: Not on file  Stress: Not on file  Social Connections: Not on file    Allergies:  Allergies  Allergen Reactions  . Codeine Nausea And Vomiting, Other (See Comments) and Anaphylaxis    nausea  Other reaction(s): Unknown  . Naproxen Swelling, Shortness Of Breath and Rash  . Avocado Other (See Comments)    Stomach pain, cause tongue and throat swelling Stomach pain, cause tongue and throat swelling  . Sulfa Antibiotics Other (See Comments)    Gets boils  . Sulfamethoxazole Other (See Comments)    Boils   . Sulfasalazine Other (See Comments)    Gets boils  . Fluconazole Palpitations    Swelling in mucosa  Swelling in mucosa  . Nickel Rash  . Ondansetron Itching    Other reaction(s): Unknown  . Penicillins Other (See Comments) and Rash     Has patient had a PCN reaction causing immediate rash, facial/tongue/throat swelling, SOB or lightheadedness with hypotension: No Has patient had a PCN reaction causing severe rash involving mucus membranes or skin necrosis: No Has patient had a PCN reaction that required hospitalization No Has patient had a PCN reaction occurring within the last 10 years: No If all of the above answers are "NO", then may proceed with Cephalosporin  use.   Has patient had a PCN reaction causing immediate rash, facial/tongue/throat swelling, SOB or lightheadedness with hypotension: No Has patient had a PCN reaction causing severe rash involving mucus membranes or skin necrosis: No  Has patient had a PCN reaction that required hospitalization No Has patient had a PCN reaction occurring within the last 10 years: No If all of the above answers are "NO", then may proceed with Cephalosporin use. rash     Metabolic Disorder Labs: Lab Results  Component Value Date   HGBA1C 6.0 (H) 04/05/2020   No results found for: PROLACTIN No results found for: CHOL, TRIG, HDL, CHOLHDL, VLDL, LDLCALC Lab Results  Component Value Date   TSH 2.090 03/06/2020    Therapeutic Level Labs: No results found for: LITHIUM No results found for: VALPROATE No components found for:  CBMZ  Current Medications: Current Outpatient Medications  Medication Sig Dispense Refill  . lamoTRIgine (LAMICTAL) 25 MG tablet Take 1 tablet (25 mg total) by mouth daily. 60 tablet 2  . albuterol (VENTOLIN HFA) 108 (90 Base) MCG/ACT inhaler Inhale 2 puffs into the lungs every 6 (six) hours as needed for wheezing or shortness of breath. 18 g 2  . famotidine (PEPCID) 20 MG tablet Take 1 tablet (20 mg total) by mouth 2 (two) times daily. 60 tablet 2  . gabapentin (NEURONTIN) 300 MG capsule Take 1 capsule (300 mg total) by mouth 3 (three) times daily. 90 capsule 2  . hydrOXYzine (ATARAX/VISTARIL) 50 MG tablet Take 1 tablet (50 mg total) by mouth 3 (three) times daily as needed. 90 tablet 2  . ipratropium-albuterol (DUONEB) 0.5-2.5 (3) MG/3ML SOLN 1 nebule every 4 to 6 hrs prn wheezing 360 mL 2  . LORazepam (ATIVAN) 0.5 MG tablet Take 1 tablet (0.5 mg total) by mouth in the morning, at noon, and at bedtime. 90 tablet 2  . meclizine (ANTIVERT) 25 MG tablet Take 1 tablet (25 mg total) by mouth 3 (three) times daily as needed for dizziness. 30 tablet 2  . Multiple Vitamin (MULTIVITAMIN  WITH MINERALS) TABS tablet Take 1 tablet by mouth daily.    Marland Kitchen omeprazole (PRILOSEC) 40 MG capsule Take 1 capsule (40 mg total) by mouth 2 (two) times daily. 60 capsule 2  . PARoxetine (PAXIL) 10 MG tablet Take 1 tablet (10 mg total) by mouth daily. 30 tablet 2  . PARoxetine (PAXIL) 40 MG tablet Take 1 tablet (40 mg total) by mouth daily. 30 tablet 2  . prazosin (MINIPRESS) 1 MG capsule Take 1 capsule (1 mg total) by mouth at bedtime. 30 capsule 3  . promethazine (PHENERGAN) 12.5 MG tablet Take 1 tablet (12.5 mg total) by mouth every 8 (eight) hours as needed for nausea or vomiting. 20 tablet 0  . QUEtiapine (SEROQUEL) 300 MG tablet Take 1 tablet (300 mg total) by mouth at bedtime. 30 tablet 2  . traZODone (DESYREL) 150 MG tablet Take 1 tablet (150 mg total) by mouth at bedtime. 30 tablet 2   No current facility-administered medications for this visit.     Musculoskeletal: Strength & Muscle Tone: Unable to assess due to telehealth visit [place]: Unable to assess due to telehealth visit Patient leans: N/A  Psychiatric Specialty Exam: Review of Systems  There were no vitals taken for this visit.There is no height or weight on file to calculate BMI.  General Appearance: Fairly Groomed  Eye Contact:  Good  Speech:  Clear and Coherent and Normal Rate  Volume:  Normal  Mood:  Anxious and Depressed  Affect:  Congruent  Thought Process:  Coherent, Goal Directed and Linear  Orientation:  Full (Time, Place, and Person)  Thought Content: WDL and Logical   Suicidal Thoughts:  Yes.  without intent/plan  Homicidal Thoughts:  No  Memory:  Immediate;   Good Recent;   Good Remote;   Good  Judgement:  Good  Insight:  Fair  Psychomotor Activity:  Normal  Concentration:  Concentration: Good and Attention Span: Good  Recall:  Good  Fund of Knowledge: Good  Language: Good  Akathisia:  No  Handed:  Right  AIMS (if indicated): Not done  Assets:  Communication Skills Desire for  Improvement Housing Intimacy Social Support  ADL's:  Intact  Cognition: WNL  Sleep:  Fair   Screenings: GAD-7   Flowsheet Row Video Visit from 11/28/2020 in [facility] Video Visit from 10/24/2020 in [facility] Video Visit from 07/29/2020 in [facility] Video Visit from 06/18/2020 in [facility] Office Visit from 04/05/2020 in [place]  Total GAD-7 Score 19 18 17 18 20     PHQ2-9   Flowsheet Row Video Visit from 11/28/2020 in [facility] Video Visit from 10/24/2020 in [facility] Video Visit from 07/29/2020 in [facility] Video Visit from 06/18/2020 in [facility] Office Visit from 04/05/2020 in [place]  PHQ-2 Total Score 4 6 6 6 6   PHQ-9 Total Score 19 21 24 21 24     Flowsheet Row Video Visit from 11/28/2020 in [facility] Video Visit from 10/24/2020 in [place] Error: Q3, 4, or 5 should not be populated when Q2 is No Error: Q7 should not be populated when Q6 is No       Assessment and Plan: Patient endorses symptoms of anxiety, depression, hypomania, and auditory hallucinations. Today she is agreeable to increasing Seroquel 200 mg to 300 mg to help manage sleep, mood, and symptoms of psychosis.  She is also agreeable to starting Lamictal 25 mg for 2 weeks and then increasing to 50 mg to help stabilize mood.     1. Generalized anxiety disorder  Continue- gabapentin (NEURONTIN) 300 MG capsule; Take 1 capsule (300 mg total) by mouth 3 (three) times daily.  Dispense: 90 capsule; Refill: 2 Continue- hydrOXYzine (ATARAX/VISTARIL) 50 MG tablet; Take 1 tablet (50 mg total) by mouth 3 (three) times daily as needed.  Dispense: 90  tablet; Refill: 2 Continue- PARoxetine (PAXIL) 40 MG tablet; Take 1 tablet (40 mg total) by mouth daily.  Dispense: 30 tablet; Refill: 2 Continue- PARoxetine (PAXIL) 10 MG tablet; Take 1 tablet (10 mg total) by mouth daily.  Dispense: 30 tablet; Refill: 2 Increased- QUEtiapine (SEROQUEL) 300 MG tablet; Take 1 tablet (300 mg total) by mouth at bedtime.  Dispense: 30 tablet; Refill: 2  2. Posttraumatic stress disorder  Continue- prazosin (MINIPRESS) 1 MG capsule; Take 1 capsule (1 mg total) by mouth at bedtime.  Dispense: 30 capsule; Refill: 3  3. Bipolar 2 disorder, major depressive episode (HCC)  Continue- PARoxetine (PAXIL) 40 MG tablet; Take 1 tablet (40 mg total) by mouth daily.  Dispense: 30 tablet; Refill: 2 Continue- PARoxetine (PAXIL) 10 MG tablet; Take 1 tablet (10 mg total) by mouth daily.  Dispense: 30 tablet; Refill: 2 Incresed- QUEtiapine (SEROQUEL) 300 MG tablet; Take 1 tablet (300 mg total) by mouth at bedtime.  Dispense: 30 tablet; Refill: 2 Continue- traZODone (DESYREL) 150 MG tablet; Take 1 tablet (150 mg total) by mouth at bedtime.  Dispense: 30 tablet;  Refill: 2 Start- lamoTRIgine (LAMICTAL) 25 MG tablet; Take 1 tablet (25 mg total) by mouth daily.  Dispense: 60 tablet; Refill: 2   Follow up in 1 months  Follow-up with therapy  Salley Slaughter, NP 11/28/2020, 10:45 AM

## 2020-11-28 NOTE — Telephone Encounter (Signed)
Provider spoke to patient today and adjust medications.  She will follow-up in 1 month for reevaluation.  Thank you for getting her in to see me.

## 2020-11-28 NOTE — Telephone Encounter (Signed)
Patient stated without question that she was not suicidal, she just wanted some help.

## 2020-12-17 DIAGNOSIS — H16301 Unspecified interstitial keratitis, right eye: Secondary | ICD-10-CM | POA: Diagnosis not present

## 2020-12-24 DIAGNOSIS — K760 Fatty (change of) liver, not elsewhere classified: Secondary | ICD-10-CM | POA: Diagnosis not present

## 2020-12-24 DIAGNOSIS — Z86010 Personal history of colonic polyps: Secondary | ICD-10-CM | POA: Diagnosis not present

## 2020-12-24 DIAGNOSIS — R1314 Dysphagia, pharyngoesophageal phase: Secondary | ICD-10-CM | POA: Diagnosis not present

## 2020-12-24 DIAGNOSIS — K5909 Other constipation: Secondary | ICD-10-CM | POA: Diagnosis not present

## 2020-12-24 DIAGNOSIS — K219 Gastro-esophageal reflux disease without esophagitis: Secondary | ICD-10-CM | POA: Diagnosis not present

## 2020-12-24 DIAGNOSIS — R1013 Epigastric pain: Secondary | ICD-10-CM | POA: Diagnosis not present

## 2020-12-27 DIAGNOSIS — M5416 Radiculopathy, lumbar region: Secondary | ICD-10-CM | POA: Diagnosis not present

## 2020-12-27 DIAGNOSIS — R030 Elevated blood-pressure reading, without diagnosis of hypertension: Secondary | ICD-10-CM | POA: Diagnosis not present

## 2020-12-27 DIAGNOSIS — G959 Disease of spinal cord, unspecified: Secondary | ICD-10-CM | POA: Diagnosis not present

## 2020-12-27 DIAGNOSIS — M5412 Radiculopathy, cervical region: Secondary | ICD-10-CM | POA: Diagnosis not present

## 2020-12-30 ENCOUNTER — Telehealth (INDEPENDENT_AMBULATORY_CARE_PROVIDER_SITE_OTHER): Payer: Medicare HMO | Admitting: Psychiatry

## 2020-12-30 ENCOUNTER — Other Ambulatory Visit: Payer: Self-pay

## 2020-12-30 ENCOUNTER — Encounter (HOSPITAL_COMMUNITY): Payer: Self-pay | Admitting: Psychiatry

## 2020-12-30 DIAGNOSIS — F411 Generalized anxiety disorder: Secondary | ICD-10-CM

## 2020-12-30 DIAGNOSIS — F4310 Post-traumatic stress disorder, unspecified: Secondary | ICD-10-CM

## 2020-12-30 DIAGNOSIS — F3181 Bipolar II disorder: Secondary | ICD-10-CM

## 2020-12-30 MED ORDER — LAMOTRIGINE 25 MG PO TABS: 25.0000 mg | ORAL_TABLET | Freq: Every day | ORAL | 2 refills | Status: DC

## 2020-12-30 MED ORDER — PRAZOSIN HCL 1 MG PO CAPS: 1.0000 mg | ORAL_CAPSULE | Freq: Every day | ORAL | 3 refills | Status: DC

## 2020-12-30 MED ORDER — PAROXETINE HCL 10 MG PO TABS: 10.0000 mg | ORAL_TABLET | Freq: Every day | ORAL | 2 refills | Status: DC

## 2020-12-30 MED ORDER — QUETIAPINE FUMARATE 300 MG PO TABS: 300.0000 mg | ORAL_TABLET | Freq: Every day | ORAL | 2 refills | Status: DC

## 2020-12-30 MED ORDER — GABAPENTIN 300 MG PO CAPS
300.0000 mg | ORAL_CAPSULE | Freq: Three times a day (TID) | ORAL | 2 refills | Status: DC
Start: 2020-12-30 — End: 2021-05-06

## 2020-12-30 MED ORDER — PAROXETINE HCL 40 MG PO TABS: 40.0000 mg | ORAL_TABLET | Freq: Every day | ORAL | 2 refills | Status: DC

## 2020-12-30 MED ORDER — LORAZEPAM 0.5 MG PO TABS: 0.5000 mg | ORAL_TABLET | Freq: Two times a day (BID) | ORAL | 2 refills | Status: AC

## 2020-12-30 MED ORDER — TRAZODONE HCL 150 MG PO TABS: 150.0000 mg | ORAL_TABLET | Freq: Every day | ORAL | 2 refills | Status: DC

## 2020-12-30 MED ORDER — HYDROXYZINE HCL 50 MG PO TABS: 50.0000 mg | ORAL_TABLET | Freq: Three times a day (TID) | ORAL | 2 refills | Status: DC | PRN

## 2020-12-30 NOTE — Progress Notes (Signed)
[place] MD/PA/NP OP Progress Note Virtual Visit via Telephone Note  I connected with Casey Jordan on 12/30/20 at  4:00 PM EDT by telephone and verified that I am speaking with the correct person using two identifiers.  Location: Patient: home Provider: Clinic   I discussed the limitations, risks, security and privacy concerns of performing an evaluation and management service by telephone and the availability of in person appointments. I also discussed with the patient that there may be a patient responsible charge related to this service. The patient expressed understanding and agreed to proceed.   I provided 35 minutes of non-face-to-face time during this encounter.            12/30/2020 4:29 PM Casey Jordan  MRN:  [id number]  Chief Complaint: "I have been really depressed these two weeks"  HPI: 55  year old female seen today for follow up psychiatric evaluation. She has a psychiatric history of anxiety, depression,bipolar disorder, and PTSD. She is currently managed on Ativan 0.5 mg three times daily, prazosin 1 mg nightly, hydroxyzine 25 mg 3 times daily as needed, trazodone 150 mg nightly Paxil 50 mg daily, and Seroquel 300 mg nightly. She noted that her medications are somewhat effective in managing her psychiatric conditions.  Today she was unable to login virtually so her assessment was done over the phone.  During exam she was pleasant, cooperative, and engaged in conversation.  She informed provider that over the last 2 weeks she has been very depressed.  She informed provider that her mood continues to fluctuate and endorses symptoms of hypomania such as racing thoughts, auditory hallucinations (she hears people talking or people talking), and irritability (notes that she is more irritable with her self).  She informed Probation officer that she becomes irritated with herself because she is unable to concentrate and complete tasks.  She notes that most days she forgets things and  then returns to bed to lie down.  She informed provider that she is in pain most days and notes that her orthopedic doctor is considering doing surgery.  She notes that she has to have an MRI done.  She informed provider that her orthopedic doctor informed her that she may have spinal cord decompression which has resulted in her chronic neck and back pain.    Patient notes that the above exacerbates her anxiety and depression.  Provider conducted a GAD-7 and patient scored a 14, at his last visit she scored a 19.  Provider also conducted a PHQ-9 and patient scored a 23, at her last visit she scored a 19.  She notes that her appetite has been poor and reports that she is lost 12 pounds.  She also notes that her sleep fluctuates noting that at times she sleeps 8 hours and other times she sleeps 12 hours.   Patient notes that she never started Lamictal however she is agreeable to starting Lamictal 25 mg for 2 weeks and then increasing to 50 mg to help manage mood.  Potential side effects of medication and risks vs benefits of treatment vs non-treatment were explained and discussed. All questions were answered. Provider informed patient that Ativan is for short-term use and recommended reducing it at this time to 0.5 mg twice daily which she was agreeable to.  Provider informed patient that Ativan will be tapered over the next few months.  She endorsed understanding and agreed.No other medications adjustments made today.  Patient notes that she is concerned because her insurance will not cover  her old therapist.  She notes that she has an appointment with a new therapist later on this month.        Visit Diagnosis:    ICD-10-CM   1. Generalized anxiety disorder  F41.1 gabapentin (NEURONTIN) 300 MG capsule    hydrOXYzine (ATARAX/VISTARIL) 50 MG tablet    PARoxetine (PAXIL) 40 MG tablet    PARoxetine (PAXIL) 10 MG tablet    QUEtiapine (SEROQUEL) 300 MG tablet    LORazepam (ATIVAN) 0.5 MG tablet    2.  Bipolar 2 disorder, major depressive episode (HCC)  F31.81 lamoTRIgine (LAMICTAL) 25 MG tablet    PARoxetine (PAXIL) 40 MG tablet    PARoxetine (PAXIL) 10 MG tablet    QUEtiapine (SEROQUEL) 300 MG tablet    traZODone (DESYREL) 150 MG tablet    3. Posttraumatic stress disorder  F43.10 prazosin (MINIPRESS) 1 MG capsule      Past Psychiatric History: anxiety, depression, and PTSD.  Past Medical History:  Past Medical History:  Diagnosis Date   Anemia    Asthma    Cardiac murmur 04/30/2020   Depression    Dizziness 07/05/2018   Formatting of this note might be different from the original. Onset December 2019.  MRI of the brain okay.  Last Assessment & Plan:  Formatting of this note might be different from the original. Complex disease history.  Onset back in December.  Since then she has experienced syncopal episodes on at least 2 occasions.  Frequent spinning episodes and almost constant unsteadiness that is improved wi   DVT (deep venous thrombosis) (HCC)    lower leg while traveling went to lung   GAD (generalized anxiety disorder) 08/06/2017   Generalized anxiety disorder 08/06/2017   GERD (gastroesophageal reflux disease)    Headache    Migraines   History of deep venous thrombosis (DVT) of distal vein of right lower extremity 12/21/2016   Formatting of this note might be different from the original. Anticoagulation x 6 months; no recurrence.  Dx 2010 Formatting of this note might be different from the original. Overview:  Anticoagulation x 6 months; no recurrence.  Dx 2010   History of fusion of cervical spine 12/21/2016   Formatting of this note might be different from the original. 2011-2012 Formatting of this note might be different from the original. Overview:  2011-2012   History of kidney stones    Left ureteral calculus 11/18/2017   Major depressive disorder, recurrent episode, moderate ([place]) 12/28/2019   Major depressive disorder, single episode, severe with psychotic features  ([place]) 06/18/2020   Mild intermittent asthma without complication 08/16/7865   Mixed hyperlipidemia 06/29/2017   Formatting of this note might be different from the original. 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 06/2018: Noted on [place] RUE   Vaginal Pap smear, abnormal    Vitamin D insufficiency     Past Surgical History:  Procedure Laterality Date   CHOLECYSTECTOMY     COLONOSCOPY     COLPOSCOPY     HYSTEROSCOPY WITH D & C N/A 04/02/2017   Procedure: DILATATION AND CURETTAGE /HYSTEROSCOPY;  Surgeon: Linda Hedges, DO;  Location: [facility];  Service: Gynecology;  Laterality: N/A;   KIDNEY STONE SURGERY     OVARY SURGERY     POSTERIOR FUSION CERVICAL SPINE     TONSILLECTOMY      Family Psychiatric History: Unknown  Family History:  Family History  Problem Relation Age of Onset   Diabetes Father    Heart disease Father    Kidney disease Father    Diabetes Mother     Social History:  Social History   Socioeconomic History   Marital status: Divorced    Spouse name: Not on file   Number of children: Not on file   Years of education: Not on file   Highest education level: Not on file  Occupational History   Not on file  Tobacco Use   Smoking status: Never   Smokeless tobacco: Never  Vaping Use   Vaping Use: Never used  Substance and Sexual Activity   Alcohol use: Not Currently    Comment: social   Drug use: No   Sexual activity: Yes    Birth control/protection: None  Other Topics Concern   Not on file  Social History Narrative   Not on file   Social Determinants of Health    Financial Resource Strain: Not on file  Food Insecurity: Not on file  Transportation Needs: Not on file  Physical Activity: Not on file  Stress: Not on file  Social Connections: Not on file    Allergies:  Allergies  Allergen Reactions   Codeine Nausea And Vomiting, Other (See Comments) and Anaphylaxis    nausea  Other reaction(s): Unknown   Naproxen Swelling, Shortness Of Breath and Rash   Avocado Other (See Comments)    Stomach pain, cause tongue and throat swelling Stomach pain, cause tongue and throat swelling   Sulfa Antibiotics Other (See Comments)    Gets boils   Sulfamethoxazole Other (See Comments)    Boils    Sulfasalazine Other (See Comments)    Gets boils   Fluconazole Palpitations    Swelling in mucosa  Swelling in mucosa   Nickel Rash   Ondansetron Itching    Other reaction(s): Unknown   Penicillins Other (See Comments) and Rash     Has patient had a PCN reaction causing immediate rash, facial/tongue/throat swelling, SOB or lightheadedness with hypotension: No Has patient had a PCN reaction causing severe rash involving mucus membranes or skin necrosis: No Has patient had a PCN reaction that required hospitalization No Has patient had a PCN reaction occurring within the last 10 years: No If all of the above answers are "NO", then may proceed with Cephalosporin use.   Has patient had a PCN reaction causing immediate rash, facial/tongue/throat swelling, SOB or lightheadedness with hypotension: No Has patient had a PCN reaction causing severe rash involving mucus membranes or skin necrosis: No Has patient had a PCN reaction that required hospitalization No Has patient had a PCN reaction occurring within the last 10 years: No If all of the above answers are "NO", then may proceed with Cephalosporin use. rash     Metabolic Disorder Labs: Lab Results  Component Value Date   HGBA1C 6.0 (H) 04/05/2020   No results found for: PROLACTIN No results found  for: CHOL, TRIG, HDL, CHOLHDL, VLDL, LDLCALC Lab Results  Component Value Date   TSH 2.090 03/06/2020    Therapeutic Level Labs: No results found for: LITHIUM No results found for: VALPROATE No components found for:  CBMZ  Current Medications: Current  Outpatient Medications  Medication Sig Dispense Refill   albuterol (VENTOLIN HFA) 108 (90 Base) MCG/ACT inhaler Inhale 2 puffs into the lungs every 6 (six) hours as needed for wheezing or shortness of breath. 18 g 2   famotidine (PEPCID) 20 MG tablet Take 1 tablet (20 mg total) by mouth 2 (two) times daily. 60 tablet 2   gabapentin (NEURONTIN) 300 MG capsule Take 1 capsule (300 mg total) by mouth 3 (three) times daily. 90 capsule 2   hydrOXYzine (ATARAX/VISTARIL) 50 MG tablet Take 1 tablet (50 mg total) by mouth 3 (three) times daily as needed. 90 tablet 2   ipratropium-albuterol (DUONEB) 0.5-2.5 (3) MG/3ML SOLN 1 nebule every 4 to 6 hrs prn wheezing 360 mL 2   lamoTRIgine (LAMICTAL) 25 MG tablet Take 1 tablet (25 mg total) by mouth daily. 60 tablet 2   LORazepam (ATIVAN) 0.5 MG tablet Take 1 tablet (0.5 mg total) by mouth 2 (two) times daily. 60 tablet 2   meclizine (ANTIVERT) 25 MG tablet Take 1 tablet (25 mg total) by mouth 3 (three) times daily as needed for dizziness. 30 tablet 2   Multiple Vitamin (MULTIVITAMIN WITH MINERALS) TABS tablet Take 1 tablet by mouth daily.     omeprazole (PRILOSEC) 40 MG capsule Take 1 capsule (40 mg total) by mouth 2 (two) times daily. 60 capsule 2   PARoxetine (PAXIL) 10 MG tablet Take 1 tablet (10 mg total) by mouth daily. 30 tablet 2   PARoxetine (PAXIL) 40 MG tablet Take 1 tablet (40 mg total) by mouth daily. 30 tablet 2   prazosin (MINIPRESS) 1 MG capsule Take 1 capsule (1 mg total) by mouth at bedtime. 30 capsule 3   promethazine (PHENERGAN) 12.5 MG tablet Take 1 tablet (12.5 mg total) by mouth every 8 (eight) hours as needed for nausea or vomiting. 20 tablet 0   QUEtiapine (SEROQUEL) 300 MG tablet  Take 1 tablet (300 mg total) by mouth at bedtime. 30 tablet 2   traZODone (DESYREL) 150 MG tablet Take 1 tablet (150 mg total) by mouth at bedtime. 30 tablet 2   No current facility-administered medications for this visit.     Musculoskeletal: Strength & Muscle Tone:  Unable to assess due to telephone visit Gait & Station:  Unable to assess due to telephone visit Patient leans: N/A  Psychiatric Specialty Exam: Review of Systems  There were no vitals taken for this visit.There is no height or weight on file to calculate BMI.  General Appearance:   Unable to assess due to telephone visit  Eye Contact:    Unable to assess due to telephone visit  Speech:  Clear and Coherent and Normal Rate  Volume:  Normal  Mood:  Anxious and Depressed  Affect:  Congruent  Thought Process:  Coherent, Goal Directed and Linear  Orientation:  Full (Time, Place, and Person)  Thought Content: Logical and Hallucinations: Auditory   Suicidal Thoughts:  Yes.  without intent/plan  Homicidal Thoughts:  No  Memory:  Immediate;   Good Recent;   Good Remote;   Good  Judgement:  Good  Insight:  Fair  Psychomotor Activity:  Normal  Concentration:  Concentration: Good and Attention Span: Good  Recall:  Good  Fund of Knowledge: Good  Language: Good  Akathisia:  No  Handed:  Right  AIMS (if indicated): Not done  Assets:  Communication Skills Desire for Improvement Housing Intimacy Social Support  ADL's:  Intact  Cognition: WNL  Sleep:  Good   Screenings:  GAD-7    Flowsheet Row Video Visit from 12/30/2020 in [facility] Video Visit from 11/28/2020 in [facility] Video Visit from 10/24/2020 in [facility] Video Visit from 07/29/2020 in [facility] Video Visit from 06/18/2020 in [facility]  Total GAD-7 Score 14 19 18 17 18       PHQ2-9    Flowsheet Row Video  Visit from 12/30/2020 in [facility] Video Visit from 11/28/2020 in [facility] Video Visit from 10/24/2020 in [facility] Video Visit from 07/29/2020 in [facility] Video Visit from 06/18/2020 in [place]  PHQ-2 Total Score 6 4 6 6 6   PHQ-9 Total Score 23 19 21 24 21       Flowsheet Row Video Visit from 12/30/2020 in [facility] Video Visit from 11/28/2020 in [facility] Video Visit from 10/24/2020 in [place] Error: Q7 should not be populated when Q6 is No Error: Q3, 4, or 5 should not be populated when Q2 is No Error: Q7 should not be populated when Q6 is No        Assessment and Plan: Patient endorses symptoms of anxiety, depression, hypomania, and auditory hallucinations.  Notes that she never started Lamictal however is agreeable to starting Lamictal 25 mg for 2 weeks and then increasing to 50 mg to help stabilize mood.  Provider informed patient that Ativan is for short-term use and recommended reducing it at this time to 0.5 mg twice daily which she was agreeable to.  Provider informed patient that Ativan will be tapered over the next few months.  She endorsed understanding and agreed.   1. Generalized anxiety disorder  Continue- gabapentin (NEURONTIN) 300 MG capsule; Take 1 capsule (300 mg total) by mouth 3 (three) times daily.  Dispense: 90 capsule; Refill: 2 Continue- hydrOXYzine (ATARAX/VISTARIL) 50 MG tablet; Take 1 tablet (50 mg total) by mouth 3 (three) times daily as needed.  Dispense: 90 tablet; Refill: 2 Continue- PARoxetine (PAXIL) 40 MG tablet; Take 1 tablet (40 mg total) by mouth daily.  Dispense: 30 tablet; Refill: 2 Continue- PARoxetine (PAXIL) 10 MG tablet; Take 1 tablet (10 mg total) by mouth daily.  Dispense: 30 tablet;  Refill: 2 Continue- QUEtiapine (SEROQUEL) 300 MG tablet; Take 1 tablet (300 mg total) by mouth at bedtime.  Dispense: 30 tablet; Refill: 2 Reduced- LORazepam (ATIVAN) 0.5 MG tablet; Take 1 tablet (0.5 mg total) by mouth 2 (two) times daily.  Dispense: 60 tablet; Refill: 2  2. Posttraumatic stress disorder  Continue- prazosin (MINIPRESS) 1 MG capsule; Take 1 capsule (1 mg total) by mouth at bedtime.  Dispense: 30 capsule; Refill: 3  3. Bipolar 2 disorder, major depressive episode (HCC)  Continue- PARoxetine (PAXIL) 40 MG tablet; Take 1 tablet (40 mg total) by mouth daily.  Dispense: 30 tablet; Refill: 2 Continue- PARoxetine (PAXIL) 10 MG tablet; Take 1 tablet (10 mg total) by mouth daily.  Dispense: 30 tablet; Refill: 2 Continue- QUEtiapine (SEROQUEL) 300 MG tablet; Take 1 tablet (300 mg total) by mouth at bedtime.  Dispense: 30 tablet; Refill: 2 Continue- traZODone (DESYREL) 150 MG tablet; Take 1 tablet (150 mg total) by mouth at bedtime.  Dispense: 30 tablet; Refill: 2 Start- lamoTRIgine (LAMICTAL) 25 MG tablet; Take 1 tablet (25 mg total) by mouth daily.  Dispense: 60 tablet; Refill: 2     Follow up in 1 months  Follow-up with therapy  Salley Slaughter, NP 12/30/2020, 4:29 PM

## 2021-01-07 ENCOUNTER — Other Ambulatory Visit: Payer: Self-pay | Admitting: Neurosurgery

## 2021-01-07 DIAGNOSIS — G959 Disease of spinal cord, unspecified: Secondary | ICD-10-CM

## 2021-01-07 DIAGNOSIS — M5416 Radiculopathy, lumbar region: Secondary | ICD-10-CM

## 2021-01-09 ENCOUNTER — Telehealth (INDEPENDENT_AMBULATORY_CARE_PROVIDER_SITE_OTHER): Payer: Medicare HMO | Admitting: Psychiatry

## 2021-01-09 DIAGNOSIS — F3181 Bipolar II disorder: Secondary | ICD-10-CM

## 2021-01-09 NOTE — Telephone Encounter (Signed)
Provider received medication request from [facility] requesting trazodone, lorazepam, and Paxil.  Provider was told by pharmacy rep that the patient had also requested these medications from Dr. Marga Melnick.  Provider spoke to patient who notes that she wants to change pharmacies from [place] to [facility].  She informed Probation officer that she has not requested these medications from Dr. Thereasa Solo.  Provider reviewed [place] and writer was the only provider filling lorazepam.  Provider also called [place] and was informed that the patient lorazepam was filled on 12/11/2020 it was delivered to her home on 12/13/2020 quantity 90.  The dose was reduced to lorazepam 0.5 mg twice daily instead of 3 times daily on 12/30/2020.  Provider informed [place] to discontinue lorazepam 0.5 mg 3 times daily.  Pharmacy staff notes that they have however notes that on 12/30/2020 a quantity of 90 was sent out instead of 60.  Provider discussed this with patient and notes lorazepam will be reduced at next visit.  She endorsed understanding however notes she is concerned about controlling her panic attacks.  Provider informed patient that lorazepam should be used for short-term and notes that other medications can be utilized to control her panic attacks.  She endorsed understanding and agreement.  She did inform provider that she would never misuse medications and inform writer that she feels like she was being accused of misuse.  Provider informed patient that misuse can be common with benzodiazepine and wanted to discuss openly with all parties involved.  She endorsed understanding.  No other concerns noted at this time

## 2021-01-10 ENCOUNTER — Encounter: Payer: Self-pay | Admitting: Family

## 2021-01-10 ENCOUNTER — Other Ambulatory Visit: Payer: Self-pay

## 2021-01-10 ENCOUNTER — Ambulatory Visit (INDEPENDENT_AMBULATORY_CARE_PROVIDER_SITE_OTHER): Payer: Medicare HMO | Admitting: Family

## 2021-01-10 VITALS — BP 130/80 | HR 90 | Temp 98.0°F | Ht 65.0 in | Wt 226.0 lb

## 2021-01-10 DIAGNOSIS — F3181 Bipolar II disorder: Secondary | ICD-10-CM | POA: Diagnosis not present

## 2021-01-10 DIAGNOSIS — G8929 Other chronic pain: Secondary | ICD-10-CM

## 2021-01-10 DIAGNOSIS — E785 Hyperlipidemia, unspecified: Secondary | ICD-10-CM | POA: Diagnosis not present

## 2021-01-10 DIAGNOSIS — K219 Gastro-esophageal reflux disease without esophagitis: Secondary | ICD-10-CM

## 2021-01-10 DIAGNOSIS — R7303 Prediabetes: Secondary | ICD-10-CM

## 2021-01-10 DIAGNOSIS — M549 Dorsalgia, unspecified: Secondary | ICD-10-CM

## 2021-01-10 DIAGNOSIS — Z13220 Encounter for screening for lipoid disorders: Secondary | ICD-10-CM | POA: Diagnosis not present

## 2021-01-10 MED ORDER — PANTOPRAZOLE SODIUM 40 MG PO TBEC: 40.0000 mg | DELAYED_RELEASE_TABLET | Freq: Two times a day (BID) | ORAL | 0 refills | Status: DC

## 2021-01-10 NOTE — Progress Notes (Signed)
Casey Jordan is a 55 y.o. female with the following history as recorded in Epic[employer]:  Patient Active Problem List   Diagnosis Date Noted   Bipolar 2 disorder, major depressive episode ([place]) 11/28/2020   Spondylolysis of cervical spine 10/01/2020   Posttraumatic stress disorder 06/18/2020   Major depressive disorder, single episode, severe with psychotic features ([place]) 06/18/2020   Preop cardiovascular exam 04/30/2020   Morbid obesity ([place]) 04/30/2020   Cardiac murmur 04/30/2020   Anemia    Asthma    Depression    DVT (deep venous thrombosis) (HCC)    Headache    History of kidney stones    Vaginal Pap smear, abnormal    Vitamin D insufficiency    Major depressive disorder, recurrent episode, moderate ([place]) 12/28/2019   Post-traumatic stress disorder, unspecified 12/21/2018   Thrombophlebitis arm 07/06/2018   Dizziness 07/05/2018   Nausea and vomiting 06/25/2018   Left ureteral calculus 11/18/2017   Generalized anxiety disorder 08/06/2017   Mild intermittent asthma without complication 46/65/9935   GAD (generalized anxiety disorder) 08/06/2017   Mixed hyperlipidemia 06/29/2017   GERD (gastroesophageal reflux disease) 05/28/2017   History of deep venous thrombosis (DVT) of distal vein of right lower extremity 12/21/2016   History of fusion of cervical spine 12/21/2016   Obesity (BMI 30-39.9) 12/21/2016   Restless leg syndrome 12/21/2016    Current Outpatient Medications  Medication Sig Dispense Refill   albuterol (VENTOLIN HFA) 108 (90 Base) MCG/ACT inhaler Inhale 2 puffs into the lungs every 6 (six) hours as needed for wheezing or shortness of breath. 18 g 2   famotidine (PEPCID) 20 MG tablet Take 1 tablet (20 mg total) by mouth 2 (two) times daily. 60 tablet 2   gabapentin (NEURONTIN) 300 MG capsule Take 1 capsule (300 mg total) by mouth 3 (three) times daily. 90 capsule 2   hydrOXYzine (ATARAX/VISTARIL) 50 MG tablet Take 1 tablet (50 mg total) by mouth 3 (three) times  daily as needed. 90 tablet 2   ipratropium-albuterol (DUONEB) 0.5-2.5 (3) MG/3ML SOLN 1 nebule every 4 to 6 hrs prn wheezing 360 mL 2   lamoTRIgine (LAMICTAL) 25 MG tablet Take 1 tablet (25 mg total) by mouth daily. 60 tablet 2   LORazepam (ATIVAN) 0.5 MG tablet Take 1 tablet (0.5 mg total) by mouth 2 (two) times daily. 60 tablet 2   meclizine (ANTIVERT) 25 MG tablet Take 1 tablet (25 mg total) by mouth 3 (three) times daily as needed for dizziness. 30 tablet 2   Multiple Vitamin (MULTIVITAMIN WITH MINERALS) TABS tablet Take 1 tablet by mouth daily.     pantoprazole (PROTONIX) 40 MG tablet Take 1 tablet (40 mg total) by mouth 2 (two) times daily before a meal. 60 tablet 0   PARoxetine (PAXIL) 40 MG tablet Take 1 tablet (40 mg total) by mouth daily. 30 tablet 2   promethazine (PHENERGAN) 12.5 MG tablet Take 1 tablet (12.5 mg total) by mouth every 8 (eight) hours as needed for nausea or vomiting. 20 tablet 0   QUEtiapine (SEROQUEL) 300 MG tablet Take 1 tablet (300 mg total) by mouth at bedtime. 30 tablet 2   traZODone (DESYREL) 150 MG tablet Take 1 tablet (150 mg total) by mouth at bedtime. 30 tablet 2   omeprazole (PRILOSEC) 40 MG capsule Take 1 capsule (40 mg total) by mouth 2 (two) times daily. (Patient not taking: Reported on 01/10/2021) 60 capsule 2   No current facility-administered medications for this visit.    Allergies: Codeine,  Naproxen, Avocado, Sulfa antibiotics, Sulfamethoxazole, Sulfasalazine, Fluconazole, Nickel, Ondansetron, and Penicillins  Past Medical History:  Diagnosis Date   Anemia    Asthma    Cardiac murmur 04/30/2020   Depression    Dizziness 07/05/2018   Formatting of this note might be different from the original. Onset December 2019.  MRI of the brain okay.  Last Assessment & Plan:  Formatting of this note might be different from the original. Complex disease history.  Onset back in December.  Since then she has experienced syncopal episodes on at least 2 occasions.   Frequent spinning episodes and almost constant unsteadiness that is improved wi   DVT (deep venous thrombosis) (HCC)    lower leg while traveling went to lung   GAD (generalized anxiety disorder) 08/06/2017   Generalized anxiety disorder 08/06/2017   GERD (gastroesophageal reflux disease)    Headache    Migraines   History of deep venous thrombosis (DVT) of distal vein of right lower extremity 12/21/2016   Formatting of this note might be different from the original. Anticoagulation x 6 months; no recurrence.  Dx 2010 Formatting of this note might be different from the original. Overview:  Anticoagulation x 6 months; no recurrence.  Dx 2010   History of fusion of cervical spine 12/21/2016   Formatting of this note might be different from the original. 2011-2012 Formatting of this note might be different from the original. Overview:  2011-2012   History of kidney stones    Left ureteral calculus 11/18/2017   Major depressive disorder, recurrent episode, moderate ([place]) 12/28/2019   Major depressive disorder, single episode, severe with psychotic features ([place]) 06/18/2020   Mild intermittent asthma without complication 10/10/5571   Mixed hyperlipidemia 06/29/2017   Formatting of this note might be different from the original. 10 year CVD risk 5%   Morbid obesity ([place]) 04/30/2020   Nausea and vomiting 06/25/2018   Obesity (BMI 30-39.9) 12/21/2016   Post-traumatic stress disorder, unspecified 12/21/2018   Posttraumatic stress disorder 06/18/2020   Preop cardiovascular exam 04/30/2020   Restless leg syndrome 12/21/2016   Spondylolysis of cervical spine 10/01/2020   Thrombophlebitis arm 07/06/2018   Formatting of this note might be different from the original. 06/2018: Noted on [place] RUE   Vaginal Pap smear, abnormal    Vitamin D insufficiency     Past Surgical History:  Procedure Laterality Date   CHOLECYSTECTOMY     COLONOSCOPY     COLPOSCOPY     HYSTEROSCOPY WITH D & C N/A 04/02/2017   Procedure:  DILATATION AND CURETTAGE /HYSTEROSCOPY;  Surgeon: Linda Hedges, DO;  Location: [facility];  Service: Gynecology;  Laterality: N/A;   KIDNEY STONE SURGERY     OVARY SURGERY     POSTERIOR FUSION CERVICAL SPINE     TONSILLECTOMY      Family History  Problem Relation Age of Onset   Kidney disease Mother    COPD Mother    Alcohol abuse Mother    Diabetes Mother    Hyperlipidemia Father    Hypertension Father    Hearing loss Father    Alcohol abuse Father    Diabetes Father    Heart disease Father    Kidney disease Father    COPD Sister    Hearing loss Brother    Birth defects Brother    Cancer Brother    Alcohol abuse Brother    Depression Son    Alcohol abuse Son    Alcohol abuse Maternal Grandmother  Stroke Maternal Grandfather    Anxiety disorder Maternal Grandfather    Miscarriages / Stillbirths Paternal Grandmother    Early death Paternal Grandmother    Diabetes Paternal Grandfather     Social History   Tobacco Use   Smoking status: Never   Smokeless tobacco: Never  Substance Use Topics   Alcohol use: Not Currently    Comment: social    Subjective:   Presents today as a new patient; majority of care is managed by specialists; Under care of psychiatry- sees monthly for Bipolar Disorder; Chronic cervical and low back pain- planning for cervical fusion and lumbar fusion; Dr. Vertell Limber will be neurosurgeon;  GYN- Dr. Adella Nissen; has not seen since 2019; prefers to let GYN manage mammogram;  Is under care of GI at [facility]- colonoscopy is scheduled for later this month; is concerned that she has hiatal hernia;      Objective:  Vitals:   01/10/21 1413  BP: 130/80  Pulse: 90  Temp: 98 F (36.7 C)  TempSrc: Oral  SpO2: 99%  Weight: 226 lb (102.5 kg)  Height: '5\' 5"'  (1.651 m)    General: Well developed, well nourished, in no acute distress  Skin : Warm and dry.  Head: Normocephalic and atraumatic  Eyes: Sclera and conjunctiva clear; pupils round and  reactive to light; extraocular movements intact  Ears: External normal; canals clear; tympanic membranes normal  Oropharynx: Pink, supple. No suspicious lesions  Neck: Supple without thyromegaly, adenopathy  Lungs: Respirations unlabored; clear to auscultation bilaterally without wheeze, rales, rhonchi  CVS exam: normal rate and regular rhythm.  Neurologic: Alert and oriented; speech intact; face symmetrical; uses cane/ slow/ unsteady gate;  Assessment:  1. Pre-diabetes   2. Lipid screening   3. Bipolar 2 disorder, major depressive episode ([place])   4. Gastroesophageal reflux disease, unspecified whether esophagitis present   5. Chronic back pain, unspecified back location, unspecified back pain laterality     Plan:  Check Hgba1c today; Check lipid panel; Continue with psychiatrist; Trial of Protonix 40 mg bid; keep planned follow up with GI; Keep planned follow up for MRI and consult with neurosurgeon;  No specific follow up here scheduled at this time;  This visit occurred during the SARS-CoV-2 public health emergency.  Safety protocols were in place, including screening questions prior to the visit, additional usage of staff PPE, and extensive cleaning of exam room while observing appropriate contact time as indicated for disinfecting solutions.    No follow-ups on file.  Orders Placed This Encounter  Procedures   CBC with Differential/Platelet   Comp Met (CMET)   Lipid panel   Hemoglobin A1c     Requested Prescriptions   Signed Prescriptions Disp Refills   pantoprazole (PROTONIX) 40 MG tablet 60 tablet 0    Sig: Take 1 tablet (40 mg total) by mouth 2 (two) times daily before a meal.

## 2021-01-11 LAB — CBC WITH DIFFERENTIAL/PLATELET
Absolute Monocytes: 337 cells/uL (ref 200–950)
Basophils Absolute: 40 cells/uL (ref 0–200)
Basophils Relative: 0.6 %
Eosinophils Absolute: 59 cells/uL (ref 15–500)
Eosinophils Relative: 0.9 %
HCT: 42.1 % (ref 35.0–45.0)
Hemoglobin: 13.8 g/dL (ref 11.7–15.5)
Lymphs Abs: 1630 cells/uL (ref 850–3900)
MCH: 27.3 pg (ref 27.0–33.0)
MCHC: 32.8 g/dL (ref 32.0–36.0)
MCV: 83.2 fL (ref 80.0–100.0)
MPV: 10.3 fL (ref 7.5–12.5)
Monocytes Relative: 5.1 %
Neutro Abs: 4534 cells/uL (ref 1500–7800)
Neutrophils Relative %: 68.7 %
Platelets: 345 10*3/uL (ref 140–400)
RBC: 5.06 10*6/uL (ref 3.80–5.10)
RDW: 13.2 % (ref 11.0–15.0)
Total Lymphocyte: 24.7 %
WBC: 6.6 10*3/uL (ref 3.8–10.8)

## 2021-01-11 LAB — COMPREHENSIVE METABOLIC PANEL
AG Ratio: 1.7 (calc) (ref 1.0–2.5)
ALT: 46 U/L — ABNORMAL HIGH (ref 6–29)
AST: 49 U/L — ABNORMAL HIGH (ref 10–35)
Albumin: 4.7 g/dL (ref 3.6–5.1)
Alkaline phosphatase (APISO): 94 U/L (ref 37–153)
BUN: 13 mg/dL (ref 7–25)
CO2: 22 mmol/L (ref 20–32)
Calcium: 10.1 mg/dL (ref 8.6–10.4)
Chloride: 104 mmol/L (ref 98–110)
Creat: 0.8 mg/dL (ref 0.50–1.05)
Globulin: 2.7 g/dL (calc) (ref 1.9–3.7)
Glucose, Bld: 92 mg/dL (ref 65–99)
Potassium: 4.2 mmol/L (ref 3.5–5.3)
Sodium: 138 mmol/L (ref 135–146)
Total Bilirubin: 0.7 mg/dL (ref 0.2–1.2)
Total Protein: 7.4 g/dL (ref 6.1–8.1)

## 2021-01-11 LAB — LIPID PANEL
Cholesterol: 266 mg/dL — ABNORMAL HIGH (ref <200)
HDL: 59 mg/dL (ref >=50)
LDL Cholesterol (Calc): 169 mg/dL (calc) — ABNORMAL HIGH
Non-HDL Cholesterol (Calc): 207 mg/dL (calc) — ABNORMAL HIGH (ref <130)
Total CHOL/HDL Ratio: 4.5 (calc) (ref <5.0)
Triglycerides: 212 mg/dL — ABNORMAL HIGH (ref <150)

## 2021-01-11 LAB — HEMOGLOBIN A1C
Hgb A1c MFr Bld: 6 % of total Hgb — ABNORMAL HIGH (ref <5.7)
Mean Plasma Glucose: 126 mg/dL
eAG (mmol/L): 7 mmol/L

## 2021-01-16 ENCOUNTER — Ambulatory Visit
Admission: RE | Admit: 2021-01-16 | Discharge: 2021-01-16 | Disposition: A | Discharge status: Home / Self Care | Payer: Medicare HMO | Source: Ambulatory Visit | Attending: Neurosurgery | Admitting: Neurosurgery

## 2021-01-16 DIAGNOSIS — M545 Low back pain, unspecified: Secondary | ICD-10-CM | POA: Diagnosis not present

## 2021-01-16 DIAGNOSIS — M48061 Spinal stenosis, lumbar region without neurogenic claudication: Secondary | ICD-10-CM | POA: Diagnosis not present

## 2021-01-16 DIAGNOSIS — M47812 Spondylosis without myelopathy or radiculopathy, cervical region: Secondary | ICD-10-CM | POA: Diagnosis not present

## 2021-01-16 DIAGNOSIS — M50221 Other cervical disc displacement at C4-C5 level: Secondary | ICD-10-CM | POA: Diagnosis not present

## 2021-01-16 DIAGNOSIS — M4802 Spinal stenosis, cervical region: Secondary | ICD-10-CM | POA: Diagnosis not present

## 2021-01-16 DIAGNOSIS — M5416 Radiculopathy, lumbar region: Secondary | ICD-10-CM

## 2021-01-16 DIAGNOSIS — G959 Disease of spinal cord, unspecified: Secondary | ICD-10-CM

## 2021-01-16 IMAGING — MR MR LUMBAR SPINE W/O CM
4 of 5 series · 26 of 48 positions shown · IV contrast (multihance)
Comparison: Radiographs [DATE].

MRI of the cervical spine [DATE].

CLINICAL DATA: Neck pain.  Low back pain.

EXAM:
MRI CERVICAL SPINE WITHOUT AND WITH CONTRAST
MRI LUMBAR SPINE WITHOUT CONTRAST
TECHNIQUE: Multiplanar and multiecho pulse sequences of the cervical spine, to
include the craniocervical junction and cervicothoracic junction,
were obtained without and with intravenous contrast.
Multiplanar and multiecho pulse sequences of the lumbar spine were
obtained without intravenous contrast.
CONTRAST:  20mL MULTIHANCE GADOBENATE DIMEGLUMINE 529 MG/ML IV SOLN

[Series 3: T2 · sagittal · 4.0mm · 1.09mm/px · 6 of 17 slices shown (1 of 2)]
[im 1/17]
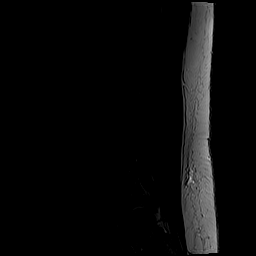
[im 4/17]
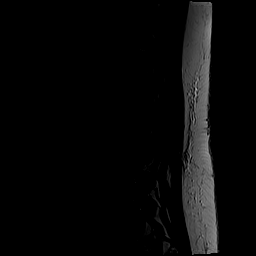
[im 7/17]
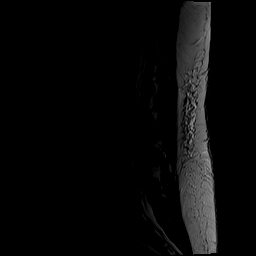
[im 10/17]
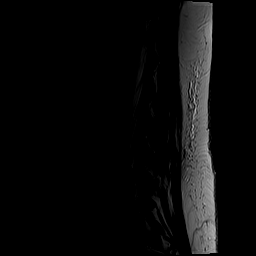
[im 13/17]
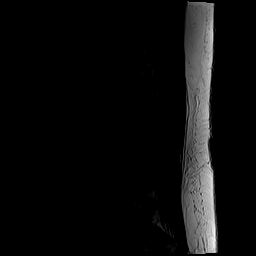
[im 17/17]
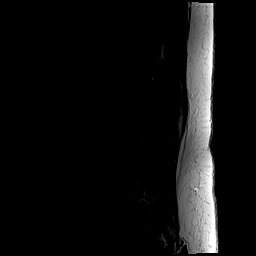

[Series 5: T1 · sagittal · 4.0mm · 1.09mm/px · 6 of 17 slices shown (1 of 2)]
[im 1/17]
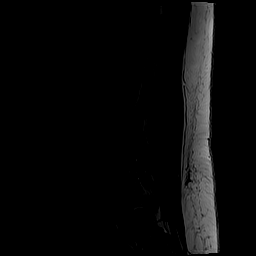
[im 4/17]
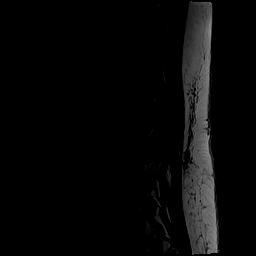
[im 7/17]
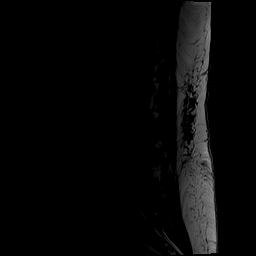
[im 10/17]
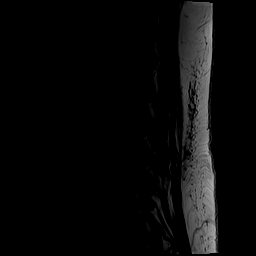
[im 13/17]
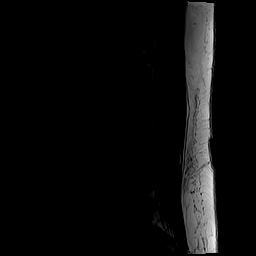
[im 17/17]
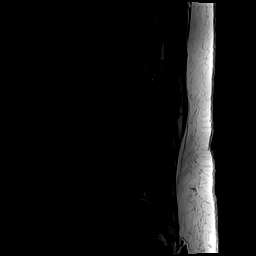

[Series 6: T2 · axial · 4.0mm · 0.39mm/px · z∈[-93,+143]mm · 9 of 46 slices shown (2 of 2)]
[im 1/46]
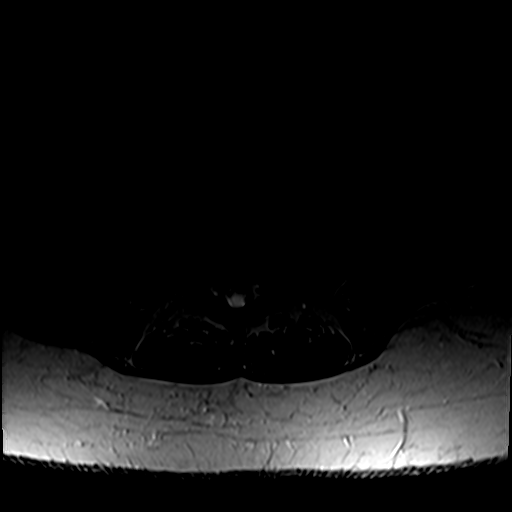
[im 7/46]
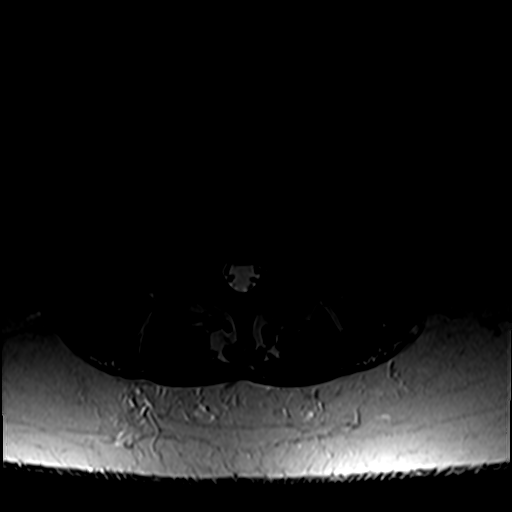
[im 13/46]
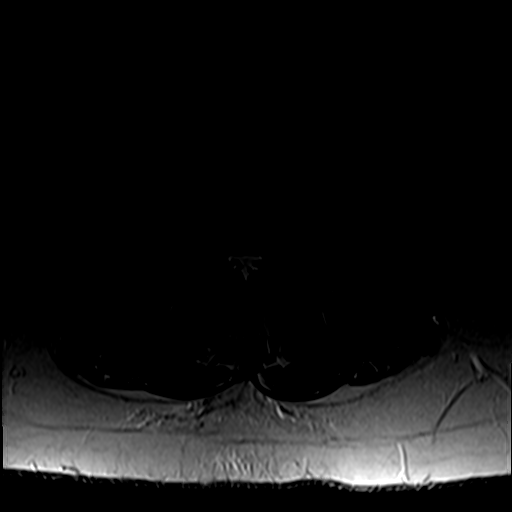
[im 20/46]
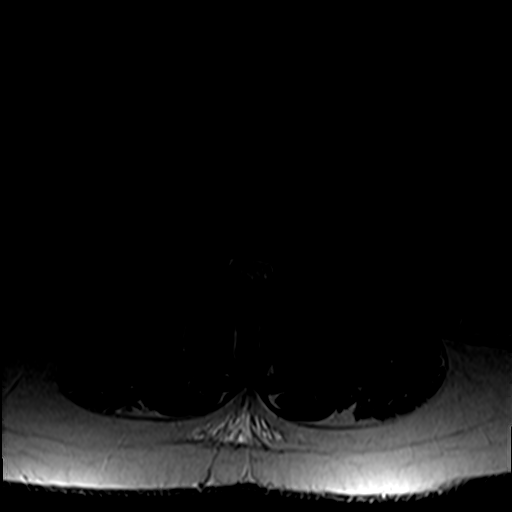
[im 23/46]
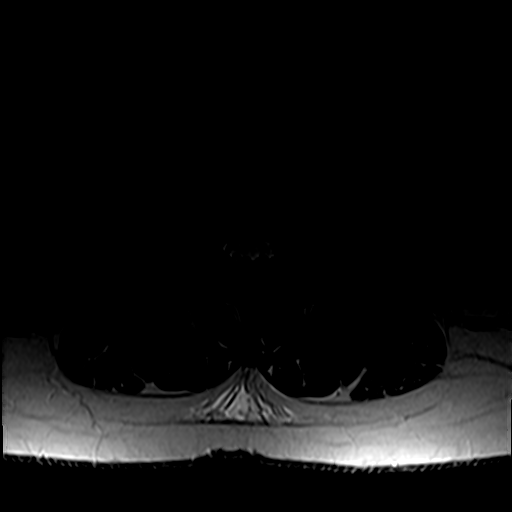
[im 26/46]
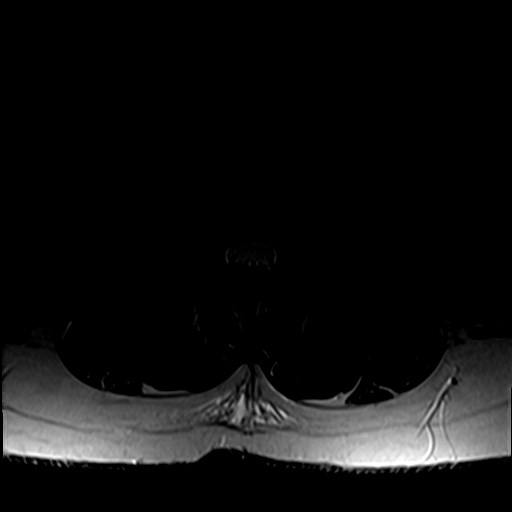
[im 33/46]
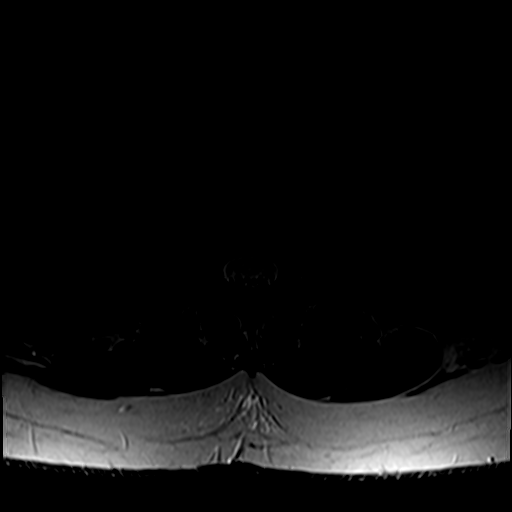
[im 39/46]
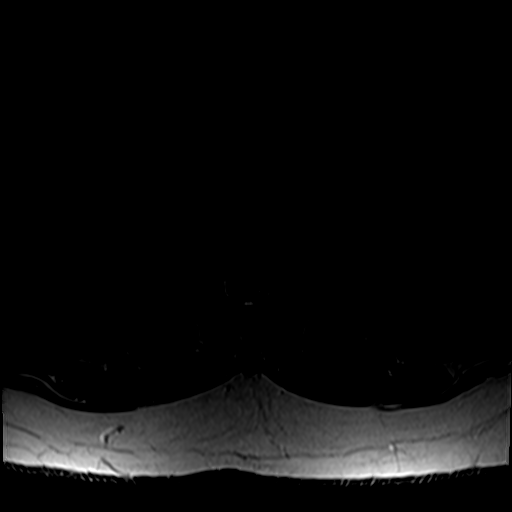
[im 46/46]
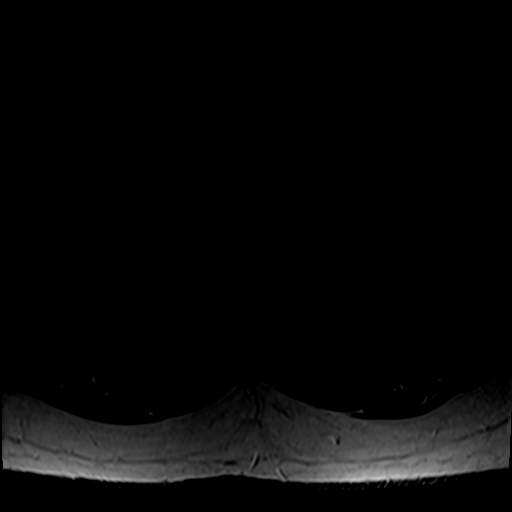

[Series 7: T1 · axial · 4.0mm · 0.39mm/px · z∈[-93,+110]mm · 5 of 46 slices shown (2 of 2)]
[im 1/46]
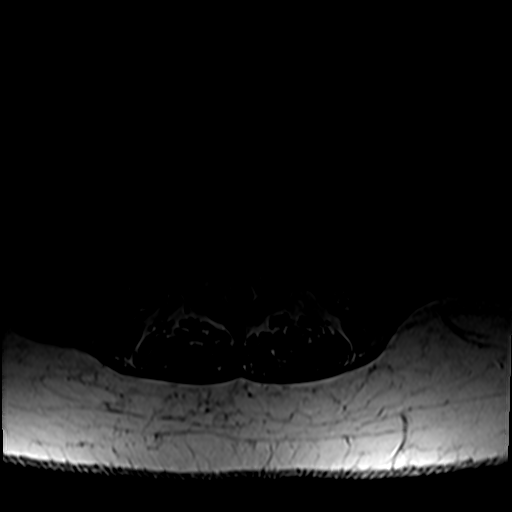
[im 7/46]
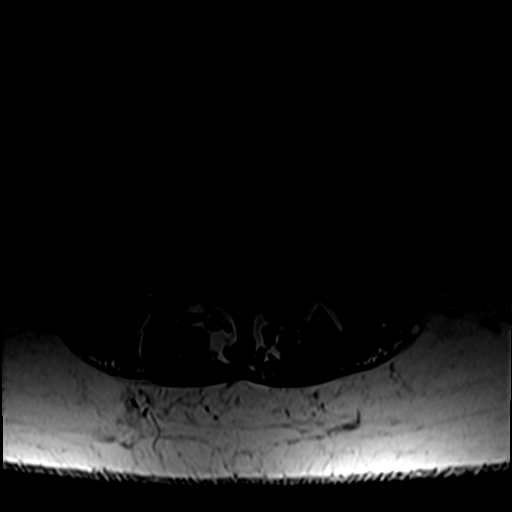
[im 13/46]
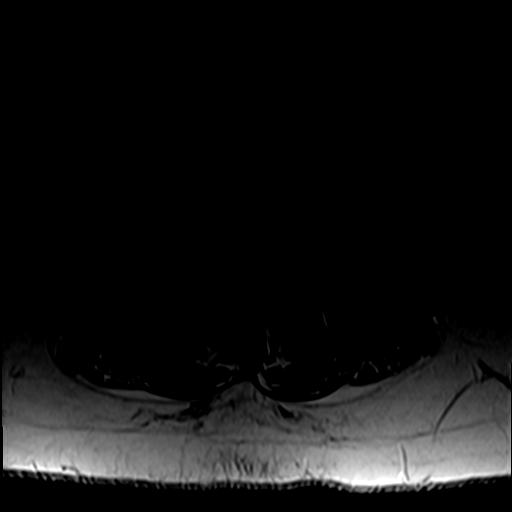
[im 23/46]
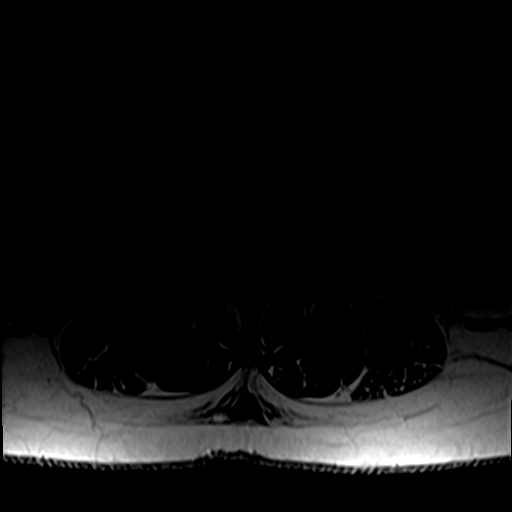
[im 39/46]
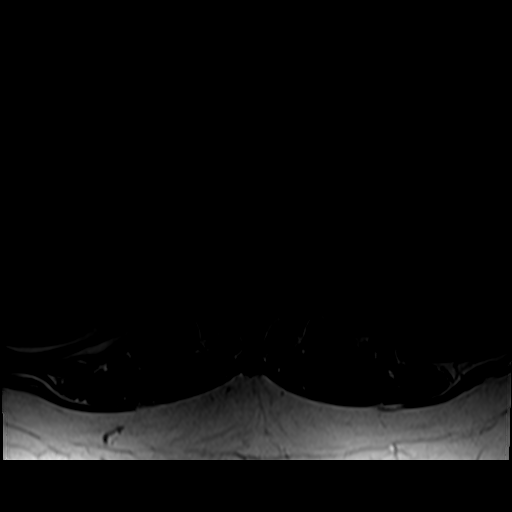

[26 of 48 positions shown; findings below may reference images not displayed]

FINDINGS: MRI CERVICAL SPINE FINDINGS

Alignment: Straightening of the cervical curvature.

Vertebrae: No fracture, evidence of discitis, or bone lesion.
Postsurgical changes from C5 through C7 ACDF.

Cord: Normal signal and morphology.

Posterior Fossa, vertebral arteries, paraspinal tissues: Negative.

Disc levels:

C2-3: Facet degenerative changes resulting in mild left neural
foraminal narrowing.

C3-4: No spinal canal or neural foraminal stenosis.

C4-5: Small posterior disc protrusion resulting in mild spinal canal
stenosis. Uncovertebral and facet degenerative changes resulting in
mild right and mild-to-moderate left neural foraminal narrowing.

C5-6: Right asymmetric endplate and uncovertebral ridging resulting
in mild narrowing of the spinal canal on the right side and mild
bilateral neural foraminal narrowing, right greater than left.

C6-7: Posterior endplate ridging resulting in mild spinal canal
stenosis with mild mass effect without cord signal abnormality. No
significant neural foraminal narrowing.

C7-T1: Mild facet degenerative changes. No significant spinal canal
or neural foraminal stenosis.

No significant change from prior MRI.

MRI LUMBAR SPINE FINDINGS

Segmentation:  Standard.

Alignment:  Physiologic.

Vertebrae: No fracture, evidence of discitis, or bone lesion.
Chronic endplate degenerative changes at L5-S1.

Conus medullaris and cauda equina: Conus extends to the L1 level.
Conus and cauda equina appear normal.

Paraspinal and other soft tissues: Negative.

Disc levels:

T12-L1: No spinal canal or neural foraminal stenosis.

L1-2: No spinal canal or neural foraminal stenosis.

L2-3: Shallow disc bulge and mild facet degenerative changes without
significant spinal canal or neural foraminal stenosis.

L3-4: Moderate hypertrophic facet degenerative changes. No spinal
canal or neural foraminal stenosis.

L4-5: Loss of disc height, disc bulge and advanced hypertrophic
facet degenerative changes resulting in narrowing of the bilateral
subarticular zones and moderate bilateral neural foraminal
narrowing.

L5-S1: Loss of disc height, disc bulge with associated osteophytic
component and moderate facet degenerative changes resulting in
moderate bilateral neural foraminal narrowing. No spinal canal
stenosis.
IMPRESSION: 1. Stable degenerative changes of the cervical spine status post
C5-C7. Mild spinal canal stenosis at C4-5, C5-6 and C6-7.
2. Mild right and mild-to-moderate neural foraminal narrowing at
C4-5.
3. Degenerative changes of the lumbar spine, more pronounced at the
facet joints at L3-4, L4-5 and L5-S1.
4. Moderate bilateral neural foraminal narrowing at L4-5 and L5-S1.

## 2021-01-16 IMAGING — MR MR CERVICAL SPINE WO/W CM
6 of 8 series · 31 of 48 positions shown · IV contrast (multihance)
Comparison: Radiographs [DATE].

MRI of the cervical spine [DATE].

CLINICAL DATA: Neck pain.  Low back pain.

EXAM:
MRI CERVICAL SPINE WITHOUT AND WITH CONTRAST
MRI LUMBAR SPINE WITHOUT CONTRAST
TECHNIQUE: Multiplanar and multiecho pulse sequences of the cervical spine, to
include the craniocervical junction and cervicothoracic junction,
were obtained without and with intravenous contrast.
Multiplanar and multiecho pulse sequences of the lumbar spine were
obtained without intravenous contrast.
CONTRAST:  20mL MULTIHANCE GADOBENATE DIMEGLUMINE 529 MG/ML IV SOLN

[Series 2: T1 · sagittal · 3.0mm · 0.82mm/px · 3 of 15 slices shown (1 of 2)]
[im 1/15]
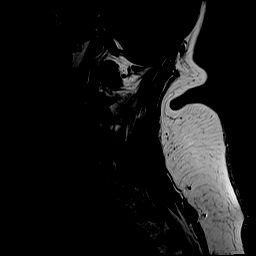
[im 8/15]
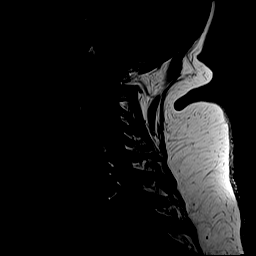
[im 15/15]
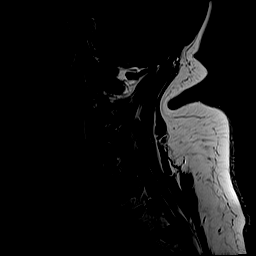

[Series 3: STIR · sagittal · 3.0mm · 0.82mm/px · 4 of 15 slices shown]
[im 1/15]
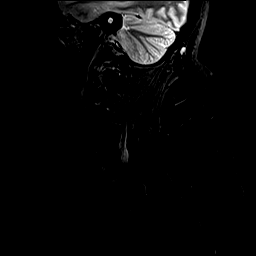
[im 5/15]
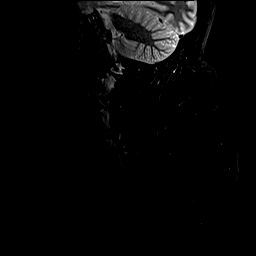
[im 10/15]
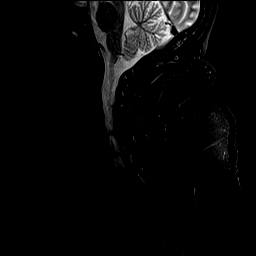
[im 15/15]
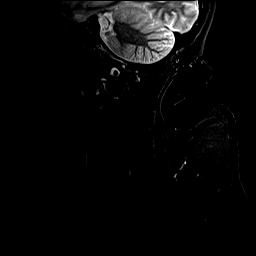

[Series 4: T2 · axial · 3.0mm · 0.70mm/px · z∈[-47,+44]mm · 8 of 26 slices shown (1 of 2)]
[im 1/26]
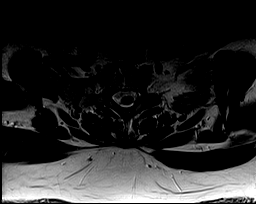
[im 4/26]
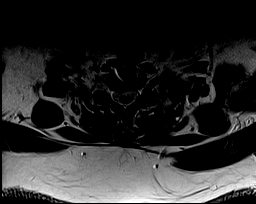
[im 8/26]
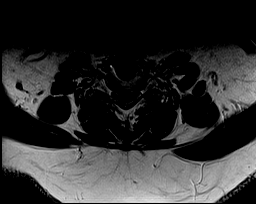
[im 11/26]
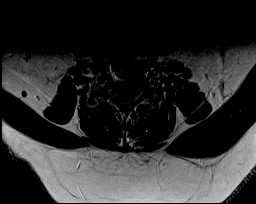
[im 15/26]
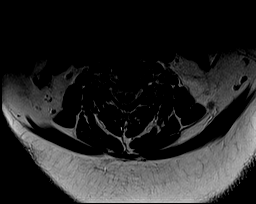
[im 18/26]
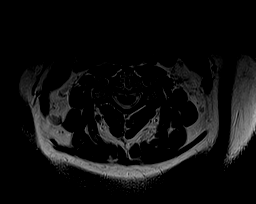
[im 22/26]
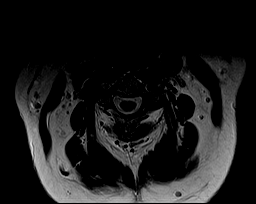
[im 26/26]
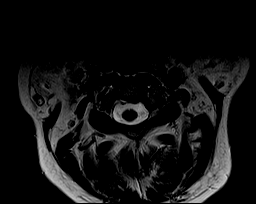

[Series 5: T2 · sagittal · 3.0mm · 0.41mm/px · 4 of 15 slices shown (2 of 2)]
[im 1/15]
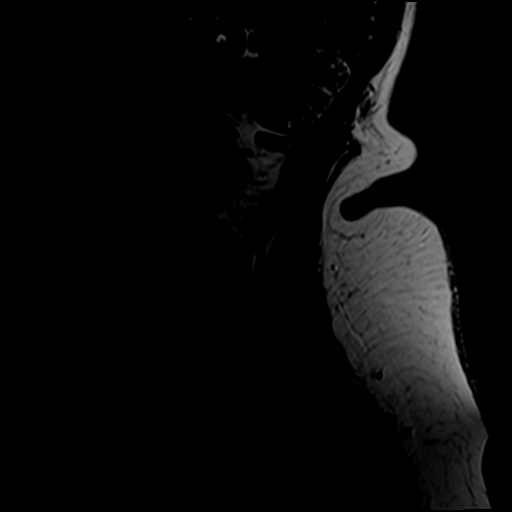
[im 5/15]
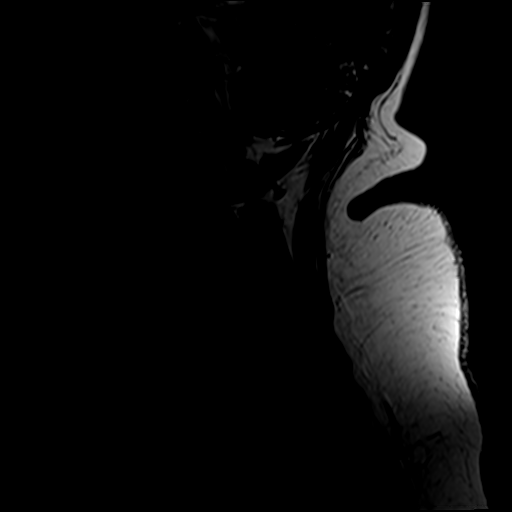
[im 10/15]
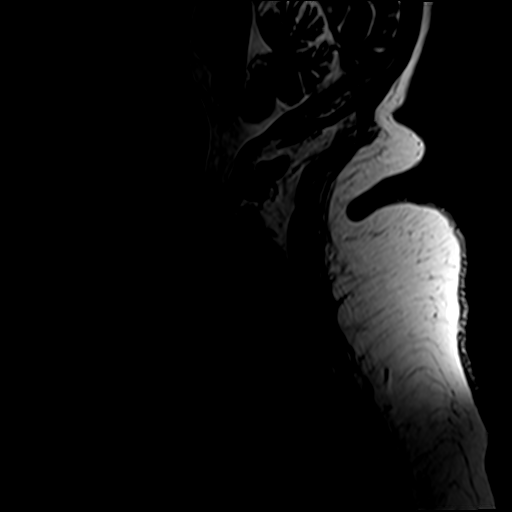
[im 15/15]
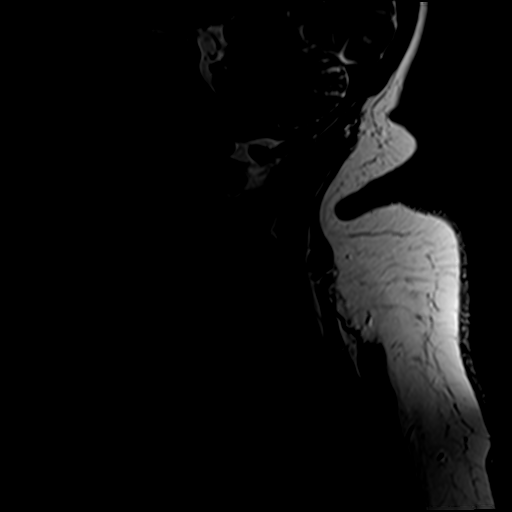

[Series 7: T1 · axial · 3.0mm · 0.35mm/px · z∈[-53,+38]mm · 8 of 26 slices shown (2 of 2)]
[im 1/26]
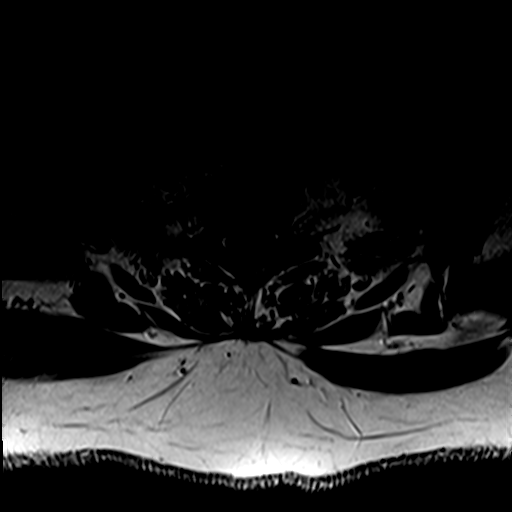
[im 4/26]
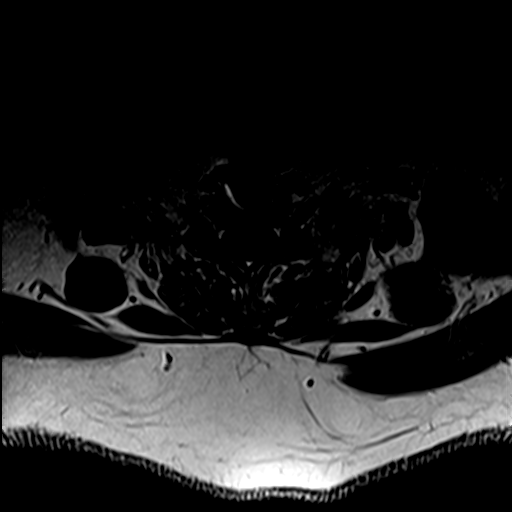
[im 8/26]
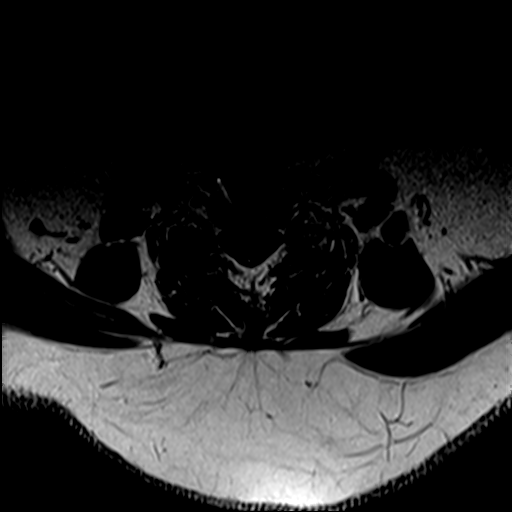
[im 11/26]
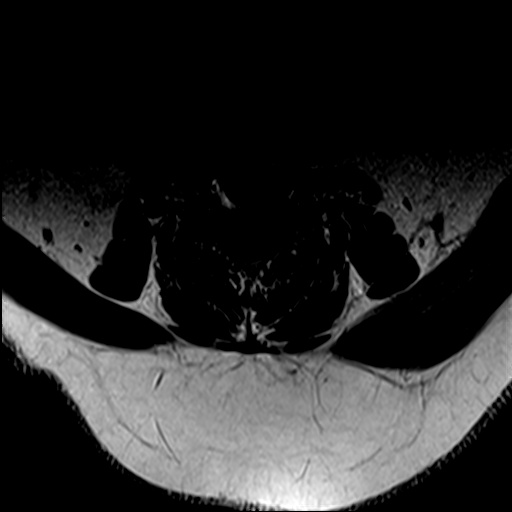
[im 15/26]
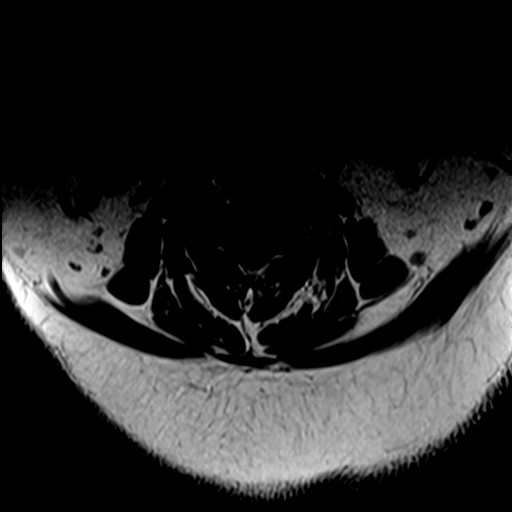
[im 18/26]
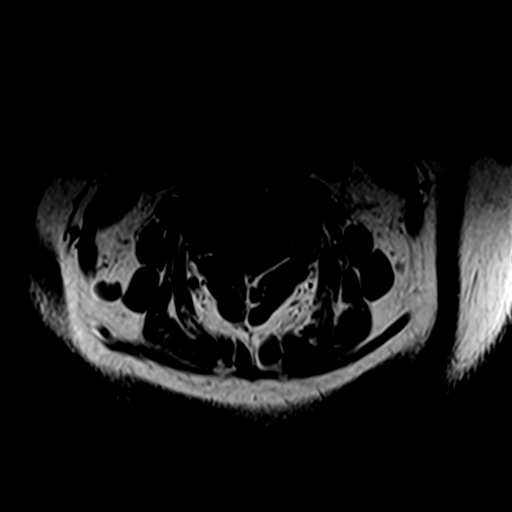
[im 22/26]
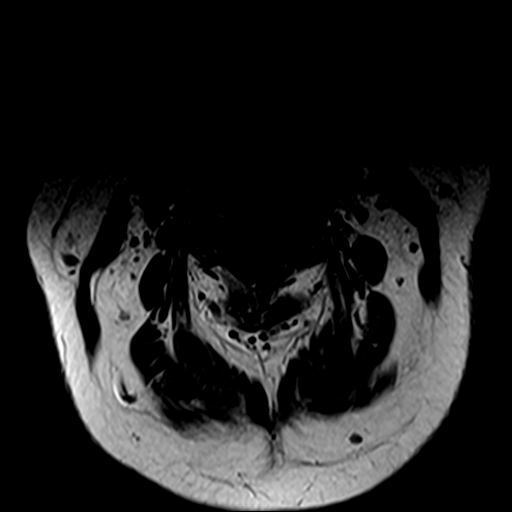
[im 26/26]
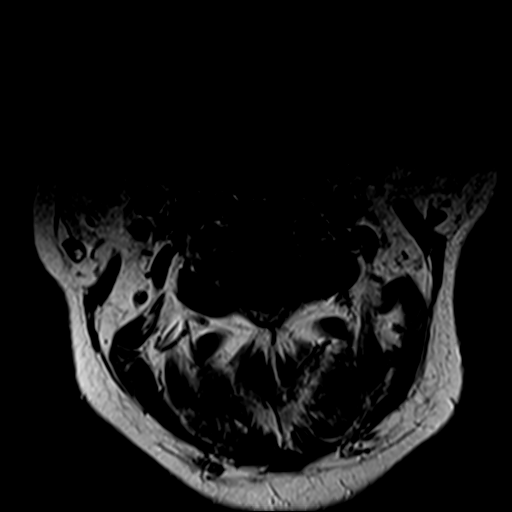

[Series 9: T1 fat-sat post-contrast · sagittal · 3.0mm · 0.82mm/px · 4 of 13 slices shown]
[im 1/13]
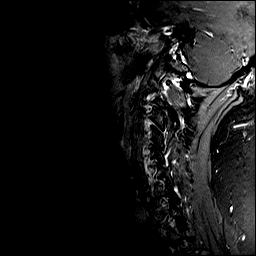
[im 5/13]
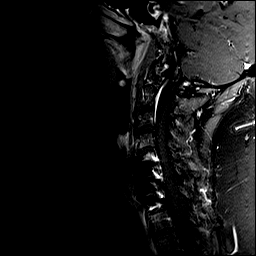
[im 9/13]
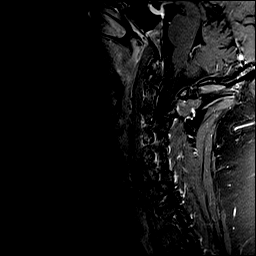
[im 13/13]
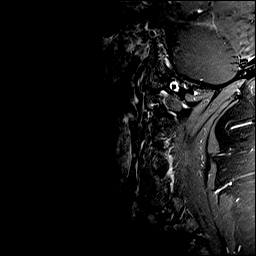

[31 of 48 positions shown; findings below may reference images not displayed]

FINDINGS: MRI CERVICAL SPINE FINDINGS

Alignment: Straightening of the cervical curvature.

Vertebrae: No fracture, evidence of discitis, or bone lesion.
Postsurgical changes from C5 through C7 ACDF.

Cord: Normal signal and morphology.

Posterior Fossa, vertebral arteries, paraspinal tissues: Negative.

Disc levels:

C2-3: Facet degenerative changes resulting in mild left neural
foraminal narrowing.

C3-4: No spinal canal or neural foraminal stenosis.

C4-5: Small posterior disc protrusion resulting in mild spinal canal
stenosis. Uncovertebral and facet degenerative changes resulting in
mild right and mild-to-moderate left neural foraminal narrowing.

C5-6: Right asymmetric endplate and uncovertebral ridging resulting
in mild narrowing of the spinal canal on the right side and mild
bilateral neural foraminal narrowing, right greater than left.

C6-7: Posterior endplate ridging resulting in mild spinal canal
stenosis with mild mass effect without cord signal abnormality. No
significant neural foraminal narrowing.

C7-T1: Mild facet degenerative changes. No significant spinal canal
or neural foraminal stenosis.

No significant change from prior MRI.

MRI LUMBAR SPINE FINDINGS

Segmentation:  Standard.

Alignment:  Physiologic.

Vertebrae: No fracture, evidence of discitis, or bone lesion.
Chronic endplate degenerative changes at L5-S1.

Conus medullaris and cauda equina: Conus extends to the L1 level.
Conus and cauda equina appear normal.

Paraspinal and other soft tissues: Negative.

Disc levels:

T12-L1: No spinal canal or neural foraminal stenosis.

L1-2: No spinal canal or neural foraminal stenosis.

L2-3: Shallow disc bulge and mild facet degenerative changes without
significant spinal canal or neural foraminal stenosis.

L3-4: Moderate hypertrophic facet degenerative changes. No spinal
canal or neural foraminal stenosis.

L4-5: Loss of disc height, disc bulge and advanced hypertrophic
facet degenerative changes resulting in narrowing of the bilateral
subarticular zones and moderate bilateral neural foraminal
narrowing.

L5-S1: Loss of disc height, disc bulge with associated osteophytic
component and moderate facet degenerative changes resulting in
moderate bilateral neural foraminal narrowing. No spinal canal
stenosis.
IMPRESSION: 1. Stable degenerative changes of the cervical spine status post
C5-C7. Mild spinal canal stenosis at C4-5, C5-6 and C6-7.
2. Mild right and mild-to-moderate neural foraminal narrowing at
C4-5.
3. Degenerative changes of the lumbar spine, more pronounced at the
facet joints at L3-4, L4-5 and L5-S1.
4. Moderate bilateral neural foraminal narrowing at L4-5 and L5-S1.

## 2021-01-16 MED ORDER — GADOBENATE DIMEGLUMINE 529 MG/ML IV SOLN
20.0000 mL | Freq: Once | INTRAVENOUS | Status: AC | PRN
  Administered 2021-01-16: 20 mL via INTRAVENOUS

## 2021-01-16 MED ORDER — GADOBENATE DIMEGLUMINE 529 MG/ML IV SOLN
20.0000 mL | Freq: Once | INTRAVENOUS | Status: DC | PRN
Start: 2021-01-16 — End: 2021-01-17

## 2021-01-22 ENCOUNTER — Telehealth (HOSPITAL_COMMUNITY): Payer: Medicare Other | Admitting: Psychiatry

## 2021-01-23 ENCOUNTER — Telehealth: Payer: Self-pay

## 2021-01-23 DIAGNOSIS — K219 Gastro-esophageal reflux disease without esophagitis: Secondary | ICD-10-CM

## 2021-01-23 NOTE — Telephone Encounter (Signed)
Copied from [place] [id number]. Topic: General - Other >> Jan 15, 2021  4:11 PM Pawlus, Brayton Layman A wrote: Reason for CRM: Pharmacy following up on refill requests sent over on 6/22, please advise.   famotidine (PEPCID) 20 MG tablet meclizine (ANTIVERT) 25 MG tablet omeprazole (PRILOSEC) 40 MG capsule

## 2021-01-27 ENCOUNTER — Other Ambulatory Visit: Payer: Self-pay | Admitting: Physician Assistant

## 2021-01-27 DIAGNOSIS — K219 Gastro-esophageal reflux disease without esophagitis: Secondary | ICD-10-CM

## 2021-01-27 NOTE — Telephone Encounter (Signed)
Requested medication (s) are due for refill today:   Yes  Requested medication (s) are on the active medication list:   Yes  Future visit scheduled:   No   Last ordered: Needs new Rx.   No more refills remaining   Requested Prescriptions  Pending Prescriptions Disp Refills   famotidine (PEPCID) 20 MG tablet 60 tablet 2    Sig: Take 1 tablet (20 mg total) by mouth 2 (two) times daily.      Gastroenterology:  H2 Antagonists Passed - 01/27/2021 12:42 PM      Passed - Valid encounter within last 12 months    Recent Outpatient Visits           9 months ago Preoperative evaluation to rule out surgical contraindication   [place], MD   10 months ago Screening for diabetes mellitus   [place], [place], [place]   1 year ago Neck pain   [place], [place], NP   1 year ago Need for immunization against influenza   [place], RPH-CPP   1 year ago Acute mid back pain   [place], MD       Future Appointments             In 2 months Revankar, Reita Cliche, MD [facility]

## 2021-01-27 NOTE — Telephone Encounter (Signed)
Copied from [id number] [phone]. Topic: Quick Communication - Rx Refill/Question >> Jan 27, 2021 12:01 PM Yvette Rack wrote: Medication: famotidine (PEPCID) 20 MG tablet - instructions: Take 1 tablet by mouth two times daily   Has the patient contacted their pharmacy? Yes.   (Agent: If no, request that the patient contact the pharmacy for the refill.) (Agent: If yes, when and what did the pharmacy advise?)  Preferred Pharmacy (with phone number or street name): [facility] - [place], [place]  Phone:  [phone] Fax:  [phone]  Agent: Please be advised that RX refills may take up to 3 business days. We ask that you follow-up with your pharmacy.

## 2021-01-28 MED ORDER — FAMOTIDINE 20 MG PO TABS: 20.0000 mg | ORAL_TABLET | Freq: Two times a day (BID) | ORAL | 0 refills | Status: AC

## 2021-01-28 MED ORDER — OMEPRAZOLE 40 MG PO CPDR: 40.0000 mg | DELAYED_RELEASE_CAPSULE | Freq: Two times a day (BID) | ORAL | 0 refills | Status: AC

## 2021-01-28 MED ORDER — MECLIZINE HCL 25 MG PO TABS
25.0000 mg | ORAL_TABLET | Freq: Three times a day (TID) | ORAL | 2 refills | Status: AC | PRN
Start: 2021-01-28 — End: ?

## 2021-02-03 ENCOUNTER — Telehealth (HOSPITAL_COMMUNITY): Payer: Medicare HMO | Admitting: Psychiatry

## 2021-02-04 ENCOUNTER — Emergency Department (HOSPITAL_BASED_OUTPATIENT_CLINIC_OR_DEPARTMENT_OTHER)
Admission: EM | Admit: 2021-02-04 | Discharge: 2021-02-04 | Disposition: A | Discharge status: Home / Self Care | Payer: Medicare HMO | Attending: Emergency Medicine | Admitting: Emergency Medicine

## 2021-02-04 ENCOUNTER — Emergency Department (HOSPITAL_BASED_OUTPATIENT_CLINIC_OR_DEPARTMENT_OTHER): Payer: Medicare HMO

## 2021-02-04 ENCOUNTER — Other Ambulatory Visit: Payer: Self-pay

## 2021-02-04 ENCOUNTER — Encounter (HOSPITAL_BASED_OUTPATIENT_CLINIC_OR_DEPARTMENT_OTHER): Payer: Self-pay

## 2021-02-04 DIAGNOSIS — K219 Gastro-esophageal reflux disease without esophagitis: Secondary | ICD-10-CM | POA: Diagnosis not present

## 2021-02-04 DIAGNOSIS — R1013 Epigastric pain: Secondary | ICD-10-CM | POA: Diagnosis not present

## 2021-02-04 DIAGNOSIS — J45909 Unspecified asthma, uncomplicated: Secondary | ICD-10-CM | POA: Diagnosis not present

## 2021-02-04 DIAGNOSIS — Z7951 Long term (current) use of inhaled steroids: Secondary | ICD-10-CM | POA: Insufficient documentation

## 2021-02-04 LAB — COMPREHENSIVE METABOLIC PANEL
ALT: 61 U/L — ABNORMAL HIGH (ref 0–44)
AST: 79 U/L — ABNORMAL HIGH (ref 15–41)
Albumin: 4.3 g/dL (ref 3.5–5.0)
Alkaline Phosphatase: 102 U/L (ref 38–126)
Anion gap: 10 (ref 5–15)
BUN: 13 mg/dL (ref 6–20)
CO2: 24 mmol/L (ref 22–32)
Calcium: 9.6 mg/dL (ref 8.9–10.3)
Chloride: 103 mmol/L (ref 98–111)
Creatinine, Ser: 0.84 mg/dL (ref 0.44–1.00)
GFR, Estimated: >60 mL/min (ref >60)
Glucose, Bld: 116 mg/dL — ABNORMAL HIGH (ref 70–99)
Potassium: 3.7 mmol/L (ref 3.5–5.1)
Sodium: 137 mmol/L (ref 135–145)
Total Bilirubin: 0.4 mg/dL (ref 0.3–1.2)
Total Protein: 8 g/dL (ref 6.5–8.1)

## 2021-02-04 LAB — CBC WITH DIFFERENTIAL/PLATELET
Abs Immature Granulocytes: 0.03 10*3/uL (ref 0.00–0.07)
Basophils Absolute: 0 10*3/uL (ref 0.0–0.1)
Basophils Relative: 0 %
Eosinophils Absolute: 0.1 10*3/uL (ref 0.0–0.5)
Eosinophils Relative: 1 %
HCT: 40.3 % (ref 36.0–46.0)
Hemoglobin: 13.8 g/dL (ref 12.0–15.0)
Immature Granulocytes: 0 %
Lymphocytes Relative: 24 %
Lymphs Abs: 2.2 10*3/uL (ref 0.7–4.0)
MCH: 28.7 pg (ref 26.0–34.0)
MCHC: 34.2 g/dL (ref 30.0–36.0)
MCV: 83.8 fL (ref 80.0–100.0)
Monocytes Absolute: 0.6 10*3/uL (ref 0.1–1.0)
Monocytes Relative: 6 %
Neutro Abs: 6.4 10*3/uL (ref 1.7–7.7)
Neutrophils Relative %: 69 %
Platelets: 353 10*3/uL (ref 150–400)
RBC: 4.81 MIL/uL (ref 3.87–5.11)
RDW: 13.8 % (ref 11.5–15.5)
WBC: 9.3 10*3/uL (ref 4.0–10.5)
nRBC: 0 % (ref 0.0–0.2)

## 2021-02-04 LAB — LIPASE, BLOOD: Lipase: 49 U/L (ref 11–51)

## 2021-02-04 IMAGING — CT CT ABD-PELV W/ CM
2 of 5 series · 16 of 46 positions shown, 18 images · IV contrast (Omnipaque)
Comparison: [DATE]

CLINICAL DATA: Epigastric abdominal pain following upper and lower
endoscopy

EXAM:
CT ABDOMEN AND PELVIS WITH CONTRAST
TECHNIQUE: Multidetector CT imaging of the abdomen and pelvis was performed
using the standard protocol following bolus administration of
intravenous contrast.
CONTRAST:  100mL OMNIPAQUE IOHEXOL 300 MG/ML  SOLN

[Series 2: axial st · axial · 0.98mm/px · z∈[+674,+1094]mm · 13 of 94 slices shown, 15 images]
[im 5/94  soft-tissue]
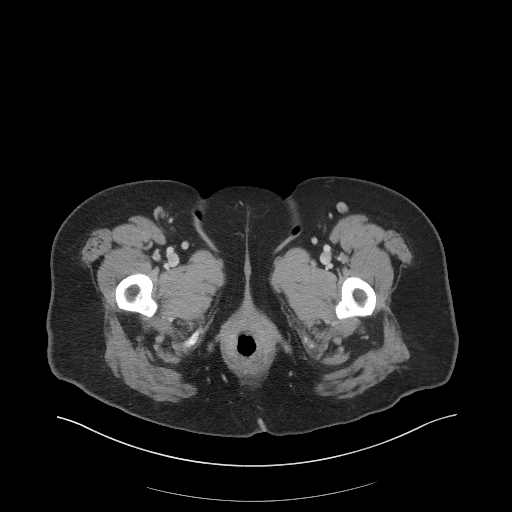
[im 5/94  bone]
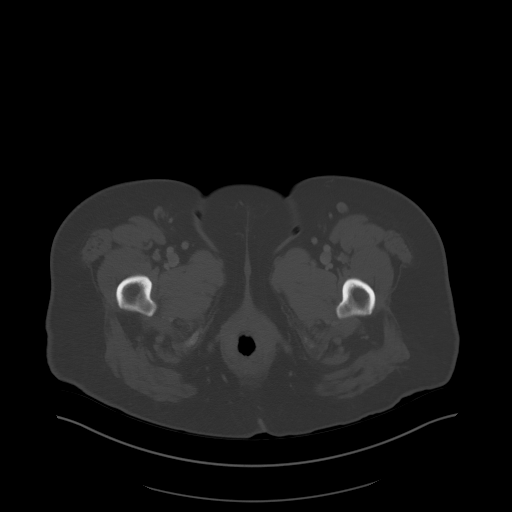
[im 14/94  soft-tissue]
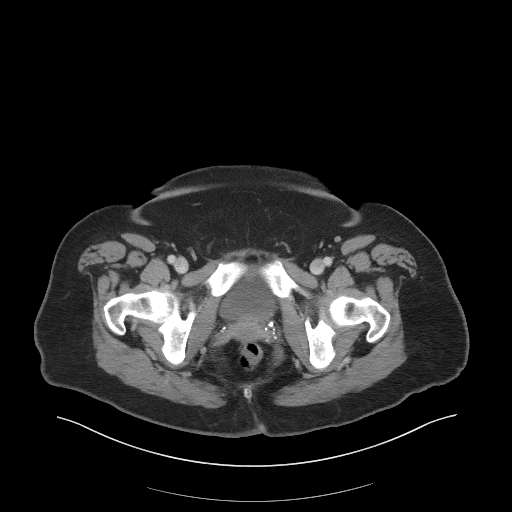
[im 19/94  soft-tissue]
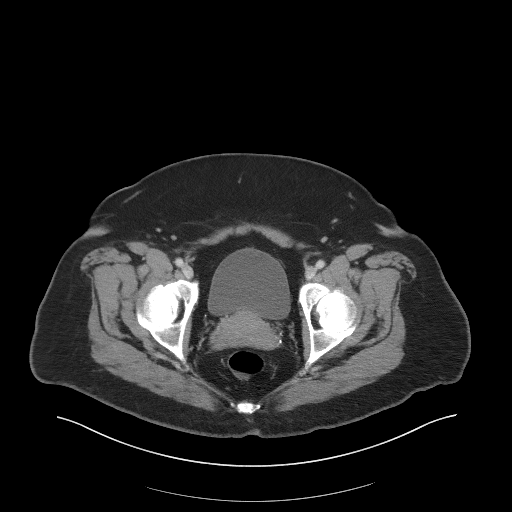
[im 28/94  soft-tissue]
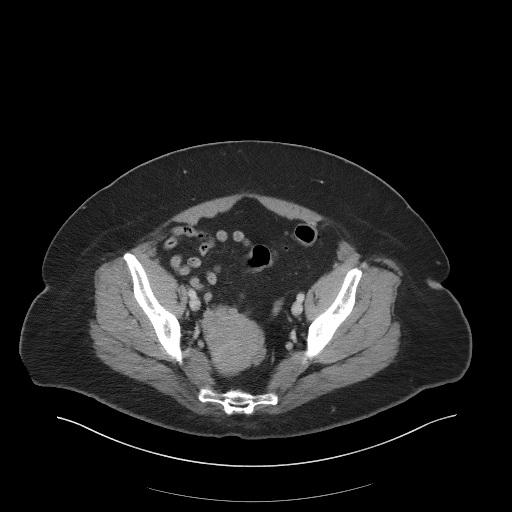
[im 33/94  soft-tissue]
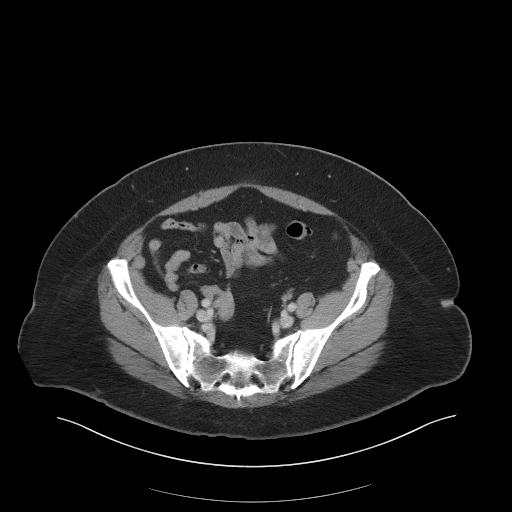
[im 42/94  soft-tissue]
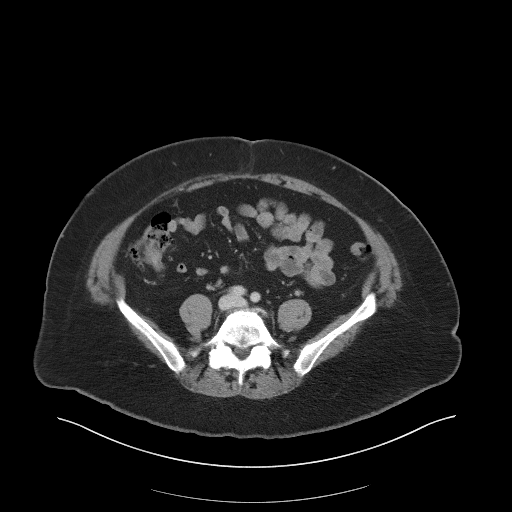
[im 47/94  soft-tissue]
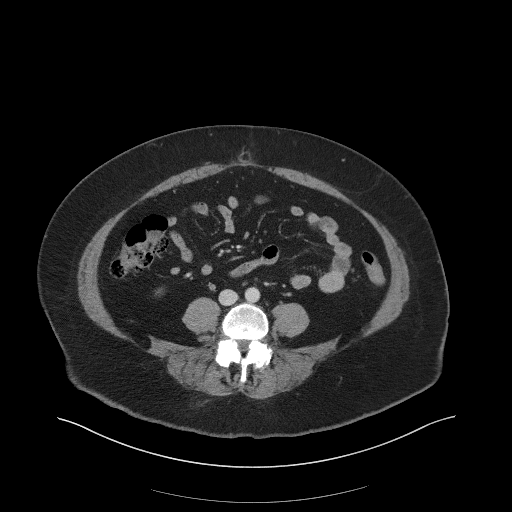
[im 52/94  soft-tissue]
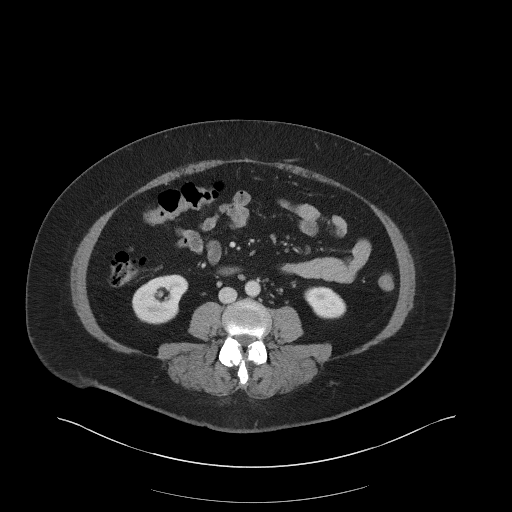
[im 61/94  soft-tissue]
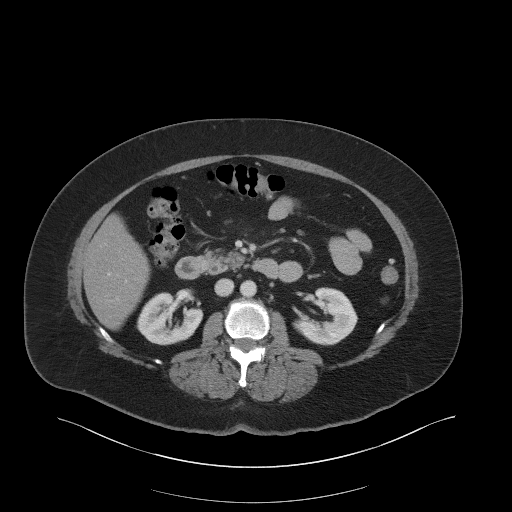
[im 61/94  bone]
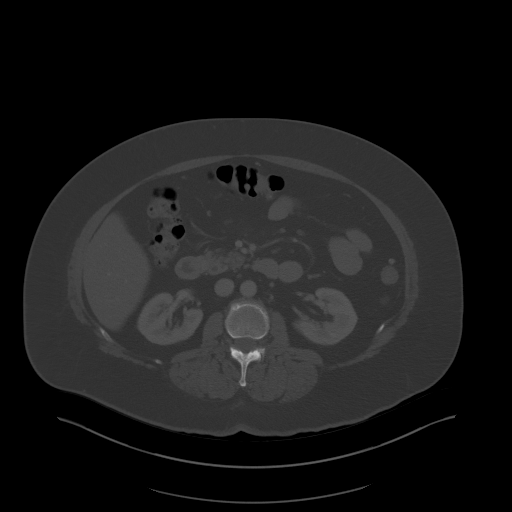
[im 66/94  soft-tissue]
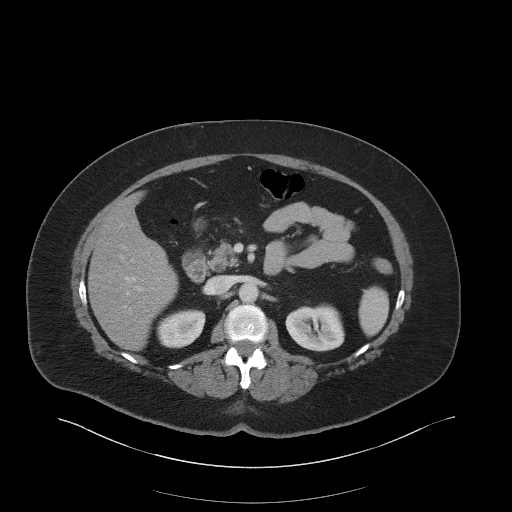
[im 75/94  soft-tissue]
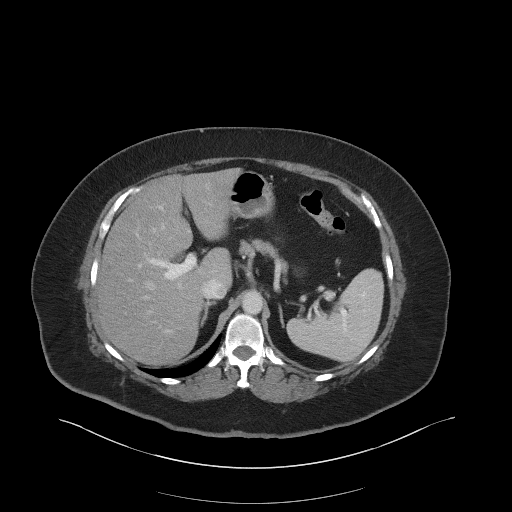
[im 80/94  soft-tissue]
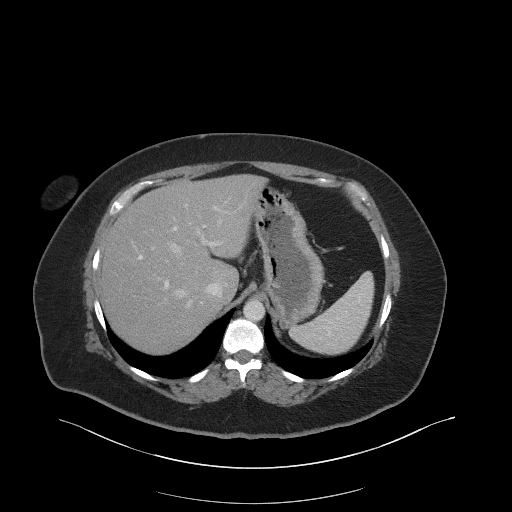
[im 89/94  soft-tissue]
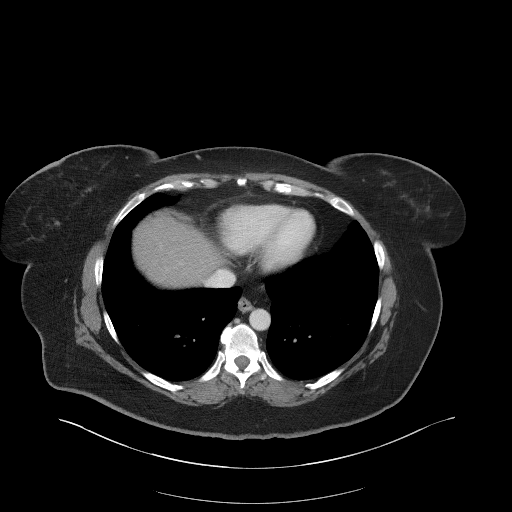

[Series 5: coronal st · coronal · 0.87mm/px · 3 of 113 slices shown]
[im 38/113  soft-tissue]
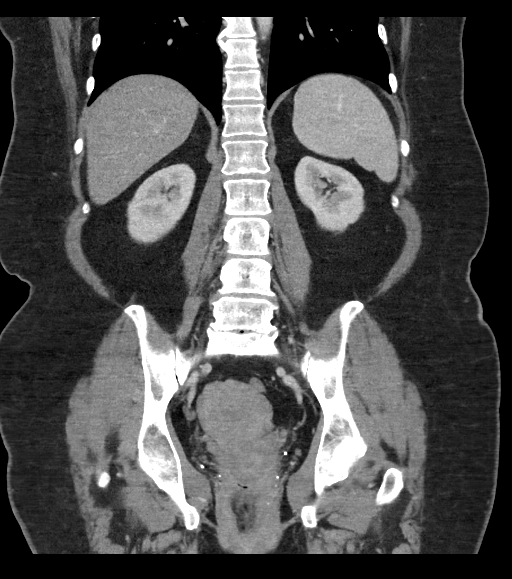
[im 50/113  soft-tissue]
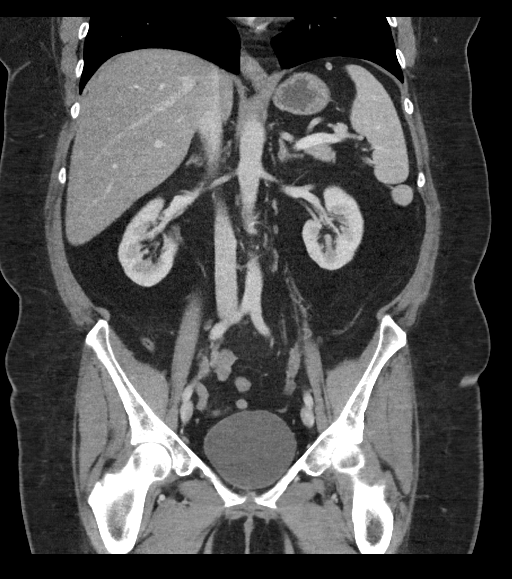
[im 63/113  soft-tissue]
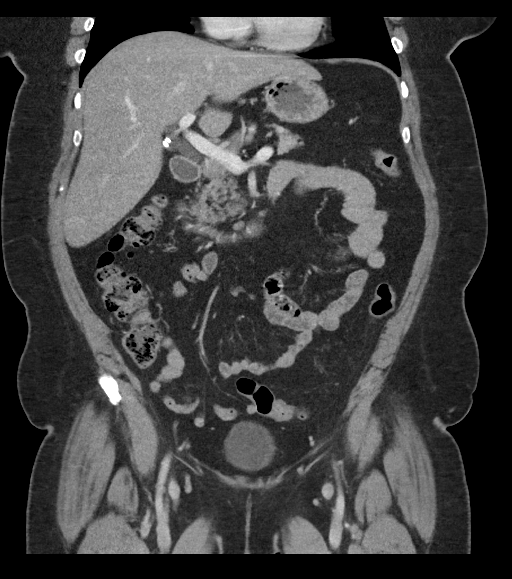

[16 of 46 positions shown; findings below may reference images not displayed]

FINDINGS: Lower chest: The visualized lung bases are clear bilaterally. The
visualized heart and pericardium are unremarkable.

Hepatobiliary: Moderate hepatic steatosis. No focal enhancing
intrahepatic mass. No intra or extrahepatic biliary ductal dilation.
Cholecystectomy has been performed.

Pancreas: Unremarkable

Spleen: Unremarkable

Adrenals/Urinary Tract: Adrenal glands are unremarkable. Kidneys are
normal, without renal calculi, focal lesion, or hydronephrosis.
Bladder is unremarkable.

Stomach/Bowel: Mild sigmoid colonic diverticulosis. The stomach,
small bowel, and large bowel are otherwise unremarkable. Appendix
normal. No free intraperitoneal gas or fluid.

Vascular/Lymphatic: No significant vascular findings are present. No
enlarged abdominal or pelvic lymph nodes.

Reproductive: Lobulated appearance of the uterus likely relates to
underlying uterine fibroids. The pelvic organs are otherwise
unremarkable.

Other: Tiny fat containing umbilical hernia. The rectum is
unremarkable.

Musculoskeletal: No acute bone abnormality. No lytic or blastic bone
lesions are identified. Degenerative changes are seen within the
lumbar spine.
IMPRESSION: No acute intra-abdominal pathology identified. No definite
radiographic explanation for the patient's reported symptoms.

Moderate hepatic steatosis.

Mild sigmoid diverticulosis.

## 2021-02-04 IMAGING — DX DG CHEST 1V PORT
1 series · 1 of 1 positions shown · non-contrast
Comparison: Radiograph [DATE]

CLINICAL DATA: Abdominal pain and chills. Upper endoscopy and
colonoscopy today. Question free air.

EXAM:
PORTABLE CHEST 1 VIEW

[chest ap]
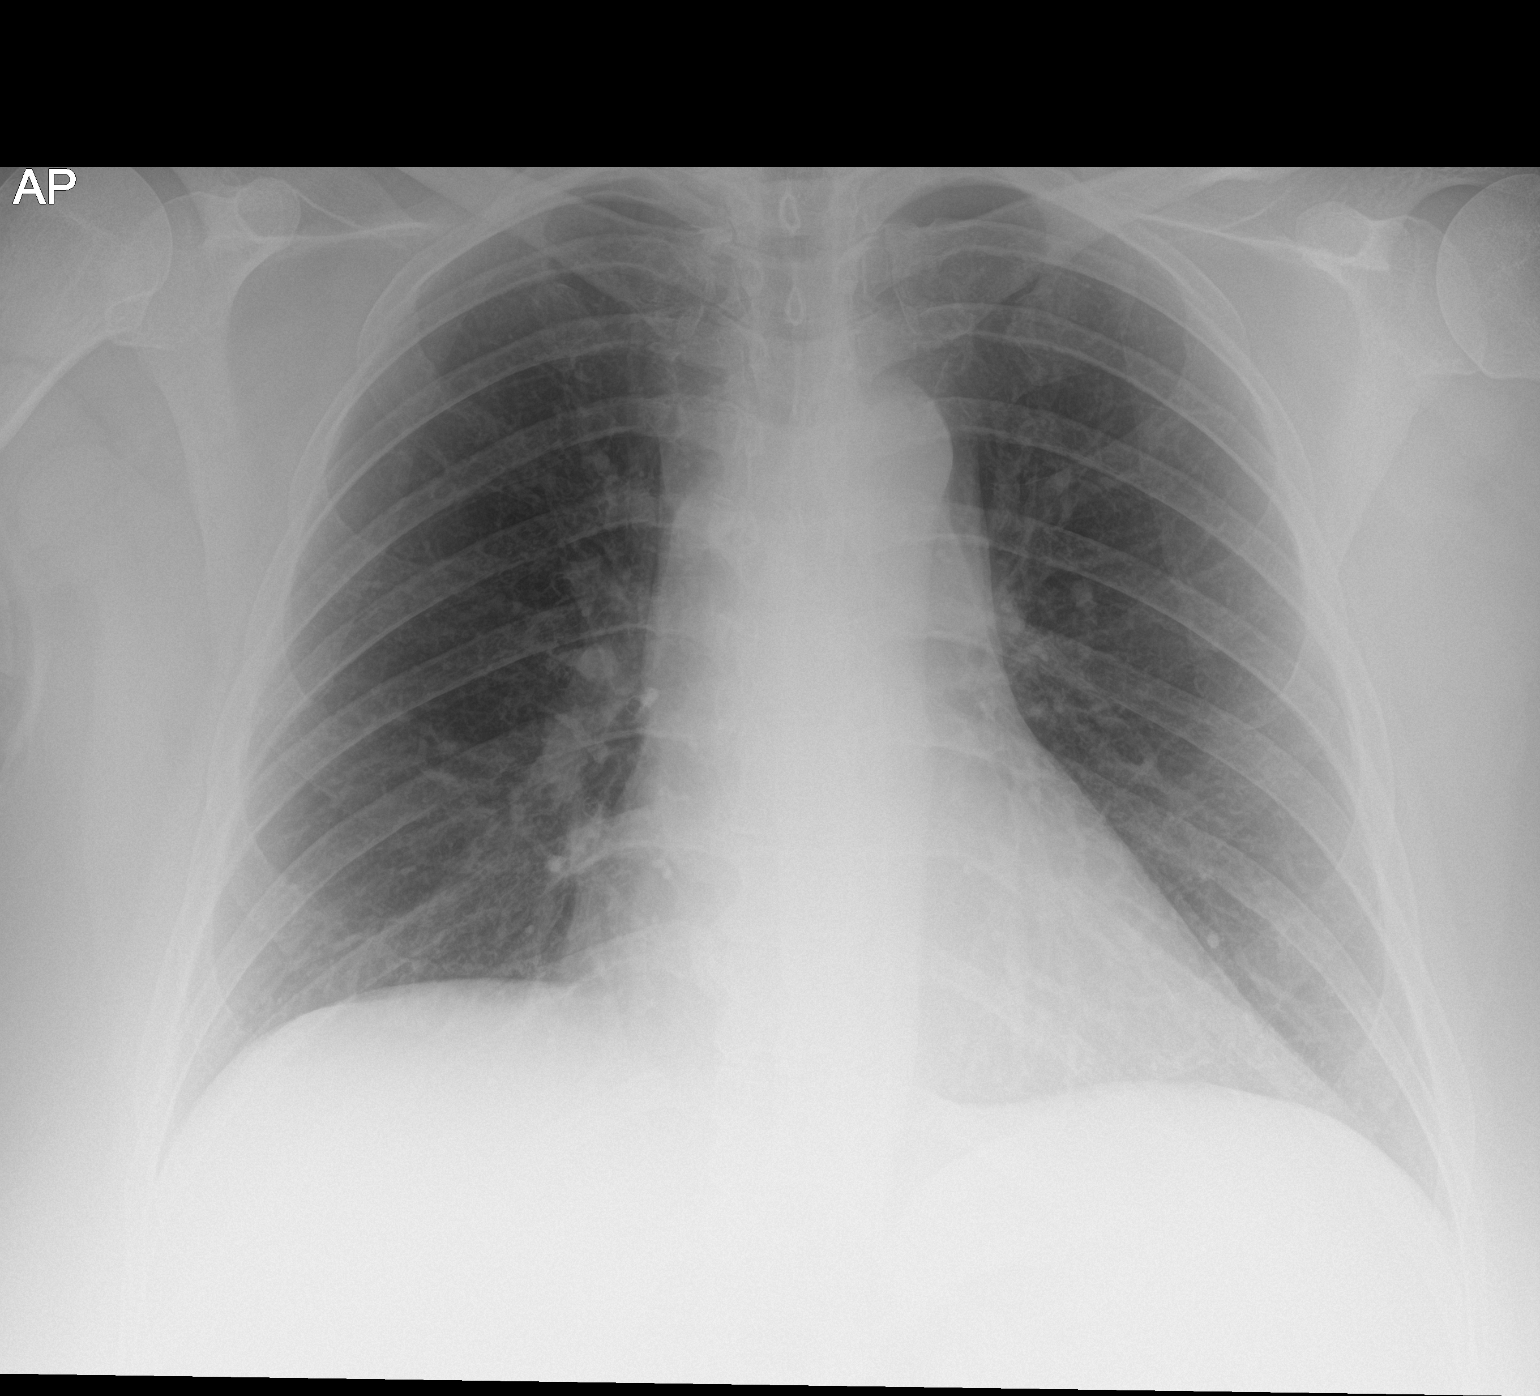

[1 of 1 positions shown; findings below may reference images not displayed]

FINDINGS: No visualized pneumomediastinum.The cardiomediastinal contours are
normal. The lungs are clear. Pulmonary vasculature is normal. No
consolidation, pleural effusion, or pneumothorax. No acute osseous
abnormalities are seen. No visualized free air under the diaphragms.
IMPRESSION: No visualized pneumomediastinum or free air under the diaphragms. No
acute process in the chest.

## 2021-02-04 MED ORDER — IOHEXOL 300 MG/ML  SOLN
100.0000 mL | Freq: Once | INTRAMUSCULAR | Status: AC | PRN
  Administered 2021-02-04: 100 mL via INTRAVENOUS

## 2021-02-04 MED ORDER — FENTANYL CITRATE (PF) 100 MCG/2ML IJ SOLN
50.0000 ug | Freq: Once | INTRAMUSCULAR | Status: AC
  Administered 2021-02-04: 50 ug via INTRAVENOUS
  Filled 2021-02-04: qty 2

## 2021-02-04 NOTE — ED Triage Notes (Signed)
Pt c/o abd pain, chills-states she had upper endoscopy and colonoscopy today with "stomach biopsies" and "2 polyps in my intestines and 2 in my esophagus"-states she called GI and was advised to come to ED-NAD-steady gait

## 2021-02-04 NOTE — ED Provider Notes (Signed)
[facility] Provider Note   CSN: [id number] Arrival date & time: 02/04/21  1554     History Chief Complaint  Patient presents with   Abdominal Pain    Casey Jordan is a 55 y.o. female.  The history is provided by the patient.  Abdominal Pain Pain location:  Epigastric Pain quality: aching   Pain radiates to:  Does not radiate Pain severity:  Mild Onset quality:  Gradual Duration:  6 hours Timing:  Constant Progression:  Unchanged Chronicity:  New Context comment:  Patient had EGD and colonoscopy today, having pain in the epigastric region ever since procedure.  Denies any nausea or vomiting.  Has not passed much gas.  Denies any blood in her stool.  Had 2 polyps removed from her stomach. Relieved by:  Nothing Worsened by:  Nothing Associated symptoms: no anorexia, no belching, no chest pain, no chills, no constipation, no cough, no diarrhea, no dysuria, no fatigue, no fever, no flatus, no hematemesis, no hematochezia, no hematuria, no melena, no nausea, no shortness of breath, no sore throat and no vomiting       Past Medical History:  Diagnosis Date   Anemia    Asthma    Cardiac murmur 04/30/2020   Depression    Dizziness 07/05/2018   Formatting of this note might be different from the original. Onset December 2019.  MRI of the brain okay.  Last Assessment & Plan:  Formatting of this note might be different from the original. Complex disease history.  Onset back in December.  Since then she has experienced syncopal episodes on at least 2 occasions.  Frequent spinning episodes and almost constant unsteadiness that is improved wi   DVT (deep venous thrombosis) (HCC)    lower leg while traveling went to lung   GAD (generalized anxiety disorder) 08/06/2017   Generalized anxiety disorder 08/06/2017   GERD (gastroesophageal reflux disease)    Headache    Migraines   History of deep venous thrombosis (DVT) of distal vein of right lower  extremity 12/21/2016   Formatting of this note might be different from the original. Anticoagulation x 6 months; no recurrence.  Dx 2010 Formatting of this note might be different from the original. Overview:  Anticoagulation x 6 months; no recurrence.  Dx 2010   History of fusion of cervical spine 12/21/2016   Formatting of this note might be different from the original. 2011-2012 Formatting of this note might be different from the original. Overview:  2011-2012   History of kidney stones    Left ureteral calculus 11/18/2017   Major depressive disorder, recurrent episode, moderate ([place]) 12/28/2019   Major depressive disorder, single episode, severe with psychotic features ([place]) 06/18/2020   Mild intermittent asthma without complication 9/98/3382   Mixed hyperlipidemia 06/29/2017   Formatting of this note might be different from the original. 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 06/2018: Noted on [place] RUE   Vaginal Pap smear, abnormal    Vitamin D insufficiency     Patient Active Problem List   Diagnosis Date Noted   Bipolar 2 disorder, major  depressive episode ([place]) 11/28/2020   Spondylolysis of cervical spine 10/01/2020   Posttraumatic stress disorder 06/18/2020   Major depressive disorder, single episode, severe with psychotic features ([place]) 06/18/2020   Preop cardiovascular exam 04/30/2020   Morbid obesity ([place]) 04/30/2020   Cardiac murmur 04/30/2020   Anemia    Asthma    Depression    DVT (deep venous thrombosis) (HCC)    Headache    History of kidney stones    Vaginal Pap smear, abnormal     Vitamin D insufficiency    Major depressive disorder, recurrent episode, moderate ([place]) 12/28/2019   Post-traumatic stress disorder, unspecified 12/21/2018   Thrombophlebitis arm 07/06/2018   Dizziness 07/05/2018   Nausea and vomiting 06/25/2018   Left ureteral calculus 11/18/2017   Generalized anxiety disorder 08/06/2017   Mild intermittent asthma without complication 48/18/5631   GAD (generalized anxiety disorder) 08/06/2017   Mixed hyperlipidemia 06/29/2017   GERD (gastroesophageal reflux disease) 05/28/2017   History of deep venous thrombosis (DVT) of distal vein of right lower extremity 12/21/2016   History of fusion of cervical spine 12/21/2016   Obesity (BMI 30-39.9) 12/21/2016   Restless leg syndrome 12/21/2016    Past Surgical History:  Procedure Laterality Date   CHOLECYSTECTOMY     COLONOSCOPY     COLPOSCOPY     HYSTEROSCOPY WITH D & C N/A 04/02/2017   Procedure: DILATATION AND CURETTAGE /HYSTEROSCOPY;  Surgeon: Linda Hedges, DO;  Location: [facility];  Service: Gynecology;  Laterality: N/A;   KIDNEY STONE SURGERY     OVARY SURGERY     POSTERIOR FUSION CERVICAL SPINE     TONSILLECTOMY       OB History     Gravida  3   Para  3   Term  3   Preterm      AB      Living  3      SAB      IAB      Ectopic      Multiple      Live Births              Family History  Problem Relation Age of Onset   Kidney disease Mother    COPD Mother    Alcohol abuse Mother    Diabetes Mother    Hyperlipidemia Father    Hypertension Father    Hearing loss Father    Alcohol abuse Father    Diabetes Father    Heart disease Father    Kidney disease Father    COPD Sister    Hearing loss Brother    Birth defects Brother    Cancer Brother    Alcohol abuse Brother    Depression Son    Alcohol abuse Son    Alcohol abuse Maternal Grandmother    Stroke Maternal Grandfather    Anxiety disorder Maternal Grandfather    Miscarriages / Stillbirths Paternal  Grandmother    Early death Paternal Grandmother    Diabetes Paternal Grandfather     Social History   Tobacco Use   Smoking status: Never   Smokeless tobacco: Never  Vaping Use   Vaping Use: Never used  Substance Use Topics   Alcohol use: Not Currently   Drug use: No    Home Medications Prior to Admission medications   Medication Sig Start Date End Date Taking? Authorizing Provider  albuterol (VENTOLIN HFA) 108 (90 Base) MCG/ACT inhaler Inhale 2 puffs into the lungs every 6 (six) hours as needed  for wheezing or shortness of breath. 03/06/20   Argentina Donovan, PA-C  famotidine (PEPCID) 20 MG tablet Take 1 tablet (20 mg total) by mouth 2 (two) times daily. Needs OV for additional refills. 01/28/21   Charlott Rakes, MD  gabapentin (NEURONTIN) 300 MG capsule Take 1 capsule (300 mg total) by mouth 3 (three) times daily. 12/30/20 03/30/21  Salley Slaughter, NP  hydrOXYzine (ATARAX/VISTARIL) 50 MG tablet Take 1 tablet (50 mg total) by mouth 3 (three) times daily as needed. 12/30/20   Salley Slaughter, NP  ipratropium-albuterol (DUONEB) 0.5-2.5 (3) MG/3ML SOLN 1 nebule every 4 to 6 hrs prn wheezing 03/06/20   Argentina Donovan, PA-C  lamoTRIgine (LAMICTAL) 25 MG tablet Take 1 tablet (25 mg total) by mouth daily. 12/30/20   Salley Slaughter, NP  LORazepam (ATIVAN) 0.5 MG tablet Take 1 tablet (0.5 mg total) by mouth 2 (two) times daily. 12/30/20 03/30/21  Salley Slaughter, NP  meclizine (ANTIVERT) 25 MG tablet Take 1 tablet (25 mg total) by mouth 3 (three) times daily as needed for dizziness. 01/28/21   Charlott Rakes, MD  Multiple Vitamin (MULTIVITAMIN WITH MINERALS) TABS tablet Take 1 tablet by mouth daily.    [provider]  omeprazole (PRILOSEC) 40 MG capsule Take 1 capsule (40 mg total) by mouth 2 (two) times daily. 01/28/21   Charlott Rakes, MD  pantoprazole (PROTONIX) 40 MG tablet Take 1 tablet (40 mg total) by mouth 2 (two) times daily before a meal. 01/10/21   Marrian Salvage, FNP  PARoxetine (PAXIL) 40 MG tablet Take 1 tablet (40 mg total) by mouth daily. 12/30/20   Salley Slaughter, NP  promethazine (PHENERGAN) 12.5 MG tablet Take 1 tablet (12.5 mg total) by mouth every 8 (eight) hours as needed for nausea or vomiting. 08/09/19   Fulp, Cammie, MD  QUEtiapine (SEROQUEL) 300 MG tablet Take 1 tablet (300 mg total) by mouth at bedtime. 12/30/20 03/30/21  Salley Slaughter, NP  traZODone (DESYREL) 150 MG tablet Take 1 tablet (150 mg total) by mouth at bedtime. 12/30/20   Salley Slaughter, NP    Allergies    Codeine, Naproxen, Avocado, Sulfa antibiotics, Sulfamethoxazole, Sulfasalazine, Fluconazole, Nickel, Ondansetron, and Penicillins  Review of Systems   Review of Systems  Constitutional:  Negative for chills, fatigue and fever.  HENT:  Negative for ear pain and sore throat.   Eyes:  Negative for pain and visual disturbance.  Respiratory:  Negative for cough and shortness of breath.   Cardiovascular:  Negative for chest pain and palpitations.  Gastrointestinal:  Positive for abdominal pain. Negative for anorexia, constipation, diarrhea, flatus, hematemesis, hematochezia, melena, nausea and vomiting.  Genitourinary:  Negative for dysuria and hematuria.  Musculoskeletal:  Negative for arthralgias and back pain.  Skin:  Negative for color change and rash.  Neurological:  Negative for seizures and syncope.  All other systems reviewed and are negative.  Physical Exam Updated Vital Signs BP (!) 123/96 (BP Location: Right Arm)   Pulse 89   Temp 98.4 F (36.9 C) (Oral)   Resp 16   Ht 5\' 5"  (1.651 m)   Wt 102.1 kg   SpO2 100%   BMI 37.44 kg/m   Physical Exam Vitals and nursing note reviewed.  Constitutional:      General: She is not in acute distress.    Appearance: She is well-developed. She is not ill-appearing.  HENT:     Head: Normocephalic and atraumatic.     Mouth/Throat:  Mouth: Mucous membranes are moist.  Eyes:      Extraocular Movements: Extraocular movements intact.     Conjunctiva/sclera: Conjunctivae normal.     Pupils: Pupils are equal, round, and reactive to light.  Cardiovascular:     Rate and Rhythm: Normal rate and regular rhythm.     Pulses: Normal pulses.     Heart sounds: Normal heart sounds. No murmur heard. Pulmonary:     Effort: Pulmonary effort is normal. No respiratory distress.     Breath sounds: Normal breath sounds.  Abdominal:     Palpations: Abdomen is soft.     Tenderness: There is abdominal tenderness in the epigastric area.  Musculoskeletal:     Cervical back: Neck supple.  Skin:    General: Skin is warm and dry.     Capillary Refill: Capillary refill takes less than 2 seconds.  Neurological:     General: No focal deficit present.     Mental Status: She is alert.    ED Results / Procedures / Treatments   Labs (all labs ordered are listed, but only abnormal results are displayed) Labs Reviewed  COMPREHENSIVE METABOLIC PANEL - Abnormal; Notable for the following components:      Result Value   Glucose, Bld 116 (*)    AST 79 (*)    ALT 61 (*)    All other components within normal limits  CBC WITH DIFFERENTIAL/PLATELET  LIPASE, BLOOD    EKG None  Radiology CT ABDOMEN PELVIS W CONTRAST  Result Date: 02/04/2021 CLINICAL DATA:  Epigastric abdominal pain following upper and lower endoscopy EXAM: CT ABDOMEN AND PELVIS WITH CONTRAST TECHNIQUE: Multidetector CT imaging of the abdomen and pelvis was performed using the standard protocol following bolus administration of intravenous contrast. CONTRAST:  159mL OMNIPAQUE IOHEXOL 300 MG/ML  SOLN COMPARISON:  11/16/2017 FINDINGS: Lower chest: The visualized lung bases are clear bilaterally. The visualized heart and pericardium are unremarkable. Hepatobiliary: Moderate hepatic steatosis. No focal enhancing intrahepatic mass. No intra or extrahepatic biliary ductal dilation. Cholecystectomy has been performed. Pancreas:  Unremarkable Spleen: Unremarkable Adrenals/Urinary Tract: Adrenal glands are unremarkable. Kidneys are normal, without renal calculi, focal lesion, or hydronephrosis. Bladder is unremarkable. Stomach/Bowel: Mild sigmoid colonic diverticulosis. The stomach, small bowel, and large bowel are otherwise unremarkable. Appendix normal. No free intraperitoneal gas or fluid. Vascular/Lymphatic: No significant vascular findings are present. No enlarged abdominal or pelvic lymph nodes. Reproductive: Lobulated appearance of the uterus likely relates to underlying uterine fibroids. The pelvic organs are otherwise unremarkable. Other: Tiny fat containing umbilical hernia. The rectum is unremarkable. Musculoskeletal: No acute bone abnormality. No lytic or blastic bone lesions are identified. Degenerative changes are seen within the lumbar spine. IMPRESSION: No acute intra-abdominal pathology identified. No definite radiographic explanation for the patient's reported symptoms. Moderate hepatic steatosis. Mild sigmoid diverticulosis. Electronically Signed   By: Fidela Salisbury MD   On: 02/04/2021 18:53   DG Chest Portable 1 View  Result Date: 02/04/2021 CLINICAL DATA:  Abdominal pain and chills. Upper endoscopy and colonoscopy today. Question free air. EXAM: PORTABLE CHEST 1 VIEW COMPARISON:  Radiograph 03/25/2020 FINDINGS: No visualized pneumomediastinum.The cardiomediastinal contours are normal. The lungs are clear. Pulmonary vasculature is normal. No consolidation, pleural effusion, or pneumothorax. No acute osseous abnormalities are seen. No visualized free air under the diaphragms. IMPRESSION: No visualized pneumomediastinum or free air under the diaphragms. No acute process in the chest. Electronically Signed   By: Keith Rake M.D.   On: 02/04/2021 17:28    Procedures Procedures  Medications Ordered in ED Medications  fentaNYL (SUBLIMAZE) injection 50 mcg (50 mcg Intravenous Given 02/04/21 1741)  iohexol  (OMNIPAQUE) 300 MG/ML solution 100 mL (100 mLs Intravenous Contrast Given 02/04/21 1756)    ED Course  I have reviewed the triage vital signs and the nursing notes.  Pertinent labs & imaging results that were available during my care of the patient were reviewed by me and considered in my medical decision making (see chart for details).    MDM Rules/Calculators/A&P                           Casey Jordan is here with abdominal pain after EGD and colonoscopy today.  She had some biopsies done of her stomach and some polyps removed from her esophagus and colon.  Pain is mostly in the epigastric region.  Normal vitals.  No fever.  She has mostly discomfort in epigastric region.  Has not had any nausea or vomiting.  Has not had a bowel movement.  Will get CT scan to evaluate for any type of perforation or complication from procedures today.  This could be postprocedural discomfort as well.  She has not had any bleeding.  Lab work thus far shows no significant anemia, electrolyte abnormality, kidney injury.  Chest x-ray and CT scan show no perforation, no injuries.  Overall suspect pain is secondary to local inflammation or air that may be still there from procedures.  There does not appear to be any iatrogenic injury on imaging.  Given reassurance and discharged from the ED in good condition.  This chart was dictated using voice recognition software.  Despite best efforts to proofread,  errors can occur which can change the documentation meaning.   Final Clinical Impression(s) / ED Diagnoses Final diagnoses:  Epigastric pain    Rx / DC Orders ED Discharge Orders     None        Lennice Sites, DO 02/04/21 1921

## 2021-02-04 NOTE — ED Notes (Signed)
Pt ambulatory with standby assist to bathroom, will provide urine specimen. Pt inquiring on how much longer, has family waiting in car.

## 2021-02-04 NOTE — ED Notes (Signed)
Patient transported to CT 

## 2021-02-05 ENCOUNTER — Telehealth (HOSPITAL_COMMUNITY): Payer: Medicare HMO | Admitting: Psychiatry

## 2021-02-11 ENCOUNTER — Other Ambulatory Visit: Payer: Self-pay | Admitting: Family Medicine

## 2021-02-11 DIAGNOSIS — K219 Gastro-esophageal reflux disease without esophagitis: Secondary | ICD-10-CM

## 2021-02-11 NOTE — Telephone Encounter (Signed)
Requested medications are due for refill today.  A bit early  Requested medications are on the active medications list.  yes  Last refill. 01/28/2021  Future visit scheduled.  no  Notes to clinic.  Courtesy refill already given. No PCP listed.

## 2021-02-26 ENCOUNTER — Other Ambulatory Visit: Payer: Self-pay | Admitting: *Deleted

## 2021-02-26 ENCOUNTER — Telehealth: Payer: Self-pay

## 2021-02-26 NOTE — Telephone Encounter (Signed)
I have called to see if she was taking the Protonix and if so how is she doing on it. It also looks like she is on Omeprazole and Pepcid as well. I wanted to confirm her medications to see what she is actually on. I got a paper refill request from the pharmacy.   No answer and I was unable to leave voicemail since mailbox was full.   I have called the pharmacy and she is on Bentyl as well. They will take off the Pantoprazole for now unless the pt asks for it.

## 2021-03-05 ENCOUNTER — Emergency Department (HOSPITAL_BASED_OUTPATIENT_CLINIC_OR_DEPARTMENT_OTHER): Payer: Medicare HMO

## 2021-03-05 ENCOUNTER — Emergency Department (HOSPITAL_BASED_OUTPATIENT_CLINIC_OR_DEPARTMENT_OTHER)
Admission: EM | Admit: 2021-03-05 | Discharge: 2021-03-05 | Disposition: A | Discharge status: Home / Self Care | Payer: Medicare HMO | Attending: Emergency Medicine | Admitting: Emergency Medicine

## 2021-03-05 ENCOUNTER — Other Ambulatory Visit: Payer: Self-pay

## 2021-03-05 ENCOUNTER — Encounter (HOSPITAL_BASED_OUTPATIENT_CLINIC_OR_DEPARTMENT_OTHER): Payer: Self-pay

## 2021-03-05 DIAGNOSIS — R1011 Right upper quadrant pain: Secondary | ICD-10-CM | POA: Diagnosis not present

## 2021-03-05 DIAGNOSIS — J45909 Unspecified asthma, uncomplicated: Secondary | ICD-10-CM | POA: Insufficient documentation

## 2021-03-05 DIAGNOSIS — R112 Nausea with vomiting, unspecified: Secondary | ICD-10-CM | POA: Diagnosis not present

## 2021-03-05 DIAGNOSIS — R197 Diarrhea, unspecified: Secondary | ICD-10-CM | POA: Diagnosis not present

## 2021-03-05 DIAGNOSIS — Z7951 Long term (current) use of inhaled steroids: Secondary | ICD-10-CM | POA: Insufficient documentation

## 2021-03-05 DIAGNOSIS — K219 Gastro-esophageal reflux disease without esophagitis: Secondary | ICD-10-CM | POA: Diagnosis not present

## 2021-03-05 LAB — COMPREHENSIVE METABOLIC PANEL
ALT: 53 U/L — ABNORMAL HIGH (ref 0–44)
AST: 59 U/L — ABNORMAL HIGH (ref 15–41)
Albumin: 4.1 g/dL (ref 3.5–5.0)
Alkaline Phosphatase: 94 U/L (ref 38–126)
Anion gap: 11 (ref 5–15)
BUN: 14 mg/dL (ref 6–20)
CO2: 22 mmol/L (ref 22–32)
Calcium: 9.4 mg/dL (ref 8.9–10.3)
Chloride: 103 mmol/L (ref 98–111)
Creatinine, Ser: 0.91 mg/dL (ref 0.44–1.00)
GFR, Estimated: >60 mL/min (ref >60)
Glucose, Bld: 194 mg/dL — ABNORMAL HIGH (ref 70–99)
Potassium: 3.3 mmol/L — ABNORMAL LOW (ref 3.5–5.1)
Sodium: 136 mmol/L (ref 135–145)
Total Bilirubin: 0.4 mg/dL (ref 0.3–1.2)
Total Protein: 7.3 g/dL (ref 6.5–8.1)

## 2021-03-05 LAB — URINALYSIS, ROUTINE W REFLEX MICROSCOPIC
Bilirubin Urine: NEGATIVE
Glucose, UA: NEGATIVE mg/dL
Hgb urine dipstick: NEGATIVE
Ketones, ur: NEGATIVE mg/dL
Leukocytes,Ua: NEGATIVE
Nitrite: NEGATIVE
Protein, ur: NEGATIVE mg/dL
Specific Gravity, Urine: <1.005 — ABNORMAL LOW (ref 1.005–1.030)
pH: 6 (ref 5.0–8.0)

## 2021-03-05 LAB — CBC
HCT: 41.7 % (ref 36.0–46.0)
Hemoglobin: 14.1 g/dL (ref 12.0–15.0)
MCH: 28.4 pg (ref 26.0–34.0)
MCHC: 33.8 g/dL (ref 30.0–36.0)
MCV: 83.9 fL (ref 80.0–100.0)
Platelets: 316 10*3/uL (ref 150–400)
RBC: 4.97 MIL/uL (ref 3.87–5.11)
RDW: 13.7 % (ref 11.5–15.5)
WBC: 8.4 10*3/uL (ref 4.0–10.5)
nRBC: 0 % (ref 0.0–0.2)

## 2021-03-05 LAB — LIPASE, BLOOD: Lipase: 31 U/L (ref 11–51)

## 2021-03-05 IMAGING — CT CT ABD-PELV W/ CM
2 of 5 series · 16 of 46 positions shown, 18 images · IV contrast (Omnipaque)
Comparison: [DATE]

CLINICAL DATA: Right upper quadrant abdominal pain, nausea and
diarrhea for 1-2 weeks.

EXAM:
CT ABDOMEN AND PELVIS WITH CONTRAST
TECHNIQUE: Multidetector CT imaging of the abdomen and pelvis was performed
using the standard protocol following bolus administration of
intravenous contrast.
CONTRAST:  100mL OMNIPAQUE IOHEXOL 300 MG/ML  SOLN

[Series 2: axial st · axial · 0.97mm/px · z∈[-496,-6]mm · 13 of 110 slices shown, 15 images]
[im 6/110  soft-tissue]
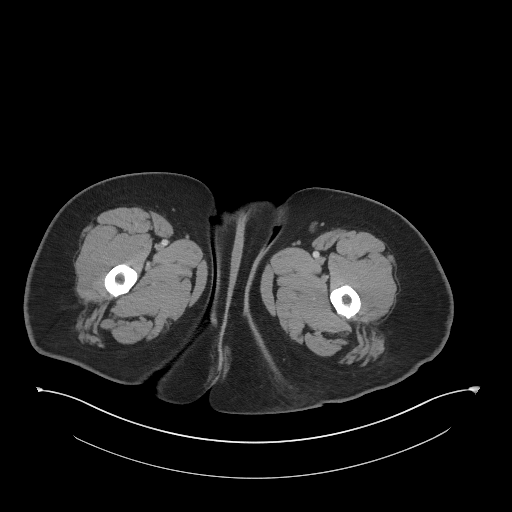
[im 6/110  bone]
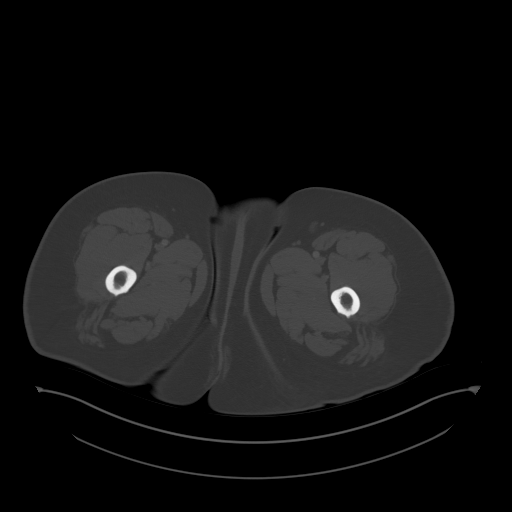
[im 18/110  soft-tissue]
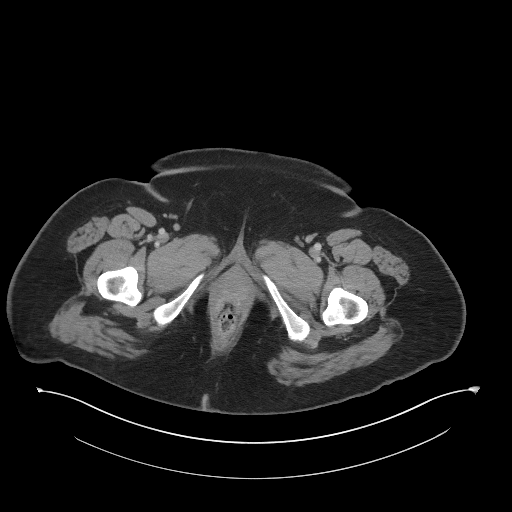
[im 23/110  soft-tissue]
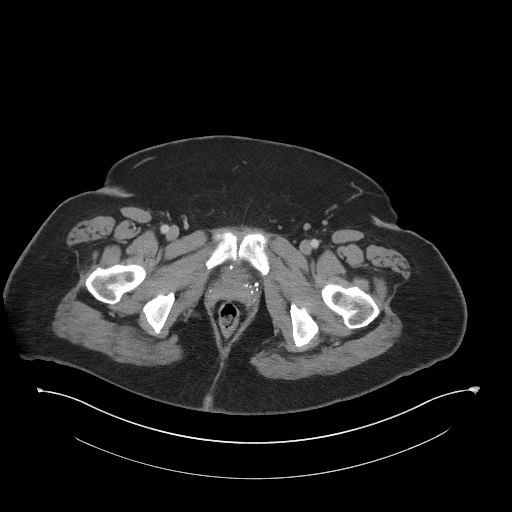
[im 29/110  soft-tissue]
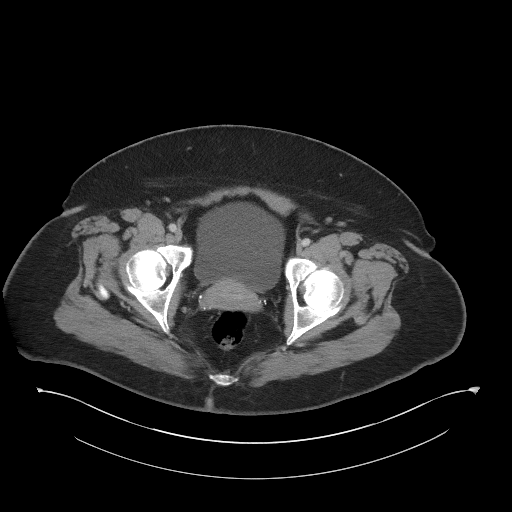
[im 41/110  soft-tissue]
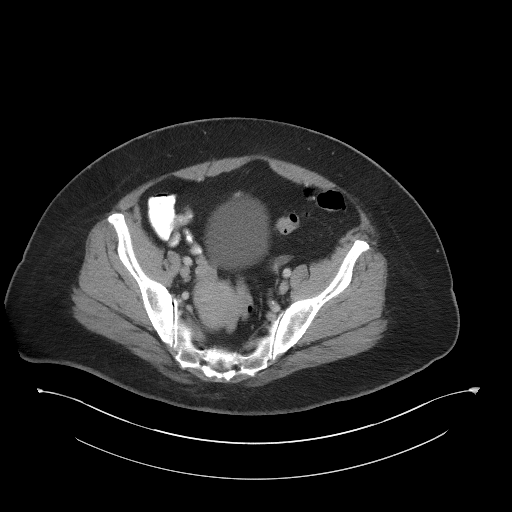
[im 46/110  soft-tissue]
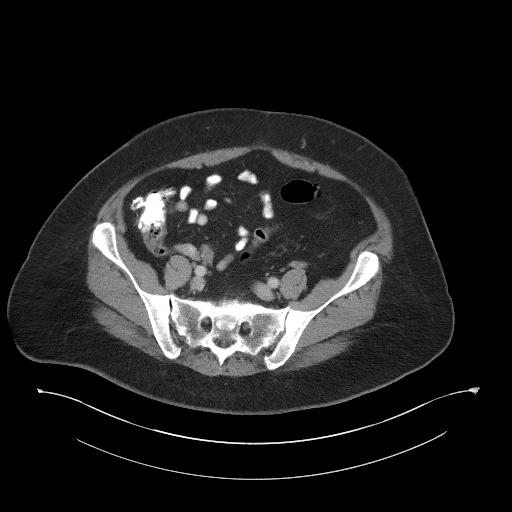
[im 58/110  soft-tissue]
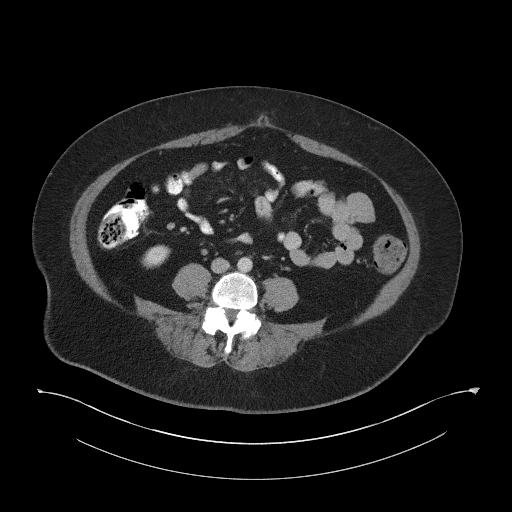
[im 64/110  soft-tissue]
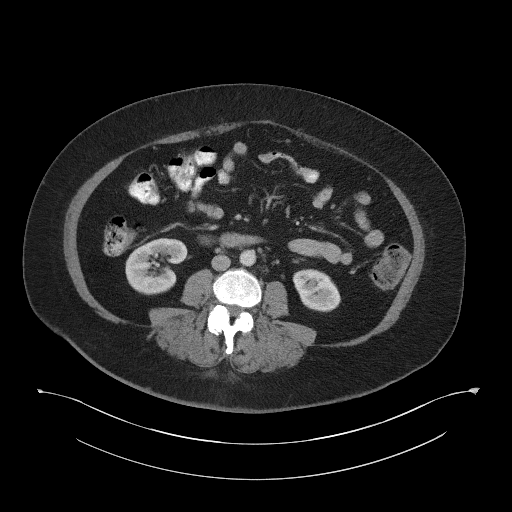
[im 69/110  soft-tissue]
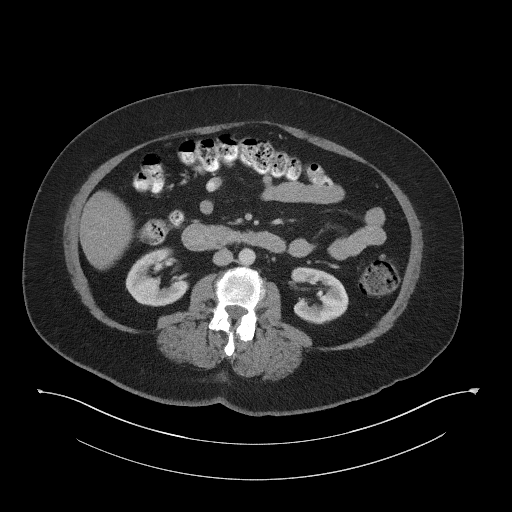
[im 69/110  bone]
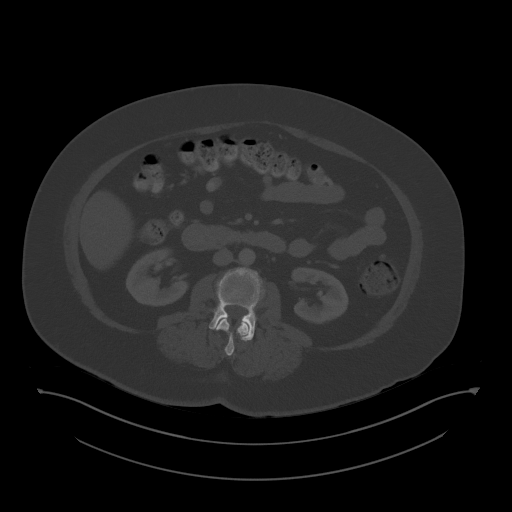
[im 81/110  soft-tissue]
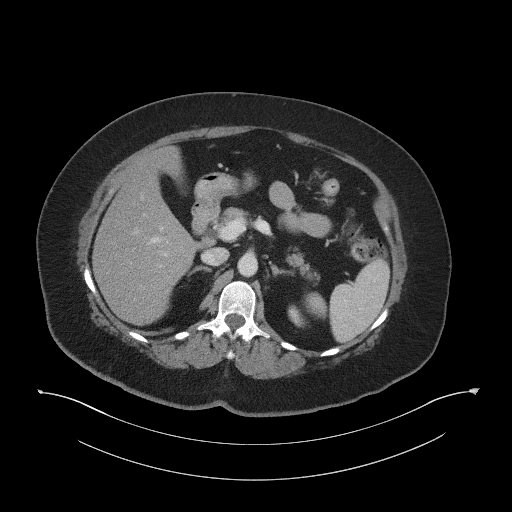
[im 87/110  soft-tissue]
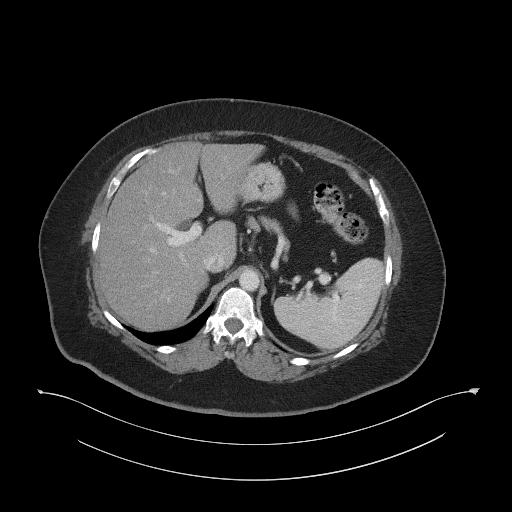
[im 92/110  soft-tissue]
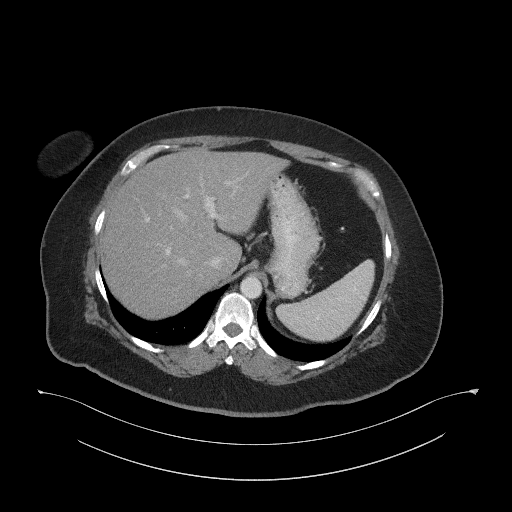
[im 104/110  soft-tissue]
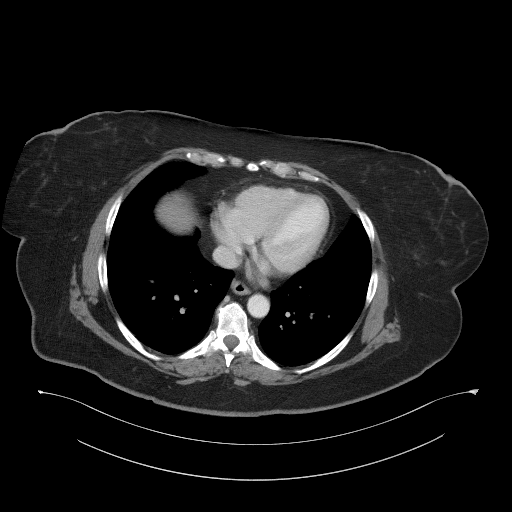

[Series 5: coronal st · coronal · 0.92mm/px · 3 of 109 slices shown]
[im 37/109  soft-tissue]
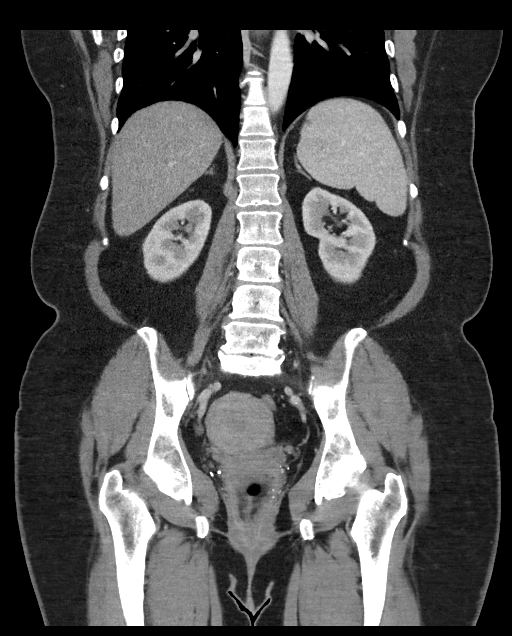
[im 49/109  soft-tissue]
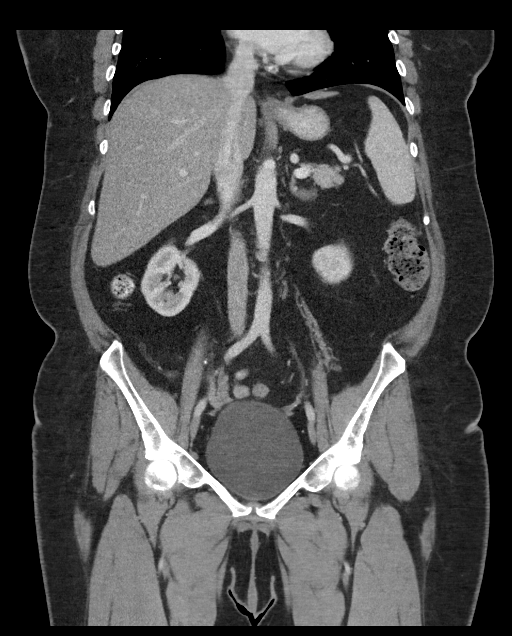
[im 61/109  soft-tissue]
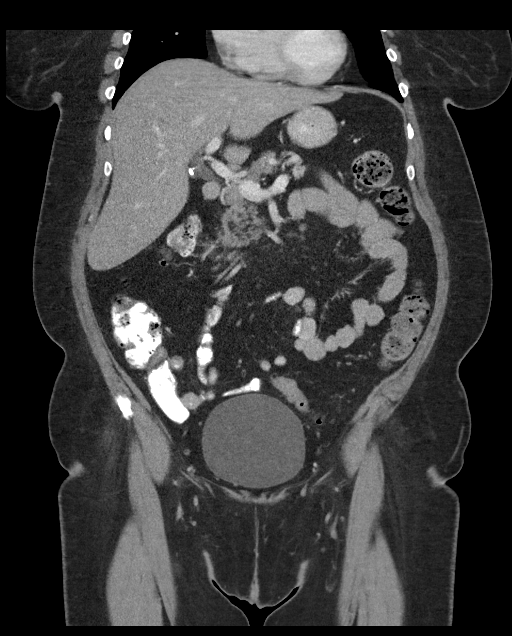

[16 of 46 positions shown; findings below may reference images not displayed]

FINDINGS: Lower chest: The lung bases are clear of acute process. No pleural
effusion or pulmonary lesions. The heart is normal in size. No
pericardial effusion. The distal esophagus and aorta are
unremarkable.

Hepatobiliary: No hepatic lesions or intrahepatic biliary
dilatation. Mild diffuse fatty infiltration of the liver. The
gallbladder is surgically absent. Mild associated common bile duct
dilatation, unchanged.

Pancreas: No mass, inflammation or ductal dilatation.

Spleen: Normal size.  No focal lesions.

Adrenals/Urinary Tract: Adrenal glands and kidneys are unremarkable.
No renal lesions, renal calculi or hydroureteronephrosis. The
bladder is unremarkable.

Stomach/Bowel: The stomach, duodenum, small bowel and are
unremarkable. No acute inflammatory changes, mass lesions or
obstructive findings. The terminal ileum is normal. The appendix is
normal.

Vascular/Lymphatic: The aorta is normal in caliber. No dissection.
The branch vessels are patent. The major venous structures are
patent. No mesenteric or retroperitoneal mass or adenopathy. Small
scattered lymph nodes are noted.

Reproductive: Retroverted uterus is noted. Stable 4 cm posterior
fundal fibroid. The ovaries are unremarkable.

Other: No pelvic mass or adenopathy. No free pelvic fluid
collections. No inguinal mass or adenopathy. No abdominal wall
hernia or subcutaneous lesions.

Musculoskeletal: No significant bony findings. Stable degenerative
changes involving the lower lumbar spine.
IMPRESSION: 1. No acute abdominal/pelvic findings, mass lesions or adenopathy.
2. Status post cholecystectomy with mild associated common bile duct
dilatation.
3. Mild diffuse fatty infiltration of the liver.
4. Stable 4 cm posterior fundal fibroid.

## 2021-03-05 IMAGING — US US ABDOMEN LIMITED
1 series · 14 of 25 positions shown · non-contrast
Comparison: CT abdomen pelvis [DATE]

CLINICAL DATA: Right upper quadrant pain

EXAM:
ULTRASOUND ABDOMEN LIMITED RIGHT UPPER QUADRANT

[Series 1: us abdomen limited · 14 of 25 slices shown]
[im 1/25]
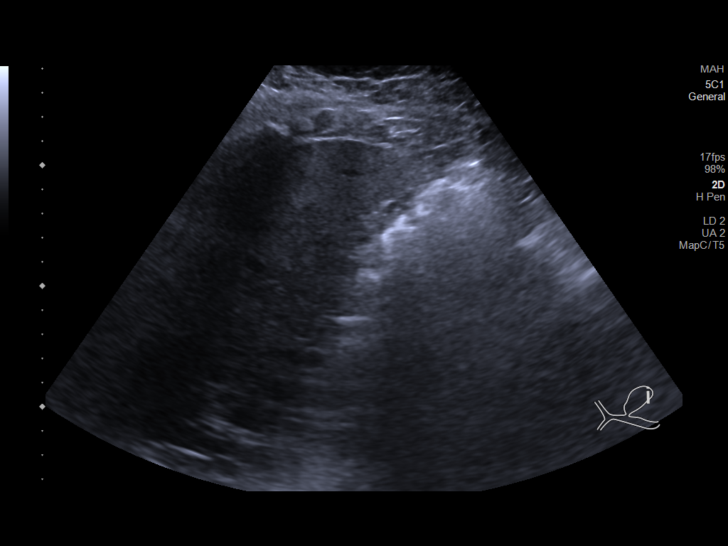
[im 3/25]
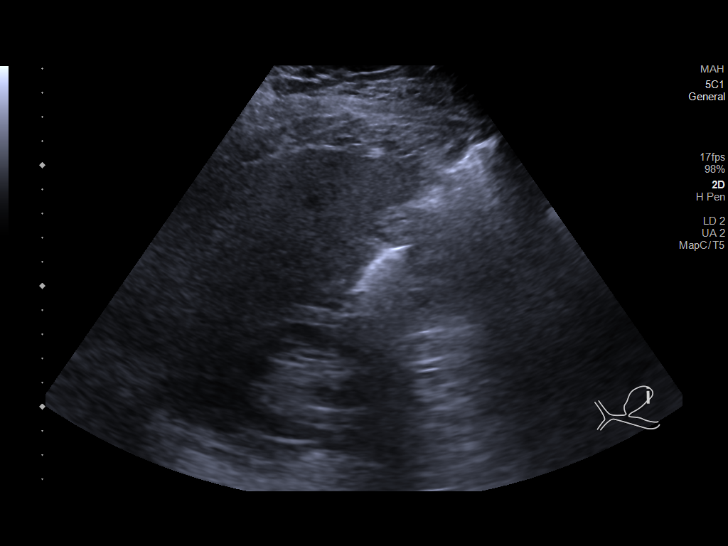
[im 5/25]
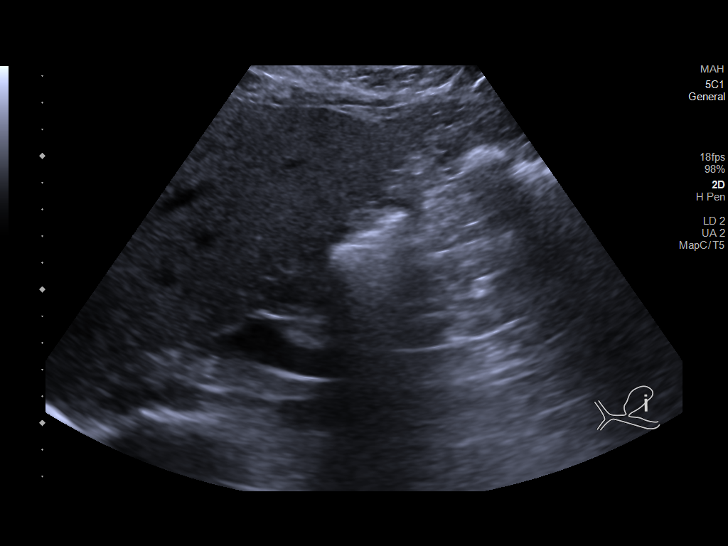
[im 7/25]
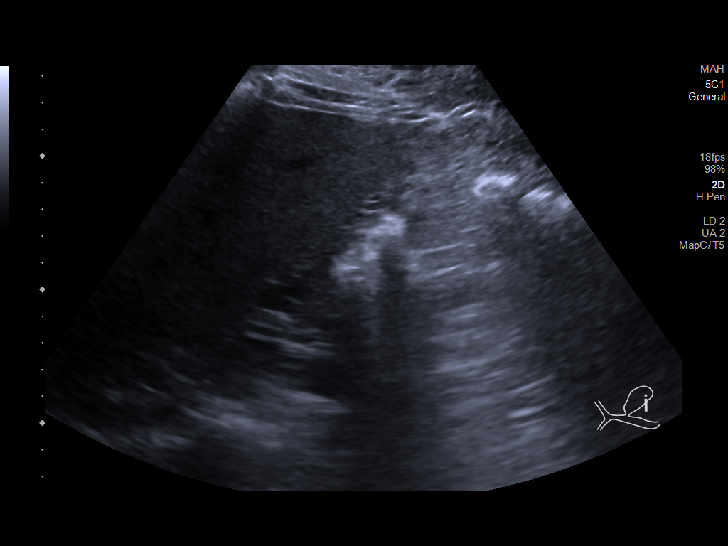
[im 9/25]
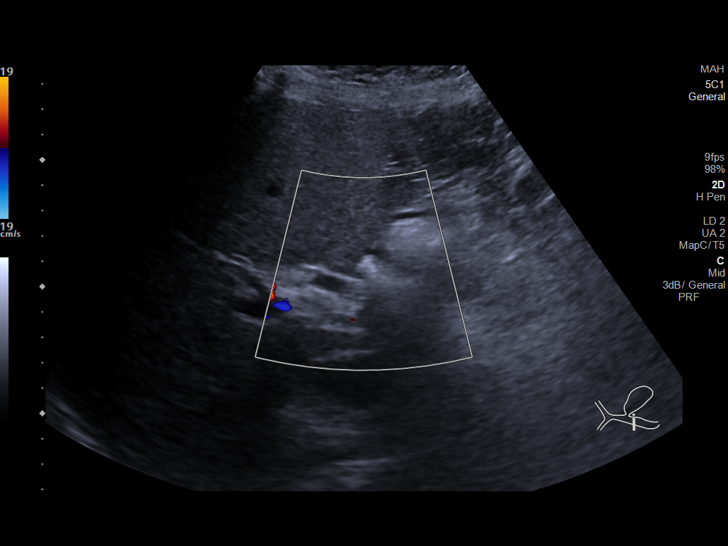
[im 10/25]
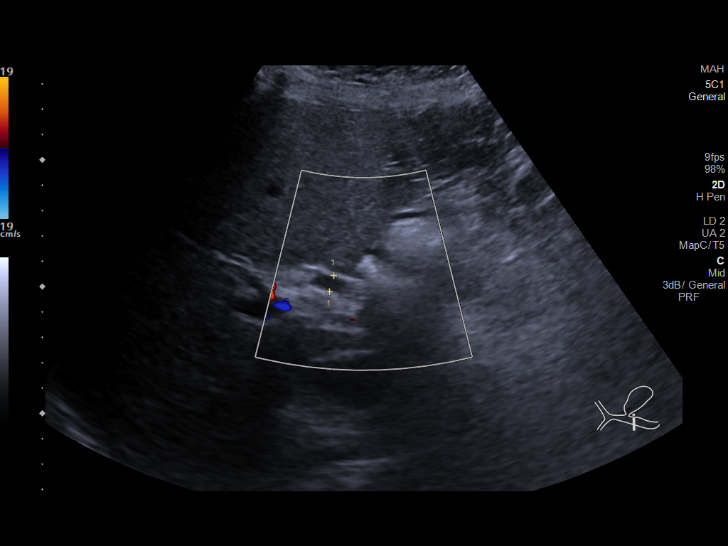
[im 12/25]
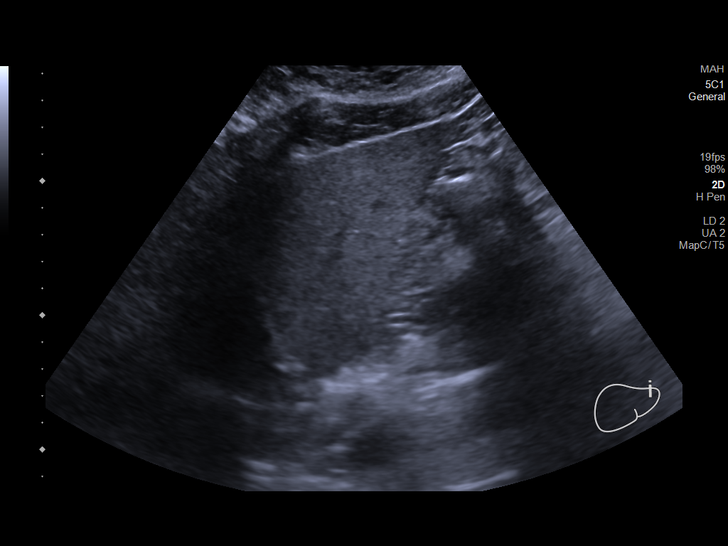
[im 14/25]
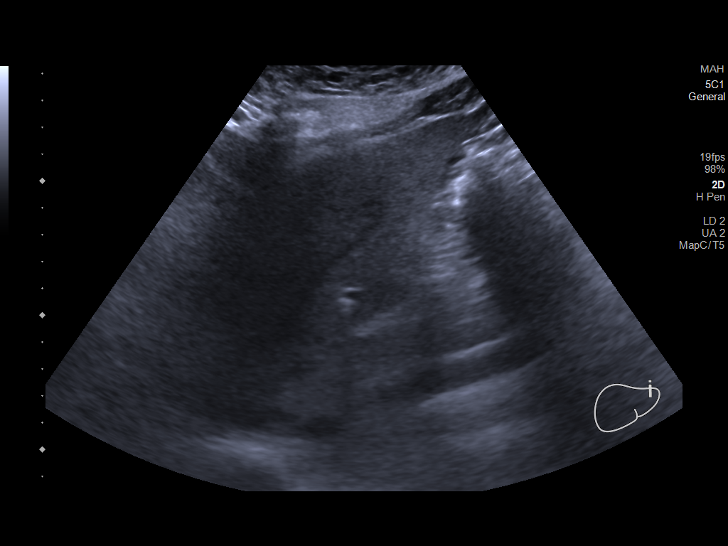
[im 16/25]
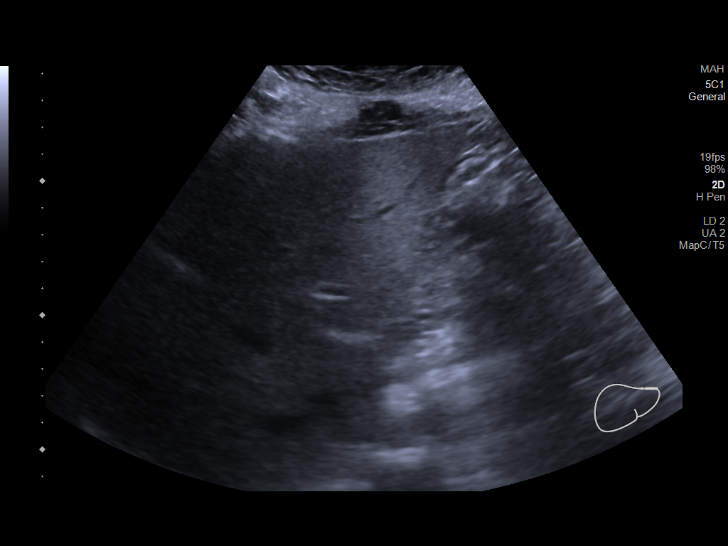
[im 17/25]
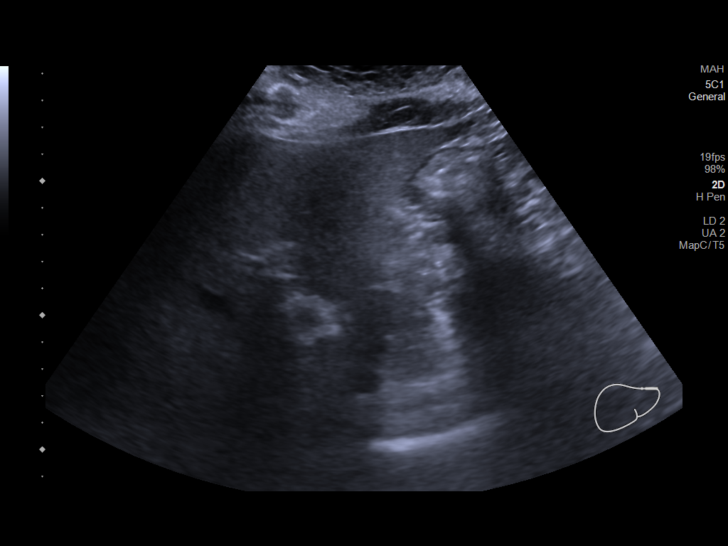
[im 19/25]
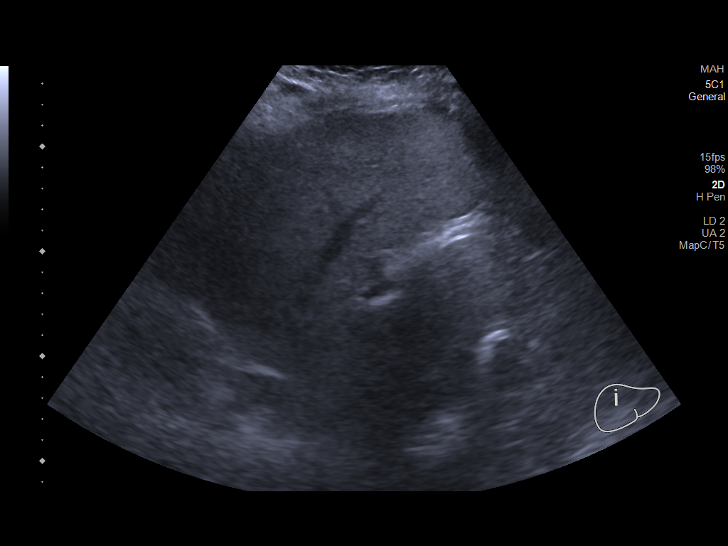
[im 21/25]
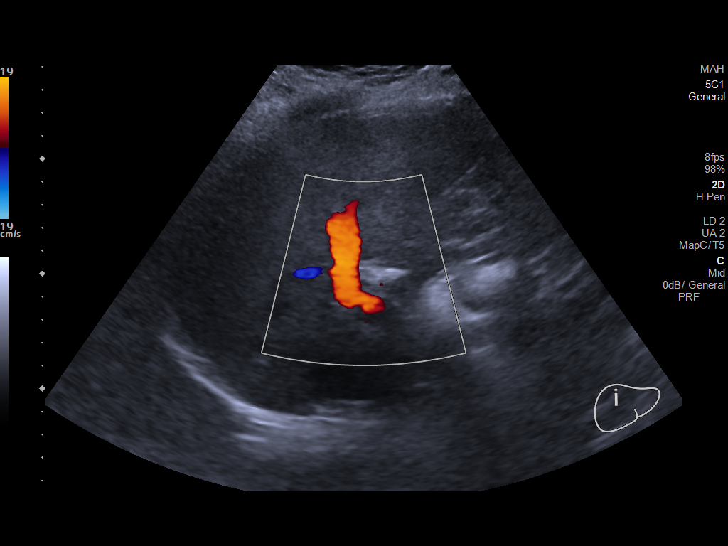
[im 23/25]
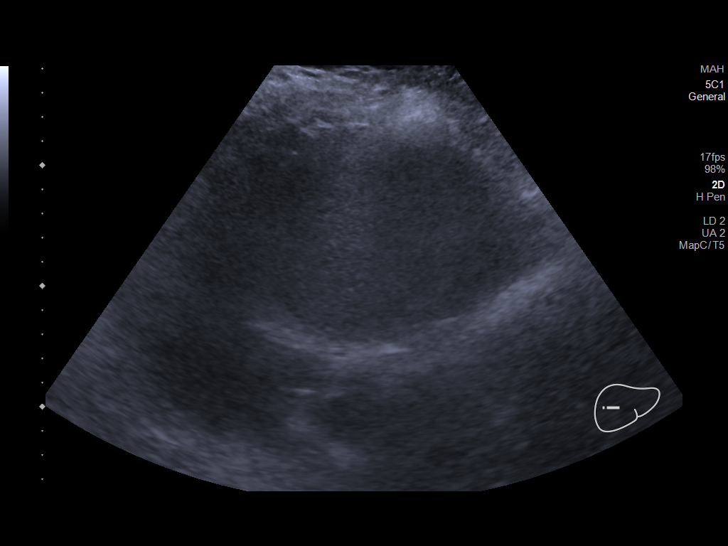
[im 25/25]
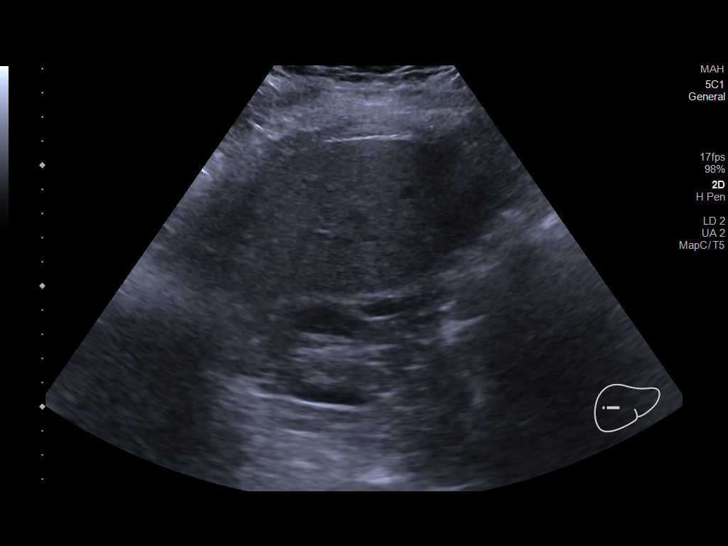

[14 of 25 positions shown; findings below may reference images not displayed]

FINDINGS: Gallbladder:

The gallbladder is surgically absent.

Common bile duct:

Diameter: 7 mm, likely secondary to prior cholecystectomy.

Liver:

No focal lesion identified. Within normal limits in parenchymal
echogenicity. Portal vein is patent on color Doppler imaging with
normal direction of blood flow towards the liver.

Other: None.
IMPRESSION: No acute process in the right upper quadrant. No etiology is seen
for the patient's right upper quadrant pain.

## 2021-03-05 MED ORDER — SODIUM CHLORIDE 0.9 % IV BOLUS
1000.0000 mL | Freq: Once | INTRAVENOUS | Status: AC
Start: 2021-03-05 — End: 2021-03-05
  Administered 2021-03-05: 1000 mL via INTRAVENOUS

## 2021-03-05 MED ORDER — DICYCLOMINE HCL 20 MG PO TABS: 20.0000 mg | ORAL_TABLET | Freq: Two times a day (BID) | ORAL | 0 refills | Status: DC

## 2021-03-05 MED ORDER — FENTANYL CITRATE (PF) 100 MCG/2ML IJ SOLN
50.0000 ug | Freq: Once | INTRAMUSCULAR | Status: AC
  Administered 2021-03-05: 50 ug via INTRAVENOUS
  Filled 2021-03-05: qty 2

## 2021-03-05 MED ORDER — IOHEXOL 300 MG/ML  SOLN
100.0000 mL | Freq: Once | INTRAMUSCULAR | Status: AC | PRN
  Administered 2021-03-05: 100 mL via INTRAVENOUS

## 2021-03-05 MED ORDER — METOCLOPRAMIDE HCL 10 MG PO TABS: 10.0000 mg | ORAL_TABLET | Freq: Four times a day (QID) | ORAL | 0 refills | Status: DC

## 2021-03-05 NOTE — ED Triage Notes (Signed)
Pt arrives ambulatory to triage with a cane with c/o pain in RUQ for 1-2 weeks. Pt had upper and lower endoscopy about a moth ago. Pt reports  nausea and diarrhea.

## 2021-03-05 NOTE — ED Notes (Signed)
Pt assisted to bathroom, pt attempted to obtain urine and dropped specimen.  Iv fluid bolus given will try again

## 2021-03-05 NOTE — ED Notes (Addendum)
No note needed 

## 2021-03-05 NOTE — Discharge Instructions (Addendum)
Your workup was very reassuring in the ED today without obvious findings Please follow up with your PCP and gastroenterologist regarding ED visit today Pick up medications and take as prescribed  Return to the ED for any new/worsening symptoms

## 2021-03-05 NOTE — ED Notes (Signed)
Patient transported to CT, attempted to start IV, Larene Beach with CT will attempt IV in CT scan

## 2021-03-05 NOTE — ED Notes (Signed)
Pt ambulates to the bathroom to void.  

## 2021-03-05 NOTE — ED Notes (Signed)
Pt up to restroom for urine specimen

## 2021-03-05 NOTE — ED Provider Notes (Signed)
[facility] Provider Note   CSN: [id number] Arrival date & time: 03/05/21  1051     History Chief Complaint  Patient presents with   Abdominal Pain    Casey Jordan is a 55 y.o. female who presents to the ED today with complaint of gradual onset, constant, sharp/stabbing, RUQ abdominal pain that began about 1 week ago. Pt also complains of nausea, NBNB emesis, and diarrhea. She reports taking Tramadol for chronic back pain however has not been helping with her abdominal pain. She reports hx of cholecystectomy in 2012. She was recently seen in the ED last month for epigastric pain s/p endoscopy and colonscopy with reassuring work up. Reports she felt improved from that however this pain is new. She does report foul smelling urine. She denies fevers, chills, flank pain, dysuria, urinary frequency, pelvic pain, vaginal discharge, or any other associated symptoms.   The history is provided by the patient and medical records.      Past Medical History:  Diagnosis Date   Anemia    Asthma    Cardiac murmur 04/30/2020   Depression    Dizziness 07/05/2018   Formatting of this note might be different from the original. Onset December 2019.  MRI of the brain okay.  Last Assessment & Plan:  Formatting of this note might be different from the original. Complex disease history.  Onset back in December.  Since then she has experienced syncopal episodes on at least 2 occasions.  Frequent spinning episodes and almost constant unsteadiness that is improved wi   DVT (deep venous thrombosis) (HCC)    lower leg while traveling went to lung   GAD (generalized anxiety disorder) 08/06/2017   Generalized anxiety disorder 08/06/2017   GERD (gastroesophageal reflux disease)    Headache    Migraines   History of deep venous thrombosis (DVT) of distal vein of right lower extremity 12/21/2016   Formatting of this note might be different from the original. Anticoagulation x 6  months; no recurrence.  Dx 2010 Formatting of this note might be different from the original. Overview:  Anticoagulation x 6 months; no recurrence.  Dx 2010   History of fusion of cervical spine 12/21/2016   Formatting of this note might be different from the original. 2011-2012 Formatting of this note might be different from the original. Overview:  2011-2012   History of kidney stones    Left ureteral calculus 11/18/2017   Major depressive disorder, recurrent episode, moderate ([place]) 12/28/2019   Major depressive disorder, single episode, severe with psychotic features ([place]) 06/18/2020   Mild intermittent asthma without complication [id number]   Mixed hyperlipidemia 06/29/2017   Formatting of this note might be different from the original. 10 year CVD risk 5%   Morbid obesity ([place]) 04/30/2020   Nausea and vomiting 06/25/2018   Obesity (BMI 30-39.9) 12/21/2016   Post-traumatic stress disorder, unspecified 12/21/2018   Posttraumatic stress disorder 06/18/2020   Preop cardiovascular exam 04/30/2020   Restless leg syndrome 12/21/2016   Spondylolysis of cervical spine 10/01/2020   Thrombophlebitis arm 07/06/2018   Formatting of this note might be different from the original. 06/2018: Noted on [place] RUE   Vaginal Pap smear, abnormal    Vitamin D insufficiency     Patient Active Problem List   Diagnosis Date Noted   Bipolar 2 disorder, major depressive episode ([place]) 11/28/2020   Spondylolysis of cervical spine 10/01/2020   Posttraumatic stress disorder 06/18/2020   Major depressive disorder, single episode, severe  with psychotic features ([place]) 06/18/2020   Preop cardiovascular exam 04/30/2020   Morbid obesity ([place]) 04/30/2020   Cardiac murmur 04/30/2020   Anemia    Asthma    Depression    DVT (deep venous thrombosis) (HCC)    Headache    History of kidney stones    Vaginal Pap smear, abnormal    Vitamin D insufficiency    Major depressive disorder, recurrent episode, moderate ([place]) 12/28/2019    Post-traumatic stress disorder, unspecified 12/21/2018   Thrombophlebitis arm 07/06/2018   Dizziness 07/05/2018   Nausea and vomiting 06/25/2018   Left ureteral calculus 11/18/2017   Generalized anxiety disorder 08/06/2017   Mild intermittent asthma without complication [id number]   GAD (generalized anxiety disorder) 08/06/2017   Mixed hyperlipidemia 06/29/2017   GERD (gastroesophageal reflux disease) 05/28/2017   History of deep venous thrombosis (DVT) of distal vein of right lower extremity 12/21/2016   History of fusion of cervical spine 12/21/2016   Obesity (BMI 30-39.9) 12/21/2016   Restless leg syndrome 12/21/2016    Past Surgical History:  Procedure Laterality Date   CHOLECYSTECTOMY     COLONOSCOPY     COLPOSCOPY     HYSTEROSCOPY WITH D & C N/A 04/02/2017   Procedure: DILATATION AND CURETTAGE /HYSTEROSCOPY;  Surgeon: Linda Hedges, DO;  Location: [facility];  Service: Gynecology;  Laterality: N/A;   KIDNEY STONE SURGERY     OVARY SURGERY     POSTERIOR FUSION CERVICAL SPINE     TONSILLECTOMY       OB History     Gravida  3   Para  3   Term  3   Preterm      AB      Living  3      SAB      IAB      Ectopic      Multiple      Live Births              Family History  Problem Relation Age of Onset   Kidney disease Mother    COPD Mother    Alcohol abuse Mother    Diabetes Mother    Hyperlipidemia Father    Hypertension Father    Hearing loss Father    Alcohol abuse Father    Diabetes Father    Heart disease Father    Kidney disease Father    COPD Sister    Hearing loss Brother    Birth defects Brother    Cancer Brother    Alcohol abuse Brother    Depression Son    Alcohol abuse Son    Alcohol abuse Maternal Grandmother    Stroke Maternal Grandfather    Anxiety disorder Maternal Grandfather    Miscarriages / Stillbirths Paternal Grandmother    Early death Paternal Grandmother    Diabetes Paternal Grandfather     Social History    Tobacco Use   Smoking status: Never   Smokeless tobacco: Never  Vaping Use   Vaping Use: Some days  Substance Use Topics   Alcohol use: Not Currently   Drug use: Never    Home Medications Prior to Admission medications   Medication Sig Start Date End Date Taking? Authorizing Provider  dicyclomine (BENTYL) 20 MG tablet Take 1 tablet (20 mg total) by mouth 2 (two) times daily. 03/05/21  Yes Corissa Oguinn, PA-C  metoCLOPramide (REGLAN) 10 MG tablet Take 1 tablet (10 mg total) by mouth every 6 (six) hours. 03/05/21  Yes Alroy Bailiff,  Mahalia Dykes, PA-C  albuterol (VENTOLIN HFA) 108 (90 Base) MCG/ACT inhaler Inhale 2 puffs into the lungs every 6 (six) hours as needed for wheezing or shortness of breath. 03/06/20   Argentina Donovan, PA-C  famotidine (PEPCID) 20 MG tablet Take 1 tablet (20 mg total) by mouth 2 (two) times daily. Needs OV for additional refills. 01/28/21   Charlott Rakes, MD  gabapentin (NEURONTIN) 300 MG capsule Take 1 capsule (300 mg total) by mouth 3 (three) times daily. 12/30/20 03/30/21  Salley Slaughter, NP  hydrOXYzine (ATARAX/VISTARIL) 50 MG tablet Take 1 tablet (50 mg total) by mouth 3 (three) times daily as needed. 12/30/20   Salley Slaughter, NP  ipratropium-albuterol (DUONEB) 0.5-2.5 (3) MG/3ML SOLN 1 nebule every 4 to 6 hrs prn wheezing 03/06/20   Argentina Donovan, PA-C  lamoTRIgine (LAMICTAL) 25 MG tablet Take 1 tablet (25 mg total) by mouth daily. 12/30/20   Salley Slaughter, NP  LORazepam (ATIVAN) 0.5 MG tablet Take 1 tablet (0.5 mg total) by mouth 2 (two) times daily. 12/30/20 03/30/21  Salley Slaughter, NP  meclizine (ANTIVERT) 25 MG tablet Take 1 tablet (25 mg total) by mouth 3 (three) times daily as needed for dizziness. 01/28/21   Charlott Rakes, MD  Multiple Vitamin (MULTIVITAMIN WITH MINERALS) TABS tablet Take 1 tablet by mouth daily.    [provider]  omeprazole (PRILOSEC) 40 MG capsule Take 1 capsule (40 mg total) by mouth 2 (two) times daily.  01/28/21   Charlott Rakes, MD  pantoprazole (PROTONIX) 40 MG tablet Take 1 tablet (40 mg total) by mouth 2 (two) times daily before a meal. 01/10/21   Marrian Salvage, FNP  PARoxetine (PAXIL) 40 MG tablet Take 1 tablet (40 mg total) by mouth daily. 12/30/20   Salley Slaughter, NP  promethazine (PHENERGAN) 12.5 MG tablet Take 1 tablet (12.5 mg total) by mouth every 8 (eight) hours as needed for nausea or vomiting. 08/09/19   Fulp, Cammie, MD  QUEtiapine (SEROQUEL) 300 MG tablet Take 1 tablet (300 mg total) by mouth at bedtime. 12/30/20 03/30/21  Salley Slaughter, NP  traZODone (DESYREL) 150 MG tablet Take 1 tablet (150 mg total) by mouth at bedtime. 12/30/20   Salley Slaughter, NP    Allergies    Codeine, Naproxen, Avocado, Sulfa antibiotics, Sulfamethoxazole, Sulfasalazine, Fluconazole, Nickel, Ondansetron, and Penicillins  Review of Systems   Review of Systems  Constitutional:  Negative for chills and fever.  Respiratory:  Negative for cough and shortness of breath.   Cardiovascular:  Negative for chest pain.  Gastrointestinal:  Positive for abdominal pain, diarrhea, nausea and vomiting.  Genitourinary:  Negative for dysuria, flank pain, frequency and pelvic pain.  All other systems reviewed and are negative.  Physical Exam Updated Vital Signs BP 109/79 (BP Location: Right Arm)   Pulse 71   Temp 97.7 F (36.5 C) (Oral)   Resp 18   Ht '5\' 5"'$  (1.651 m)   Wt 104.3 kg   LMP 03/21/2017 (Exact Date)   SpO2 96%   BMI 38.27 kg/m   Physical Exam Vitals and nursing note reviewed.  Constitutional:      Appearance: She is obese. She is not ill-appearing or diaphoretic.  HENT:     Head: Normocephalic and atraumatic.  Eyes:     Conjunctiva/sclera: Conjunctivae normal.  Cardiovascular:     Rate and Rhythm: Normal rate and regular rhythm.     Heart sounds: Normal heart sounds.  Pulmonary:  Effort: Pulmonary effort is normal.     Breath sounds: Normal breath sounds. No  wheezing, rhonchi or rales.  Abdominal:     General: Abdomen is flat.     Palpations: Abdomen is soft.     Tenderness: There is abdominal tenderness in the right upper quadrant. There is no right CVA tenderness, left CVA tenderness, guarding or rebound.  Musculoskeletal:     Cervical back: Neck supple.  Skin:    General: Skin is warm and dry.  Neurological:     Mental Status: She is alert.    ED Results / Procedures / Treatments   Labs (all labs ordered are listed, but only abnormal results are displayed) Labs Reviewed  COMPREHENSIVE METABOLIC PANEL - Abnormal; Notable for the following components:      Result Value   Potassium 3.3 (*)    Glucose, Bld 194 (*)    AST 59 (*)    ALT 53 (*)    All other components within normal limits  URINALYSIS, ROUTINE W REFLEX MICROSCOPIC - Abnormal; Notable for the following components:   Color, Urine STRAW (*)    Specific Gravity, Urine <1.005 (*)    All other components within normal limits  LIPASE, BLOOD  CBC    EKG None  Radiology CT Abdomen Pelvis W Contrast  Result Date: 03/05/2021 CLINICAL DATA:  Right upper quadrant abdominal pain, nausea and diarrhea for 1-2 weeks. EXAM: CT ABDOMEN AND PELVIS WITH CONTRAST TECHNIQUE: Multidetector CT imaging of the abdomen and pelvis was performed using the standard protocol following bolus administration of intravenous contrast. CONTRAST:  162m OMNIPAQUE IOHEXOL 300 MG/ML  SOLN COMPARISON:  02/04/2021 FINDINGS: Lower chest: The lung bases are clear of acute process. No pleural effusion or pulmonary lesions. The heart is normal in size. No pericardial effusion. The distal esophagus and aorta are unremarkable. Hepatobiliary: No hepatic lesions or intrahepatic biliary dilatation. Mild diffuse fatty infiltration of the liver. The gallbladder is surgically absent. Mild associated common bile duct dilatation, unchanged. Pancreas: No mass, inflammation or ductal dilatation. Spleen: Normal size.  No focal  lesions. Adrenals/Urinary Tract: Adrenal glands and kidneys are unremarkable. No renal lesions, renal calculi or hydroureteronephrosis. The bladder is unremarkable. Stomach/Bowel: The stomach, duodenum, small bowel and are unremarkable. No acute inflammatory changes, mass lesions or obstructive findings. The terminal ileum is normal. The appendix is normal. Vascular/Lymphatic: The aorta is normal in caliber. No dissection. The branch vessels are patent. The major venous structures are patent. No mesenteric or retroperitoneal mass or adenopathy. Small scattered lymph nodes are noted. Reproductive: Retroverted uterus is noted. Stable 4 cm posterior fundal fibroid. The ovaries are unremarkable. Other: No pelvic mass or adenopathy. No free pelvic fluid collections. No inguinal mass or adenopathy. No abdominal wall hernia or subcutaneous lesions. Musculoskeletal: No significant bony findings. Stable degenerative changes involving the lower lumbar spine. IMPRESSION: 1. No acute abdominal/pelvic findings, mass lesions or adenopathy. 2. Status post cholecystectomy with mild associated common bile duct dilatation. 3. Mild diffuse fatty infiltration of the liver. 4. Stable 4 cm posterior fundal fibroid. Electronically Signed   By: PMarijo SanesM.D.   On: 03/05/2021 15:48   [place]dom[place] Limited RUQ (LIVER/GB)  Result Date: 03/05/2021 CLINICAL DATA:  Right upper quadrant pain EXAM: ULTRASOUND ABDOMEN LIMITED RIGHT UPPER QUADRANT COMPARISON:  CT abdomen pelvis 03/06/2021 FINDINGS: Gallbladder: The gallbladder is surgically absent. Common bile duct: Diameter: 7 mm, likely secondary to prior cholecystectomy. Liver: No focal lesion identified. Within normal limits in parenchymal echogenicity. Portal vein  is patent on color Doppler imaging with normal direction of blood flow towards the liver. Other: None. IMPRESSION: No acute process in the right upper quadrant. No etiology is seen for the patient's right upper quadrant pain.  Electronically Signed   By: Merilyn Baba M.D.   On: 03/05/2021 17:01    Procedures Procedures   Medications Ordered in ED Medications  sodium chloride 0.9 % bolus 1,000 mL ( Intravenous Stopped 03/05/21 1800)  fentaNYL (SUBLIMAZE) injection 50 mcg (50 mcg Intravenous Given 03/05/21 1445)  iohexol (OMNIPAQUE) 300 MG/ML solution 100 mL (100 mLs Intravenous Contrast Given 03/05/21 1501)    ED Course  I have reviewed the triage vital signs and the nursing notes.  Pertinent labs & imaging results that were available during my care of the patient were reviewed by me and considered in my medical decision making (see chart for details).    MDM Rules/Calculators/A&P                           55 year old female who presents to the ED today with complaint of right upper quadrant pain for the past week with associated nausea, vomiting, diarrhea.  History of cholecystectomy in 2012.  Arrival to the ED today patient is slightly tachycardic at 108, remainder of vitals unremarkable.  On my exam she has obvious right upper quadrant tenderness to palpation without rebound or guarding.  No tenderness palpation to remainder of abdomen.  No flank tenderness palpation.  She does report foul-smelling urine however.  No other urinary symptoms.  No pelvic pain or vaginal discharge.  She did have lab work obtained prior to being seen including a CBC, CMP, lipase.  CBC without leukocytosis and hemoglobin stable at 14.1.  CMP with a potassium of 3.3, will plan to replete.  Glucose slightly elevated at 194.  Bicarb within normal limits and no gap.  AST and ALT are slightly elevated today at 59 and 53 however compared to previous labs unchanged.  Lipase within normal limits at 31.  Pending urinalysis as patient has not urinated at this time.  Given amount of discomfort patient is in we will plan for CT abdomen pelvis for further evaluation.   CT: IMPRESSION:  1. No acute abdominal/pelvic findings, mass lesions or  adenopathy.  2. Status post cholecystectomy with mild associated common bile duct  dilatation.  3. Mild diffuse fatty infiltration of the liver.  4. Stable 4 cm posterior fundal fibroid.   Will plan for ultrasound to evaluate for CBD size  Ultrasound:   IMPRESSION:  No acute process in the right upper quadrant. No etiology is seen  for the patient's right upper quadrant pain.   U/A without signs of infection or blood  No obvious findings on workup today. Pt will need to follow up with PCP and GI for same. Stable for discharge at this time.   This note was prepared using Dragon voice recognition software and may include unintentional dictation errors due to the inherent limitations of voice recognition software.  Final Clinical Impression(s) / ED Diagnoses Final diagnoses:  RUQ abdominal pain  Nausea vomiting and diarrhea    Rx / DC Orders ED Discharge Orders          Ordered    metoCLOPramide (REGLAN) 10 MG tablet  Every 6 hours        03/05/21 1838    dicyclomine (BENTYL) 20 MG tablet  2 times daily  03/05/21 1838             Discharge Instructions      Your workup was very reassuring in the ED today without obvious findings Please follow up with your PCP and gastroenterologist regarding ED visit today Pick up medications and take as prescribed  Return to the ED for any new/worsening symptoms       Eustaquio Maize, PA-C 03/05/21 1839    Sherwood Gambler, MD 03/08/21 [phone]

## 2021-03-05 NOTE — ED Notes (Signed)
Back from CT scan, unable to get IV, will attempt US guided, provider made aware

## 2021-03-14 DIAGNOSIS — K760 Fatty (change of) liver, not elsewhere classified: Secondary | ICD-10-CM

## 2021-03-14 DIAGNOSIS — R7303 Prediabetes: Secondary | ICD-10-CM

## 2021-03-14 DIAGNOSIS — N289 Disorder of kidney and ureter, unspecified: Secondary | ICD-10-CM

## 2021-03-14 DIAGNOSIS — R748 Abnormal levels of other serum enzymes: Secondary | ICD-10-CM

## 2021-03-14 DIAGNOSIS — Z9049 Acquired absence of other specified parts of digestive tract: Secondary | ICD-10-CM

## 2021-03-14 DIAGNOSIS — D259 Leiomyoma of uterus, unspecified: Secondary | ICD-10-CM | POA: Insufficient documentation

## 2021-03-14 HISTORY — DX: Leiomyoma of uterus, unspecified: D25.9

## 2021-03-14 HISTORY — DX: Fatty (change of) liver, not elsewhere classified: K76.0

## 2021-03-14 HISTORY — DX: Prediabetes: R73.03

## 2021-03-14 HISTORY — DX: Abnormal levels of other serum enzymes: R74.8

## 2021-03-14 HISTORY — DX: Acquired absence of other specified parts of digestive tract: Z90.49

## 2021-03-14 HISTORY — DX: Disorder of kidney and ureter, unspecified: N28.9

## 2021-03-18 ENCOUNTER — Telehealth (HOSPITAL_COMMUNITY): Payer: Self-pay | Admitting: *Deleted

## 2021-03-18 ENCOUNTER — Other Ambulatory Visit (HOSPITAL_COMMUNITY): Payer: Self-pay | Admitting: Psychiatry

## 2021-03-18 DIAGNOSIS — F3181 Bipolar II disorder: Secondary | ICD-10-CM

## 2021-03-18 MED ORDER — LAMOTRIGINE 25 MG PO TABS: 25.0000 mg | ORAL_TABLET | Freq: Every day | ORAL | 2 refills | Status: DC

## 2021-03-18 NOTE — Telephone Encounter (Signed)
Fax request from [facility] for patients Lamictal to be renewed. THe mail order pharmacy prefers 90 day rx's. Will bring this request to Dr Ronne Binning attention for her to e sign it in to this preferred pharmacy.

## 2021-03-18 NOTE — Telephone Encounter (Signed)
Provider refilled medication and sent to preferred pharmacy.  A 90-day supply of Lamictal was not ordered because Lamictal will potentially be increased at patient's next visit.

## 2021-03-31 ENCOUNTER — Other Ambulatory Visit (HOSPITAL_COMMUNITY): Payer: Self-pay | Admitting: Psychiatry

## 2021-03-31 DIAGNOSIS — F411 Generalized anxiety disorder: Secondary | ICD-10-CM

## 2021-03-31 DIAGNOSIS — F3181 Bipolar II disorder: Secondary | ICD-10-CM

## 2021-04-08 ENCOUNTER — Ambulatory Visit: Payer: Medicaid Other | Admitting: Cardiology

## 2021-04-09 ENCOUNTER — Encounter (HOSPITAL_COMMUNITY): Payer: Self-pay | Admitting: Psychiatry

## 2021-04-09 ENCOUNTER — Telehealth (INDEPENDENT_AMBULATORY_CARE_PROVIDER_SITE_OTHER): Payer: Medicare HMO | Admitting: Psychiatry

## 2021-04-09 ENCOUNTER — Other Ambulatory Visit: Payer: Self-pay

## 2021-04-09 DIAGNOSIS — F3181 Bipolar II disorder: Secondary | ICD-10-CM

## 2021-04-09 DIAGNOSIS — F411 Generalized anxiety disorder: Secondary | ICD-10-CM | POA: Diagnosis not present

## 2021-04-09 MED ORDER — LAMOTRIGINE 25 MG PO TABS: 25.0000 mg | ORAL_TABLET | Freq: Every day | ORAL | 2 refills | Status: DC

## 2021-04-09 MED ORDER — HYDROXYZINE HCL 50 MG PO TABS: 50.0000 mg | ORAL_TABLET | Freq: Three times a day (TID) | ORAL | 2 refills | Status: DC | PRN

## 2021-04-09 MED ORDER — PAROXETINE HCL 40 MG PO TABS: 40.0000 mg | ORAL_TABLET | Freq: Every day | ORAL | 2 refills | Status: DC

## 2021-04-09 MED ORDER — QUETIAPINE FUMARATE 300 MG PO TABS: 300.0000 mg | ORAL_TABLET | Freq: Every day | ORAL | 3 refills | Status: DC

## 2021-04-09 MED ORDER — QUETIAPINE FUMARATE 50 MG PO TABS: 50.0000 mg | ORAL_TABLET | Freq: Every day | ORAL | 3 refills | Status: DC

## 2021-04-09 MED ORDER — TRAZODONE HCL 150 MG PO TABS: 150.0000 mg | ORAL_TABLET | Freq: Every day | ORAL | 3 refills | Status: DC

## 2021-04-09 NOTE — Progress Notes (Signed)
[place] MD/PA/NP OP Progress Note Virtual Visit via Telephone Note  I connected with Casey Jordan on 04/09/21 at  4:30 PM EDT by telephone and verified that I am speaking with the correct person using two identifiers.  Location: Patient: home Provider: Clinic   I discussed the limitations, risks, security and privacy concerns of performing an evaluation and management service by telephone and the availability of in person appointments. I also discussed with the patient that there may be a patient responsible charge related to this service. The patient expressed understanding and agreed to proceed.   I provided 30 minutes of non-face-to-face time during this encounter.            04/09/2021 1:16 PM Casey Jordan  MRN:  [id number]  Chief Complaint: "The last two weeks I shut down"  HPI: 55  year old female seen today for follow up psychiatric evaluation. She has a psychiatric history of anxiety, depression,bipolar disorder, and PTSD. She is currently managed on Ativan 0.5 mg  twice daily (taken in 2 weeks), prazosin 1 mg nightly, hydroxyzine 25 mg 3 times daily as needed, trazodone 150 mg nightly Paxil 50 mg daily, Lamictal 50 mg daily, and Seroquel 300 mg nightly. She noted that she started Lamictal for a while and then discontinued it because she thought it was keeping her awake.  She informed Probation officer that her other medications are somewhat effective in managing her psychiatric conditions.  Today patient was unable to logon virtually so her assessment was done over the phone.  During exam she was pleasant, cooperative, talkative, and somewhat engaged in conversation. She informed Probation officer that for the last 2 weeks she has shut down.  Patient reports that since her last visit, she is having increased difficulties with daily life.  She notes that she has several comorbidities that is exacerbating her daily routine.  She notes that she believes she is suffering from UTI, has chronic  neck/back pain, and has had several falls which caused her to have impaired memory.  She informed Probation officer that her son has seen a change in her behavior.  She informed Probation officer that she has been seeing her primary care doctor more frequently.  She denies alcohol or illegal drug use.    Patient notes that recently her mood has been fluctuating, she is irritable, distractible, and having racing thoughts.  She notes that she asked her mother if she believes she may have bipolar disorder.  Provider informed writer that she has a history of bipolar 2.  Provider discussed this diagnosis as well as patient other psychiatric diagnoses.  Today she notes that she often has visual and auditory hallucinations.  She reports that she often feels like someone is knocking on her door at night and no one is there. She is frequently awoken from sleep by noises that do not occur. Last time she reported seeing things that were not there such as people in the corner, in the backyard or people opening doors. She reports that keeping her TV on at night keeps her from seeing the "people" but if it is dark then she will see them.   Patient notes that the above exacerbates her anxiety and depression.  Patient hyperverbal today and unable to focus on the GAD-7 and PHQ-9 so they were not conducted.  She reports poor sleep and a notes 20 lb weight loss in the past few months due to a lack of appetite.    Today she is agreeable to increasing Seroquel  300 mg to 350 mg to help manage mood and symptoms of psychosis.  She is also agreeable to starting Lamictal 25 mg for 2 weeks and then increasing to 50 mg to help manage symptoms with bipolar disorder.  She will continue all other medications as prescribed.  Ativan not refilled at this time as patient notes that she has not taken it in over 2 weeks. No other concerns at this time.       Visit Diagnosis:    ICD-10-CM   1. Bipolar 2 disorder, major depressive episode (HCC)  F31.81 traZODone  (DESYREL) 150 MG tablet    QUEtiapine (SEROQUEL) 300 MG tablet    PARoxetine (PAXIL) 40 MG tablet    lamoTRIgine (LAMICTAL) 25 MG tablet    QUEtiapine (SEROQUEL) 50 MG tablet    2. Generalized anxiety disorder  F41.1 QUEtiapine (SEROQUEL) 300 MG tablet    PARoxetine (PAXIL) 40 MG tablet    hydrOXYzine (ATARAX/VISTARIL) 50 MG tablet    QUEtiapine (SEROQUEL) 50 MG tablet      Past Psychiatric History: Bipolar 2, anxiety, depression, and PTSD.  Past Medical History:  Past Medical History:  Diagnosis Date   Anemia    Asthma    Cardiac murmur 04/30/2020   Depression    Dizziness 07/05/2018   Formatting of this note might be different from the original. Onset December 2019.  MRI of the brain okay.  Last Assessment & Plan:  Formatting of this note might be different from the original. Complex disease history.  Onset back in December.  Since then she has experienced syncopal episodes on at least 2 occasions.  Frequent spinning episodes and almost constant unsteadiness that is improved wi   DVT (deep venous thrombosis) (HCC)    lower leg while traveling went to lung   GAD (generalized anxiety disorder) 08/06/2017   Generalized anxiety disorder 08/06/2017   GERD (gastroesophageal reflux disease)    Headache    Migraines   History of deep venous thrombosis (DVT) of distal vein of right lower extremity 12/21/2016   Formatting of this note might be different from the original. Anticoagulation x 6 months; no recurrence.  Dx 2010 Formatting of this note might be different from the original. Overview:  Anticoagulation x 6 months; no recurrence.  Dx 2010   History of fusion of cervical spine 12/21/2016   Formatting of this note might be different from the original. 2011-2012 Formatting of this note might be different from the original. Overview:  2011-2012   History of kidney stones    Left ureteral calculus 11/18/2017   Major depressive disorder, recurrent episode, moderate ([place]) 12/28/2019   Major  depressive disorder, single episode, severe with psychotic features ([place]) 06/18/2020   Mild intermittent asthma without complication 6/62/9476   Mixed hyperlipidemia 06/29/2017   Formatting of this note might be different from the original. 10 year CVD risk 5%   Morbid obesity ([place]) 04/30/2020   Nausea and vomiting 06/25/2018   Obesity (BMI 30-39.9) 12/21/2016   Post-traumatic stress disorder, unspecified 12/21/2018   Posttraumatic stress disorder 06/18/2020   Preop cardiovascular exam 04/30/2020   Restless leg syndrome 12/21/2016   Spondylolysis of cervical spine 10/01/2020   Thrombophlebitis arm 07/06/2018   Formatting of this note might be different from the original. 06/2018: Noted on [place] RUE   Vaginal Pap smear, abnormal    Vitamin D insufficiency     Past Surgical History:  Procedure Laterality Date   CHOLECYSTECTOMY     COLONOSCOPY  COLPOSCOPY     HYSTEROSCOPY WITH D & C N/A 04/02/2017   Procedure: DILATATION AND CURETTAGE /HYSTEROSCOPY;  Surgeon: Linda Hedges, DO;  Location: [facility];  Service: Gynecology;  Laterality: N/A;   KIDNEY STONE SURGERY     OVARY SURGERY     POSTERIOR FUSION CERVICAL SPINE     TONSILLECTOMY      Family Psychiatric History: Unknown  Family History:  Family History  Problem Relation Age of Onset   Kidney disease Mother    COPD Mother    Alcohol abuse Mother    Diabetes Mother    Hyperlipidemia Father    Hypertension Father    Hearing loss Father    Alcohol abuse Father    Diabetes Father    Heart disease Father    Kidney disease Father    COPD Sister    Hearing loss Brother    Birth defects Brother    Cancer Brother    Alcohol abuse Brother    Depression Son    Alcohol abuse Son    Alcohol abuse Maternal Grandmother    Stroke Maternal Grandfather    Anxiety disorder Maternal Grandfather    Miscarriages / Stillbirths Paternal Grandmother    Early death Paternal Grandmother    Diabetes Paternal Grandfather     Social History:   Social History   Socioeconomic History   Marital status: Divorced    Spouse name: Not on file   Number of children: Not on file   Years of education: Not on file   Highest education level: Not on file  Occupational History   Not on file  Tobacco Use   Smoking status: Never   Smokeless tobacco: Never  Vaping Use   Vaping Use: Some days  Substance and Sexual Activity   Alcohol use: Not Currently   Drug use: Never   Sexual activity: Not on file  Other Topics Concern   Not on file  Social History Narrative   Not on file   Social Determinants of Health   Financial Resource Strain: Not on file  Food Insecurity: Not on file  Transportation Needs: Not on file  Physical Activity: Not on file  Stress: Not on file  Social Connections: Not on file    Allergies:  Allergies  Allergen Reactions   Codeine Nausea And Vomiting, Other (See Comments) and Anaphylaxis    nausea  Other reaction(s): Unknown   Naproxen Swelling, Shortness Of Breath and Rash   Avocado Other (See Comments)    Stomach pain, cause tongue and throat swelling Stomach pain, cause tongue and throat swelling   Sulfa Antibiotics Other (See Comments)    Gets boils   Sulfamethoxazole Other (See Comments)    Boils    Sulfasalazine Other (See Comments)    Gets boils   Fluconazole Palpitations    Swelling in mucosa  Swelling in mucosa   Nickel Rash   Ondansetron Itching    Other reaction(s): Unknown   Penicillins Other (See Comments) and Rash     Has patient had a PCN reaction causing immediate rash, facial/tongue/throat swelling, SOB or lightheadedness with hypotension: No Has patient had a PCN reaction causing severe rash involving mucus membranes or skin necrosis: No Has patient had a PCN reaction that required hospitalization No Has patient had a PCN reaction occurring within the last 10 years: No If all of the above answers are "NO", then may proceed with Cephalosporin use.   Has patient had a PCN  reaction causing immediate rash,  facial/tongue/throat swelling, SOB or lightheadedness with hypotension: No Has patient had a PCN reaction causing severe rash involving mucus membranes or skin necrosis: No Has patient had a PCN reaction that required hospitalization No Has patient had a PCN reaction occurring within the last 10 years: No If all of the above answers are "NO", then may proceed with Cephalosporin use. rash     Metabolic Disorder Labs: Lab Results  Component Value Date   HGBA1C 6.0 (H) 01/10/2021   MPG 126 01/10/2021   No results found for: PROLACTIN Lab Results  Component Value Date   CHOL 266 (H) 01/10/2021   TRIG 212 (H) 01/10/2021   HDL 59 01/10/2021   CHOLHDL 4.5 01/10/2021   LDLCALC 169 (H) 01/10/2021   Lab Results  Component Value Date   TSH 2.090 03/06/2020    Therapeutic Level Labs: No results found for: LITHIUM No results found for: VALPROATE No components found for:  CBMZ  Current Medications: Current Outpatient Medications  Medication Sig Dispense Refill   QUEtiapine (SEROQUEL) 50 MG tablet Take 1 tablet (50 mg total) by mouth at bedtime. 60 tablet 3   albuterol (VENTOLIN HFA) 108 (90 Base) MCG/ACT inhaler Inhale 2 puffs into the lungs every 6 (six) hours as needed for wheezing or shortness of breath. 18 g 2   dicyclomine (BENTYL) 20 MG tablet Take 1 tablet (20 mg total) by mouth 2 (two) times daily. 20 tablet 0   famotidine (PEPCID) 20 MG tablet Take 1 tablet (20 mg total) by mouth 2 (two) times daily. Needs OV for additional refills. 30 tablet 0   gabapentin (NEURONTIN) 300 MG capsule Take 1 capsule (300 mg total) by mouth 3 (three) times daily. 90 capsule 2   hydrOXYzine (ATARAX/VISTARIL) 50 MG tablet Take 1 tablet (50 mg total) by mouth 3 (three) times daily as needed. 90 tablet 2   ipratropium-albuterol (DUONEB) 0.5-2.5 (3) MG/3ML SOLN 1 nebule every 4 to 6 hrs prn wheezing 360 mL 2   lamoTRIgine (LAMICTAL) 25 MG tablet Take 1 tablet (25 mg  total) by mouth daily. 60 tablet 2   meclizine (ANTIVERT) 25 MG tablet Take 1 tablet (25 mg total) by mouth 3 (three) times daily as needed for dizziness. 30 tablet 2   metoCLOPramide (REGLAN) 10 MG tablet Take 1 tablet (10 mg total) by mouth every 6 (six) hours. 30 tablet 0   Multiple Vitamin (MULTIVITAMIN WITH MINERALS) TABS tablet Take 1 tablet by mouth daily.     omeprazole (PRILOSEC) 40 MG capsule Take 1 capsule (40 mg total) by mouth 2 (two) times daily. 60 capsule 0   pantoprazole (PROTONIX) 40 MG tablet Take 1 tablet (40 mg total) by mouth 2 (two) times daily before a meal. 60 tablet 0   PARoxetine (PAXIL) 40 MG tablet Take 1 tablet (40 mg total) by mouth daily. 30 tablet 2   promethazine (PHENERGAN) 12.5 MG tablet Take 1 tablet (12.5 mg total) by mouth every 8 (eight) hours as needed for nausea or vomiting. 20 tablet 0   QUEtiapine (SEROQUEL) 300 MG tablet Take 1 tablet (300 mg total) by mouth at bedtime. 90 tablet 3   traZODone (DESYREL) 150 MG tablet Take 1 tablet (150 mg total) by mouth at bedtime. 90 tablet 3   No current facility-administered medications for this visit.     Musculoskeletal: Strength & Muscle Tone:  Unable to assess due to telephone visit Gait & Station:  Unable to assess due to telephone visit Patient leans: N/A  Psychiatric  Specialty Exam: Review of Systems  Last menstrual period 03/21/2017.There is no height or weight on file to calculate BMI.  General Appearance:   Unable to assess due to telephone visit  Eye Contact:    Unable to assess due to telephone visit  Speech:  Clear and Coherent and Normal Rate  Volume:  Normal  Mood:  Anxious and Depressed  Affect:  Congruent  Thought Process:  Coherent, Goal Directed and Linear  Orientation:  Full (Time, Place, and Person)  Thought Content: Logical and Hallucinations: Auditory Visual   Suicidal Thoughts:  Yes.  without intent/plan  Homicidal Thoughts:  No  Memory:  Immediate;   Good Recent;    Good Remote;   Good  Judgement:  Good  Insight:  Fair  Psychomotor Activity:  Normal  Concentration:  Concentration: Good and Attention Span: Good  Recall:  Good  Fund of Knowledge: Good  Language: Good  Akathisia:  No  Handed:  Right  AIMS (if indicated): Not done  Assets:  Communication Skills Desire for Improvement Housing Intimacy Social Support  ADL's:  Intact  Cognition: WNL  Sleep:  Good   Screenings: GAD-7    Flowsheet Row Video Visit from 12/30/2020 in [facility] Video Visit from 11/28/2020 in [facility] Video Visit from 10/24/2020 in [facility] Video Visit from 07/29/2020 in [facility] Video Visit from 06/18/2020 in [facility]  Total GAD-7 Score 14 19 18 17 18       PHQ2-9    [place] Office Visit from 01/10/2021 in [facility] Visit from 12/30/2020 in [facility] Video Visit from 11/28/2020 in [facility] Video Visit from 10/24/2020 in [facility] Video Visit from 07/29/2020 in [place]  PHQ-2 Total Score 4 6 4 6 6   PHQ-9 Total Score 15 23 19 21 24       Flowsheet Row ED from 03/05/2021 in [facility] from 02/04/2021 in [place] Video Visit from 12/30/2020 in [place] No Risk Error: Question 6 not populated Error: Q7 should not be populated when Q6 is No        Assessment and Plan: Patient endorses symptoms of anxiety, depression, hypomania, and auditory/visiual hallucinations.  Notes that she never started Lamictal however is agreeable to starting Lamictal 25 mg for 2 weeks and then increasing to 50 mg to help stabilize mood.   Ativan not  refilled at this time patient notes that she has not taken it in 2 weeks.  She is agreeable to increasing Seroquel 300 mg to 350 mg.  She will continue all other medications as prescribed.  1. Bipolar 2 disorder, major depressive episode (HCC)  Continue- traZODone (DESYREL) 150 MG tablet; Take 1 tablet (150 mg total) by mouth at bedtime.  Dispense: 90 tablet; Refill: 3 Continue- QUEtiapine (SEROQUEL) 300 MG tablet; Take 1 tablet (300 mg total) by mouth at bedtime.  Dispense: 90 tablet; Refill: 3 Continue- PARoxetine (PAXIL) 40 MG tablet; Take 1 tablet (40 mg total) by mouth daily.  Dispense: 30 tablet; Refill: 2 Restart- lamoTRIgine (LAMICTAL) 25 MG tablet; Take 1 tablet (25 mg total) by mouth daily.  Dispense: 60 tablet; Refill: 2 Start- QUEtiapine (SEROQUEL) 50 MG tablet; Take 1 tablet (50 mg total) by mouth at  bedtime.  Dispense: 60 tablet; Refill: 3  2. Generalized anxiety disorder  Continue- QUEtiapine (SEROQUEL) 300 MG tablet; Take 1 tablet (300 mg total) by mouth at bedtime.  Dispense: 90 tablet; Refill: 3 Continue- PARoxetine (PAXIL) 40 MG tablet; Take 1 tablet (40 mg total) by mouth daily.  Dispense: 30 tablet; Refill: 2 Continue- hydrOXYzine (ATARAX/VISTARIL) 50 MG tablet; Take 1 tablet (50 mg total) by mouth 3 (three) times daily as needed.  Dispense: 90 tablet; Refill: 2 Start- QUEtiapine (SEROQUEL) 50 MG tablet; Take 1 tablet (50 mg total) by mouth at bedtime.  Dispense: 60 tablet; Refill: 3    Follow up in 1 months  Follow-up with therapy  Salley Slaughter, NP 04/09/2021, 1:16 PM

## 2021-05-06 ENCOUNTER — Other Ambulatory Visit: Payer: Self-pay

## 2021-05-19 ENCOUNTER — Ambulatory Visit: Payer: Medicare HMO | Admitting: Cardiology

## 2021-05-22 ENCOUNTER — Other Ambulatory Visit (HOSPITAL_COMMUNITY): Payer: Self-pay | Admitting: Psychiatry

## 2021-05-29 ENCOUNTER — Telehealth (HOSPITAL_COMMUNITY): Payer: Self-pay | Admitting: Psychiatry

## 2021-05-29 ENCOUNTER — Other Ambulatory Visit (HOSPITAL_COMMUNITY): Payer: Self-pay | Admitting: Psychiatry

## 2021-05-29 DIAGNOSIS — F3181 Bipolar II disorder: Secondary | ICD-10-CM

## 2021-05-29 MED ORDER — TRAZODONE HCL 100 MG PO TABS: 200.0000 mg | ORAL_TABLET | Freq: Every day | ORAL | 3 refills | Status: DC
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
## 2021-06-05 ENCOUNTER — Telehealth (HOSPITAL_COMMUNITY): Payer: Medicare HMO | Admitting: Psychiatry

## 2021-06-06 ENCOUNTER — Other Ambulatory Visit (HOSPITAL_COMMUNITY): Payer: Self-pay | Admitting: Psychiatry

## 2021-06-11 ENCOUNTER — Other Ambulatory Visit (HOSPITAL_COMMUNITY): Payer: Self-pay | Admitting: Psychiatry

## 2021-07-02 ENCOUNTER — Other Ambulatory Visit (HOSPITAL_COMMUNITY): Payer: Self-pay | Admitting: Psychiatry

## 2021-07-02 DIAGNOSIS — F411 Generalized anxiety disorder: Secondary | ICD-10-CM

## 2021-07-02 DIAGNOSIS — F3181 Bipolar II disorder: Secondary | ICD-10-CM

## 2021-07-10 ENCOUNTER — Emergency Department (HOSPITAL_BASED_OUTPATIENT_CLINIC_OR_DEPARTMENT_OTHER): Payer: Medicare HMO

## 2021-07-10 ENCOUNTER — Other Ambulatory Visit: Payer: Self-pay

## 2021-07-10 ENCOUNTER — Emergency Department (HOSPITAL_BASED_OUTPATIENT_CLINIC_OR_DEPARTMENT_OTHER)
Admission: EM | Admit: 2021-07-10 | Discharge: 2021-07-10 | Disposition: A | Discharge status: Home / Self Care | Payer: Medicare HMO | Attending: Emergency Medicine | Admitting: Emergency Medicine

## 2021-07-10 ENCOUNTER — Encounter (HOSPITAL_BASED_OUTPATIENT_CLINIC_OR_DEPARTMENT_OTHER): Payer: Self-pay

## 2021-07-10 ENCOUNTER — Telehealth (HOSPITAL_BASED_OUTPATIENT_CLINIC_OR_DEPARTMENT_OTHER): Payer: Self-pay | Admitting: Student

## 2021-07-10 DIAGNOSIS — J4520 Mild intermittent asthma, uncomplicated: Secondary | ICD-10-CM | POA: Insufficient documentation

## 2021-07-10 DIAGNOSIS — R0602 Shortness of breath: Secondary | ICD-10-CM | POA: Diagnosis present

## 2021-07-10 DIAGNOSIS — I10 Essential (primary) hypertension: Secondary | ICD-10-CM | POA: Diagnosis not present

## 2021-07-10 DIAGNOSIS — J069 Acute upper respiratory infection, unspecified: Secondary | ICD-10-CM

## 2021-07-10 DIAGNOSIS — R0789 Other chest pain: Secondary | ICD-10-CM | POA: Insufficient documentation

## 2021-07-10 DIAGNOSIS — R5381 Other malaise: Secondary | ICD-10-CM | POA: Insufficient documentation

## 2021-07-10 DIAGNOSIS — R0982 Postnasal drip: Secondary | ICD-10-CM

## 2021-07-10 DIAGNOSIS — R519 Headache, unspecified: Secondary | ICD-10-CM | POA: Insufficient documentation

## 2021-07-10 DIAGNOSIS — Z79899 Other long term (current) drug therapy: Secondary | ICD-10-CM | POA: Diagnosis not present

## 2021-07-10 DIAGNOSIS — R051 Acute cough: Secondary | ICD-10-CM

## 2021-07-10 LAB — CBC WITH DIFFERENTIAL/PLATELET
Abs Immature Granulocytes: 0.05 10*3/uL (ref 0.00–0.07)
Basophils Absolute: 0 10*3/uL (ref 0.0–0.1)
Basophils Relative: 1 %
Eosinophils Absolute: 0.2 10*3/uL (ref 0.0–0.5)
Eosinophils Relative: 2 %
HCT: 36.2 % (ref 36.0–46.0)
Hemoglobin: 12 g/dL (ref 12.0–15.0)
Immature Granulocytes: 1 %
Lymphocytes Relative: 32 %
Lymphs Abs: 2.3 10*3/uL (ref 0.7–4.0)
MCH: 27.6 pg (ref 26.0–34.0)
MCHC: 33.1 g/dL (ref 30.0–36.0)
MCV: 83.4 fL (ref 80.0–100.0)
Monocytes Absolute: 0.4 10*3/uL (ref 0.1–1.0)
Monocytes Relative: 6 %
Neutro Abs: 4.3 10*3/uL (ref 1.7–7.7)
Neutrophils Relative %: 58 %
Platelets: 345 10*3/uL (ref 150–400)
RBC: 4.34 MIL/uL (ref 3.87–5.11)
RDW: 14.9 % (ref 11.5–15.5)
WBC: 7.3 10*3/uL (ref 4.0–10.5)
nRBC: 0 % (ref 0.0–0.2)

## 2021-07-10 LAB — BASIC METABOLIC PANEL
Anion gap: 9 (ref 5–15)
BUN: 16 mg/dL (ref 6–20)
CO2: 24 mmol/L (ref 22–32)
Calcium: 9.2 mg/dL (ref 8.9–10.3)
Chloride: 103 mmol/L (ref 98–111)
Creatinine, Ser: 0.65 mg/dL (ref 0.44–1.00)
GFR, Estimated: >60 mL/min (ref >60)
Glucose, Bld: 218 mg/dL — ABNORMAL HIGH (ref 70–99)
Potassium: 3.3 mmol/L — ABNORMAL LOW (ref 3.5–5.1)
Sodium: 136 mmol/L (ref 135–145)

## 2021-07-10 LAB — TROPONIN I (HIGH SENSITIVITY): Troponin I (High Sensitivity): <2 ng/L (ref <18)

## 2021-07-10 IMAGING — DX DG CHEST 2V
2 series · 2 of 2 positions shown · non-contrast
Comparison: None.

CLINICAL DATA: Cough and congestion

EXAM:
CHEST - 2 VIEW

[chest pa]
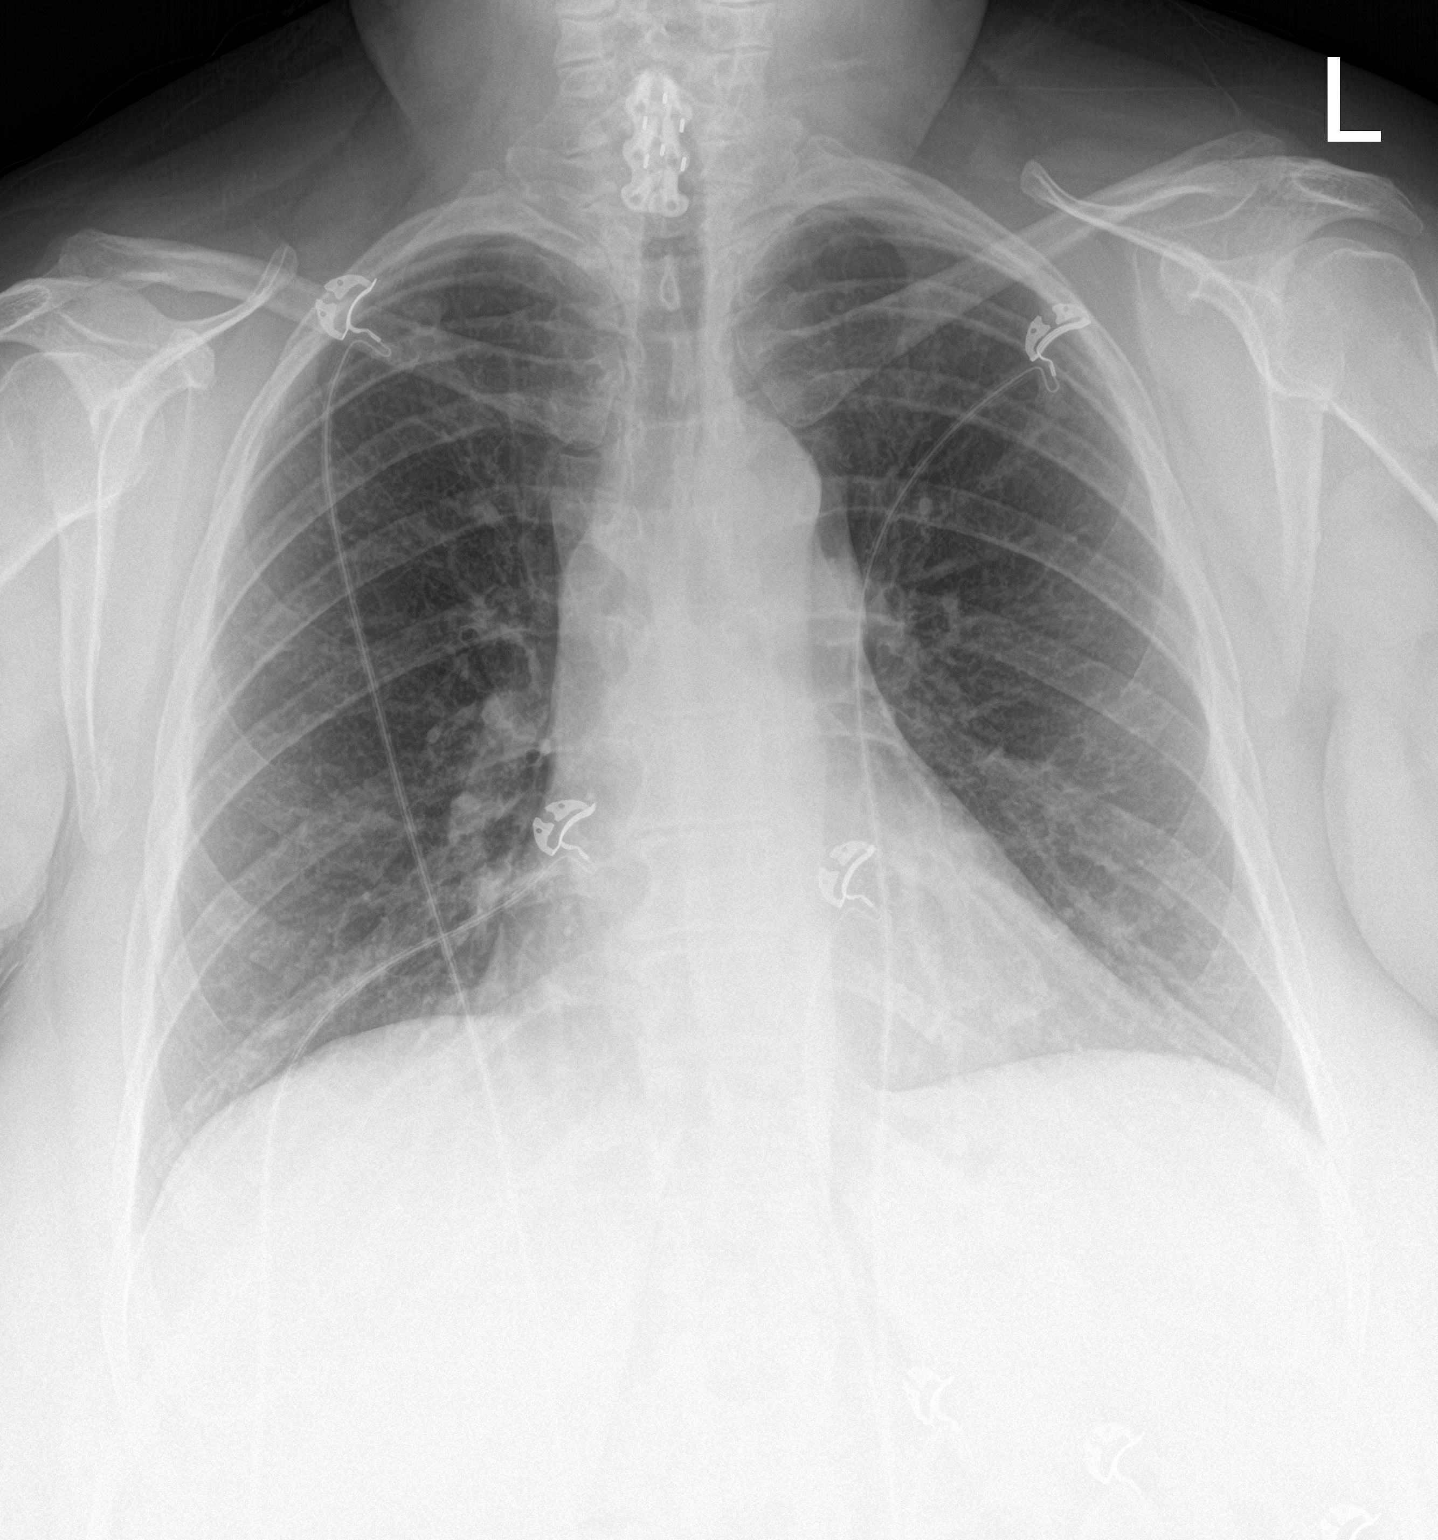

[chest lat]
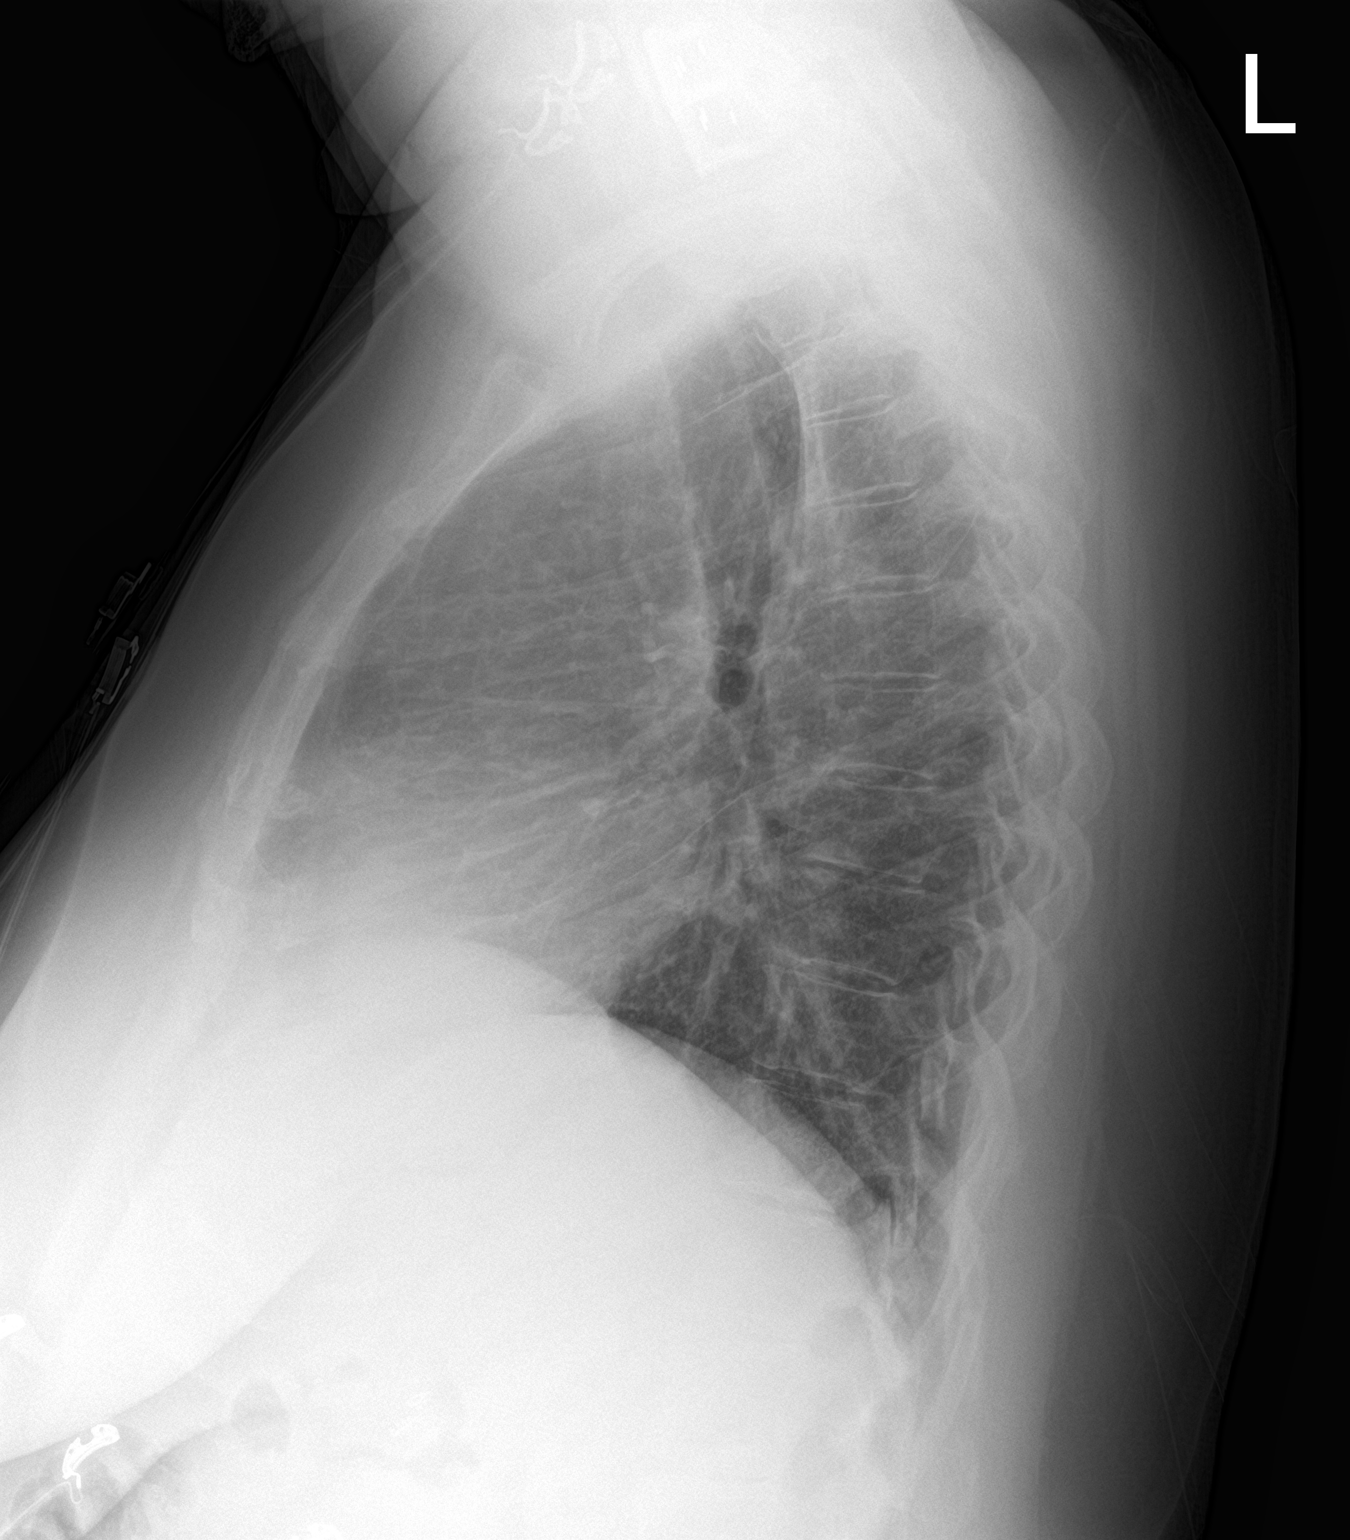

[2 of 2 positions shown; findings below may reference images not displayed]

FINDINGS: Normal mediastinum and cardiac silhouette. Normal pulmonary
vasculature. No evidence of effusion, infiltrate, or pneumothorax.
No acute bony abnormality. Anterior cervical fusion
IMPRESSION: No acute cardiopulmonary process.

## 2021-07-10 MED ORDER — GUAIFENESIN 100 MG/5ML PO LIQD
5.0000 mL | Freq: Once | ORAL | Status: AC
  Administered 2021-07-10: 10:00:00 5 mL via ORAL

## 2021-07-10 MED ORDER — IPRATROPIUM-ALBUTEROL 0.5-2.5 (3) MG/3ML IN SOLN
3.0000 mL | Freq: Once | RESPIRATORY_TRACT | Status: AC
  Administered 2021-07-10: 10:00:00 3 mL via RESPIRATORY_TRACT
  Filled 2021-07-10: qty 3

## 2021-07-10 MED ORDER — ACETAMINOPHEN-CODEINE 300-30 MG PO TABS: 1.0000 | ORAL_TABLET | Freq: Four times a day (QID) | ORAL | 0 refills | Status: DC | PRN

## 2021-07-10 MED ORDER — FLUTICASONE PROPIONATE 50 MCG/ACT NA SUSP
1.0000 | Freq: Every day | NASAL | Status: DC
Start: 2021-07-10 — End: 2021-07-10
  Administered 2021-07-10: 10:00:00 1 via NASAL
  Filled 2021-07-10: qty 16

## 2021-07-10 MED ORDER — IPRATROPIUM-ALBUTEROL 0.5-2.5 (3) MG/3ML IN SOLN
3.0000 mL | Freq: Once | RESPIRATORY_TRACT | Status: AC
  Administered 2021-07-10: 11:00:00 3 mL via RESPIRATORY_TRACT
  Filled 2021-07-10: qty 3

## 2021-07-10 MED ORDER — GUAIFENESIN 100 MG/5ML PO LIQD: 5.0000 mL | Freq: Once | ORAL | Status: DC

## 2021-07-10 NOTE — Discharge Instructions (Addendum)
Use flonase at night for up to 5 nights to help with postnasal drip.

## 2021-07-10 NOTE — ED Notes (Signed)
Speaking in complete sentences, RLL rhonchi, SpO2 96% on r/a, taking MDI without releif.

## 2021-07-10 NOTE — Telephone Encounter (Signed)
Patient unable to have neb filled. Needs neb for SOB.
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
## 2021-07-10 NOTE — ED Notes (Signed)
Pt walked from registration to room with O2 sats at 96-97%.

## 2021-07-10 NOTE — ED Triage Notes (Addendum)
Pt arrives via POV. Pt reports 2 weeks of having an URI. Pt reports worsening cough, intermittent chest tightness and sob with cough.

## 2021-07-24 ENCOUNTER — Ambulatory Visit (INDEPENDENT_AMBULATORY_CARE_PROVIDER_SITE_OTHER): Payer: Medicare HMO | Admitting: Clinical

## 2021-07-24 DIAGNOSIS — F3181 Bipolar II disorder: Secondary | ICD-10-CM
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
## 2021-07-26 NOTE — Plan of Care (Signed)
Client was in agreement with plan.

## 2021-08-05 ENCOUNTER — Telehealth (HOSPITAL_COMMUNITY): Payer: Medicare HMO | Admitting: Psychiatry

## 2021-08-05 ENCOUNTER — Encounter (HOSPITAL_COMMUNITY): Payer: Self-pay

## 2021-08-19 ENCOUNTER — Telehealth (HOSPITAL_COMMUNITY): Payer: Self-pay | Admitting: Clinical

## 2021-08-19 NOTE — Telephone Encounter (Signed)
Therapist attempted to call the client by telephone to inform her of available time to meet for therapy session today.  Client did not answer the phone.  Therapist left a voicemail with office phone number for the client to call back.

## 2021-09-03 ENCOUNTER — Telehealth (INDEPENDENT_AMBULATORY_CARE_PROVIDER_SITE_OTHER): Payer: Medicare HMO | Admitting: Psychiatry

## 2021-09-03 ENCOUNTER — Encounter (HOSPITAL_COMMUNITY): Payer: Self-pay | Admitting: Psychiatry

## 2021-09-03 DIAGNOSIS — F3181 Bipolar II disorder: Secondary | ICD-10-CM | POA: Diagnosis not present

## 2021-09-03 DIAGNOSIS — F411 Generalized anxiety disorder: Secondary | ICD-10-CM

## 2021-09-03 MED ORDER — PAROXETINE HCL 20 MG PO TABS: 20.0000 mg | ORAL_TABLET | Freq: Every day | ORAL | 3 refills | Status: DC

## 2021-09-03 MED ORDER — QUETIAPINE FUMARATE 150 MG PO TABS: 150.0000 mg | ORAL_TABLET | Freq: Every day | ORAL | 3 refills | Status: DC

## 2021-09-03 MED ORDER — LORAZEPAM 0.5 MG PO TABS: 0.5000 mg | ORAL_TABLET | Freq: Every day | ORAL | 1 refills | Status: AC

## 2021-09-03 MED ORDER — TRAZODONE HCL 100 MG PO TABS: 100.0000 mg | ORAL_TABLET | Freq: Every day | ORAL | 3 refills | Status: DC

## 2021-09-03 NOTE — Progress Notes (Signed)
BH MD/PA/NP OP Progress Note Virtual Visit via Video Note  I connected with Casey Jordan on 09/03/21 at 11:00 AM EST by a video enabled telemedicine application and verified that I am speaking with the correct person using two identifiers.  Location: Patient: Home Provider: Clinic   I discussed the limitations of evaluation and management by telemedicine and the availability of in person appointments. The patient expressed understanding and agreed to proceed.  I provided 30 minutes of non-face-to-face time during this encounter.              09/03/2021 11:52 AM Casey Jordan   MRN:  [id number]  Chief Complaint: "I tapered down on all my medications"  HPI: 56  year old female seen today for follow up psychiatric evaluation. She has a psychiatric history of anxiety, depression,bipolar disorder, and PTSD. She is currently managed on Ativan 0.5 mg twice daily, prazosin 1 mg nightly, hydroxyzine 25 mg 3 times daily as needed, trazodone 150 mg nightly Paxil 40 mg daily, Lamictal 50 mg daily, and Seroquel 350 mg nightly. She noted that she discontinued prazosin, and hydroxyzine, and Lamictal. She also has cut all of her other medications in half. She reports that she feels mentally stable.   Today she was well groomed, pleasant, cooperative, and engaged in conversation. She notes that since her last visit she has tapered down on all of her medications. She notes that she only Paxil 20 mg, Seroquel 175 mg,  trazodone 75 mg and she notes that she discontinued prazosin/lamictal/hydroxyzine.  She informed provider that she does not take Ativan regularly.  With these reductions he notes that she feels mentally stable. She notes that she has been trying to stay away from negativity. She repots that she is cutting certain people out of her life. She also notes that her son is now in rehab and doing well and is currently sober.   Patient notes that she has been taking monjuro injections for  weight loss. She reports that over 5 weeks she lost 11 pounds. She also notes that her pain has been better. She is followed by an orthopedic specialist and notes that she may be a candidate for ablation treatments and will follow up in March for further evaluation.   Since her last visit she reports that her anxiety and depression has improved.  Provider conducted a GAD-7 and patient scored a 16.  Provider also conducted PHQ-9 and patient scored a 9.  She endorses adequate sleep and appetite.  She denies SI/HI/VH or paranoia.  Patient notes that when she is under a lot of stress she may have a hallucination but notes that this has not happened in months.  At times her mood fluctuates but denies other symptoms of mania.  Provider informed patient that reduction in medication can lead to a hypomanic/hyper manic state.  She endorsed understanding however notes that she would like to keep her medications reduced.  Today Paxil reduced to 20 mg, Seroquel reduced to 150 mg, trazodone reduced to 100 mg.  At this time patient does not want to restart prazosin, Lamictal, hydroxyzine.  Ativan too was reduced to 0.5 mg once daily instead of 0.5 twice daily.  No other concerns noted at this time.         Visit Diagnosis:    ICD-10-CM   1. GAD (generalized anxiety disorder)  F41.1 LORazepam (ATIVAN) 0.5 MG tablet    PARoxetine (PAXIL) 20 MG tablet    2. Bipolar 2 disorder, major  depressive episode (HCC)  F31.81 QUEtiapine 150 MG TABS    traZODone (DESYREL) 100 MG tablet      Past Psychiatric History: Bipolar 2, anxiety, depression, and PTSD.  Past Medical History:  Past Medical History:  Diagnosis Date   Anemia    Asthma    Bipolar 2 disorder, major depressive episode ([place]) 11/28/2020   Cardiac murmur 04/30/2020   Depression    Dizziness 07/05/2018   Formatting of this note might be different from the original. Onset December 2019.  MRI of the brain okay.  Last Assessment & Plan:  Formatting of  this note might be different from the original. Complex disease history.  Onset back in December.  Since then she has experienced syncopal episodes on at least 2 occasions.  Frequent spinning episodes and almost constant unsteadiness that is improved wi   DVT (deep venous thrombosis) (HCC)    lower leg while traveling went to lung   Fatty liver 03/14/2021   GAD (generalized anxiety disorder) 08/06/2017   Generalized anxiety disorder 08/06/2017   GERD (gastroesophageal reflux disease)    Headache    Migraines   History of deep venous thrombosis (DVT) of distal vein of right lower extremity 12/21/2016   Formatting of this note might be different from the original. Anticoagulation x 6 months; no recurrence.  Dx 2010 Formatting of this note might be different from the original. Overview:  Anticoagulation x 6 months; no recurrence.  Dx 2010   History of fusion of cervical spine 12/21/2016   Formatting of this note might be different from the original. 2011-2012 Formatting of this note might be different from the original. Overview:  2011-2012   History of kidney stones    Increased liver enzymes 03/14/2021   Left ureteral calculus 11/18/2017   Major depressive disorder, recurrent episode, moderate ([place]) 12/28/2019   Major depressive disorder, single episode, severe with psychotic features ([place]) 06/18/2020   Mild intermittent asthma without complication 08/31/9415   Mild renal insufficiency 03/14/2021   Mixed hyperlipidemia 06/29/2017   Formatting of this note might be different from the original. 10 year CVD risk 5%   Morbid obesity ([place]) 04/30/2020   Nausea and vomiting 06/25/2018   Obesity (BMI 30-39.9) 12/21/2016   Post-traumatic stress disorder, unspecified 12/21/2018   Posttraumatic stress disorder 06/18/2020   Prediabetes 03/14/2021   Preop cardiovascular exam 04/30/2020   Restless leg syndrome 12/21/2016   S/P cholecystectomy 03/14/2021   Severe recurrent major depression without psychotic features ([place])  01/05/2013   Formatting of this note might be different from the original. 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 06/2018: Noted on [place] RUE   Uterine fibroid 03/14/2021   Formatting of this note might be different from the original. 02/2021: Stable 4 cm posterior fundal fibroid   Vaginal Pap smear, abnormal    Vitamin D insufficiency     Past Surgical History:  Procedure Laterality Date   CHOLECYSTECTOMY     COLONOSCOPY     COLPOSCOPY     HYSTEROSCOPY WITH D & C N/A 04/02/2017   Procedure: DILATATION AND CURETTAGE /HYSTEROSCOPY;  Surgeon: Linda Hedges, DO;  Location: [facility];  Service: Gynecology;  Laterality: N/A;   KIDNEY STONE SURGERY     OVARY SURGERY     POSTERIOR FUSION CERVICAL SPINE     TONSILLECTOMY      Family Psychiatric History:  Unknown  Family History:  Family History  Problem Relation Age of Onset   Kidney disease Mother    COPD Mother    Alcohol abuse Mother    Diabetes Mother    Hyperlipidemia Father    Hypertension Father    Hearing loss Father    Alcohol abuse Father    Diabetes Father    Heart disease Father    Kidney disease Father    COPD Sister    Hearing loss Brother    Birth defects Brother    Cancer Brother    Alcohol abuse Brother    Depression Son    Alcohol abuse Son    Alcohol abuse Maternal Grandmother    Stroke Maternal Grandfather    Anxiety disorder Maternal Grandfather    Miscarriages / Stillbirths Paternal Grandmother    Early death Paternal Grandmother    Diabetes Paternal Grandfather     Social History:  Social History   Socioeconomic History   Marital status: Divorced    Spouse name: Not on file   Number of children: Not on file   Years of education: Not on file   Highest education level: Not on file  Occupational History   Not on file  Tobacco Use   Smoking status: Never    Smokeless tobacco: Never  Vaping Use   Vaping Use: Some days  Substance and Sexual Activity   Alcohol use: Not Currently   Drug use: Never   Sexual activity: Not on file  Other Topics Concern   Not on file  Social History Narrative   Not on file   Social Determinants of Health   Financial Resource Strain: Not on file  Food Insecurity: Not on file  Transportation Needs: Not on file  Physical Activity: Not on file  Stress: Not on file  Social Connections: Not on file    Allergies:  Allergies  Allergen Reactions   Naproxen Swelling, Shortness Of Breath and Rash   Avocado Swelling        Sulfa Antibiotics Other (See Comments)    Gets boils   Sulfamethoxazole Other (See Comments)    Boils    Sulfasalazine Other (See Comments)    Gets boils   Fluconazole Palpitations    Swelling in mucosa  Swelling in mucosa   Nickel Rash   Ondansetron Itching    Other reaction(s): Unknown   Penicillins Other (See Comments) and Rash     Has patient had a PCN reaction causing immediate rash, facial/tongue/throat swelling, SOB or lightheadedness with hypotension: No Has patient had a PCN reaction causing severe rash involving mucus membranes or skin necrosis: No Has patient had a PCN reaction that required hospitalization No Has patient had a PCN reaction occurring within the last 10 years: No If all of the above answers are "NO", then may proceed with Cephalosporin use.   Has patient had a PCN reaction causing immediate rash, facial/tongue/throat swelling, SOB or lightheadedness with hypotension: No Has patient had a PCN reaction causing severe rash involving mucus membranes or skin necrosis: No Has patient had a PCN reaction that required hospitalization No Has patient had a PCN reaction occurring within the last 10 years: No If all of the above answers are "NO", then may proceed with Cephalosporin use. rash     Metabolic Disorder Labs: Lab Results  Component Value Date    HGBA1C 6.0 (H) 01/10/2021   MPG 126 01/10/2021   No results found for: PROLACTIN Lab Results  Component Value Date  CHOL 266 (H) 01/10/2021   TRIG 212 (H) 01/10/2021   HDL 59 01/10/2021   CHOLHDL 4.5 01/10/2021   LDLCALC 169 (H) 01/10/2021   Lab Results  Component Value Date   TSH 2.090 03/06/2020    Therapeutic Level Labs: No results found for: LITHIUM No results found for: VALPROATE No components found for:  CBMZ  Current Medications: Current Outpatient Medications  Medication Sig Dispense Refill   PARoxetine (PAXIL) 20 MG tablet Take 1 tablet (20 mg total) by mouth daily. 30 tablet 3   Acetaminophen-Codeine (TYLENOL/CODEINE #3) 300-30 MG tablet Take 1 tablet by mouth every 6 (six) hours as needed for up to 10 doses for pain. 10 tablet 0   albuterol (VENTOLIN HFA) 108 (90 Base) MCG/ACT inhaler Inhale 2 puffs into the lungs every 6 (six) hours as needed for wheezing or shortness of breath. 18 g 2   dicyclomine (BENTYL) 20 MG tablet Take 1 tablet (20 mg total) by mouth 2 (two) times daily. 20 tablet 0   famotidine (PEPCID) 20 MG tablet Take 1 tablet (20 mg total) by mouth 2 (two) times daily. Needs OV for additional refills. 30 tablet 0   gabapentin (NEURONTIN) 300 MG capsule Take 300 mg by mouth 3 (three) times daily.     LORazepam (ATIVAN) 0.5 MG tablet Take 1 tablet (0.5 mg total) by mouth daily. 30 tablet 1   meclizine (ANTIVERT) 25 MG tablet Take 1 tablet (25 mg total) by mouth 3 (three) times daily as needed for dizziness. 30 tablet 2   metoCLOPramide (REGLAN) 10 MG tablet Take 1 tablet (10 mg total) by mouth every 6 (six) hours. 30 tablet 0   Multiple Vitamin (MULTIVITAMIN WITH MINERALS) TABS tablet Take 1 tablet by mouth daily.     omeprazole (PRILOSEC) 40 MG capsule Take 1 capsule (40 mg total) by mouth 2 (two) times daily. 60 capsule 0   pantoprazole (PROTONIX) 40 MG tablet Take 1 tablet (40 mg total) by mouth 2 (two) times daily before a meal. 60 tablet 0    promethazine (PHENERGAN) 12.5 MG tablet Take 1 tablet (12.5 mg total) by mouth every 8 (eight) hours as needed for nausea or vomiting. 20 tablet 0   QUEtiapine 150 MG TABS Take 150 mg by mouth at bedtime. 30 tablet 3   traZODone (DESYREL) 100 MG tablet Take 1 tablet (100 mg total) by mouth at bedtime. 30 tablet 3   No current facility-administered medications for this visit.     Musculoskeletal: Strength & Muscle Tone:  Unable to assess due to telehealth visit [place]:  Unable to assess due to telehealth visit Patient leans: N/A  Psychiatric Specialty Exam: Review of Systems  Last menstrual period 03/21/2017.There is no height or weight on file to calculate BMI.  General Appearance: Well Groomed  Eye Contact:  Good  Speech:  Clear and Coherent and Normal Rate  Volume:  Normal  Mood:  Anxious  Affect:  Congruent  Thought Process:  Coherent, Goal Directed and Linear  Orientation:  Full (Time, Place, and Person)  Thought Content: WDL and Logical   Suicidal Thoughts:  No  Homicidal Thoughts:  No  Memory:  Immediate;   Good Recent;   Good Remote;   Good  Judgement:  Good  Insight:  Good  Psychomotor Activity:  Normal  Concentration:  Concentration: Good and Attention Span: Good  Recall:  Good  Fund of Knowledge: Good  Language: Good  Akathisia:  No  Handed:  Right  AIMS (  if indicated): Not done  Assets:  Communication Skills Desire for Improvement Housing Intimacy Social Support  ADL's:  Intact  Cognition: WNL  Sleep:  Good   Screenings: GAD-7    Flowsheet Row Video Visit from 09/03/2021 in [facility] Video Visit from 12/30/2020 in [facility] Video Visit from 11/28/2020 in [facility] Video Visit from 10/24/2020 in [facility] Video Visit from 07/29/2020 in [facility]  Total GAD-7 Score 16 14 19 18 17       PHQ2-9     Flowsheet Row Video Visit from 09/03/2021 in [facility] Office Visit from 01/10/2021 in [facility] Visit from 12/30/2020 in [facility] Video Visit from 11/28/2020 in [facility] Video Visit from 10/24/2020 in [place]  PHQ-2 Total Score 2 4 6 4 6   PHQ-9 Total Score 9 15 23 19 21       Flowsheet Row Video Visit from 09/03/2021 in [facility] ED from 07/10/2021 in [place] ED from 03/05/2021 in [place] CATEGORY Error: Q7 should not be populated when Q6 is No No Risk No Risk        Assessment and Plan: Patient that her mood, anxiety, and depression has improved since reducing medications and discontinuing other medications.   Patient request that medications continue to be reduced.  She will take trazodone 100 mg nightly, Seroquel 150 mg nightly, and Paxil 20 mg nightly.  At this time she does not want to continue Lamictal, hydroxyzine, or prazosin.   1. Bipolar 2 disorder, major depressive episode (HCC)  Reduced- QUEtiapine 150 MG TABS; Take 150 mg by mouth at bedtime.  Dispense: 30 tablet; Refill: 3 Reduced- traZODone (DESYREL) 100 MG tablet; Take 1 tablet (100 mg total) by mouth at bedtime.  Dispense: 30 tablet; Refill: 3  2. GAD (generalized anxiety disorder)  Reduced- LORazepam (ATIVAN) 0.5 MG tablet; Take 1 tablet (0.5 mg total) by mouth daily.  Dispense: 30 tablet; Refill: 1 Reduced- PARoxetine (PAXIL) 20 MG tablet; Take 1 tablet (20 mg total) by mouth daily.  Dispense: 30 tablet; Refill: 3     Follow up in 3 months  Follow-up with therapy  Salley Slaughter, NP 09/03/2021, 11:52 AM

## 2021-09-05 ENCOUNTER — Emergency Department (HOSPITAL_BASED_OUTPATIENT_CLINIC_OR_DEPARTMENT_OTHER)
Admission: EM | Admit: 2021-09-05 | Discharge: 2021-09-05 | Disposition: A | Discharge status: Home / Self Care | Payer: Medicare HMO | Attending: Emergency Medicine | Admitting: Emergency Medicine

## 2021-09-05 ENCOUNTER — Emergency Department (HOSPITAL_BASED_OUTPATIENT_CLINIC_OR_DEPARTMENT_OTHER): Payer: Medicare HMO

## 2021-09-05 ENCOUNTER — Other Ambulatory Visit: Payer: Self-pay

## 2021-09-05 ENCOUNTER — Encounter (HOSPITAL_BASED_OUTPATIENT_CLINIC_OR_DEPARTMENT_OTHER): Payer: Self-pay | Admitting: Emergency Medicine

## 2021-09-05 DIAGNOSIS — R456 Violent behavior: Secondary | ICD-10-CM | POA: Diagnosis not present

## 2021-09-05 DIAGNOSIS — R1013 Epigastric pain: Secondary | ICD-10-CM | POA: Diagnosis present

## 2021-09-05 DIAGNOSIS — J45909 Unspecified asthma, uncomplicated: Secondary | ICD-10-CM | POA: Insufficient documentation

## 2021-09-05 DIAGNOSIS — R1012 Left upper quadrant pain: Secondary | ICD-10-CM | POA: Insufficient documentation

## 2021-09-05 DIAGNOSIS — R1010 Upper abdominal pain, unspecified: Secondary | ICD-10-CM | POA: Insufficient documentation

## 2021-09-05 DIAGNOSIS — F419 Anxiety disorder, unspecified: Secondary | ICD-10-CM | POA: Insufficient documentation

## 2021-09-05 LAB — COMPREHENSIVE METABOLIC PANEL
ALT: 34 U/L (ref 0–44)
AST: 33 U/L (ref 15–41)
Albumin: 4.1 g/dL (ref 3.5–5.0)
Alkaline Phosphatase: 100 U/L (ref 38–126)
Anion gap: 11 (ref 5–15)
BUN: 15 mg/dL (ref 6–20)
CO2: 20 mmol/L — ABNORMAL LOW (ref 22–32)
Calcium: 9.6 mg/dL (ref 8.9–10.3)
Chloride: 106 mmol/L (ref 98–111)
Creatinine, Ser: 0.89 mg/dL (ref 0.44–1.00)
GFR, Estimated: >60 mL/min (ref >60)
Glucose, Bld: 114 mg/dL — ABNORMAL HIGH (ref 70–99)
Potassium: 3.6 mmol/L (ref 3.5–5.1)
Sodium: 137 mmol/L (ref 135–145)
Total Bilirubin: 0.5 mg/dL (ref 0.3–1.2)
Total Protein: 7.8 g/dL (ref 6.5–8.1)

## 2021-09-05 LAB — CBC WITH DIFFERENTIAL/PLATELET
Abs Immature Granulocytes: 0.03 10*3/uL (ref 0.00–0.07)
Basophils Absolute: 0 10*3/uL (ref 0.0–0.1)
Basophils Relative: 0 %
Eosinophils Absolute: 0 10*3/uL (ref 0.0–0.5)
Eosinophils Relative: 0 %
HCT: 41.1 % (ref 36.0–46.0)
Hemoglobin: 14.1 g/dL (ref 12.0–15.0)
Immature Granulocytes: 0 %
Lymphocytes Relative: 13 %
Lymphs Abs: 1.3 10*3/uL (ref 0.7–4.0)
MCH: 28.8 pg (ref 26.0–34.0)
MCHC: 34.3 g/dL (ref 30.0–36.0)
MCV: 83.9 fL (ref 80.0–100.0)
Monocytes Absolute: 0.4 10*3/uL (ref 0.1–1.0)
Monocytes Relative: 4 %
Neutro Abs: 8.4 10*3/uL — ABNORMAL HIGH (ref 1.7–7.7)
Neutrophils Relative %: 83 %
Platelets: 368 10*3/uL (ref 150–400)
RBC: 4.9 MIL/uL (ref 3.87–5.11)
RDW: 14.6 % (ref 11.5–15.5)
WBC: 10.2 10*3/uL (ref 4.0–10.5)
nRBC: 0 % (ref 0.0–0.2)

## 2021-09-05 IMAGING — CT CT RENAL STONE PROTOCOL
2 of 4 series · 16 of 46 positions shown, 18 images · non-contrast
Comparison: [REDACTED] CT Abdomen and Pelvis
[DATE], and earlier.

CLINICAL DATA: 55-year-old female with abdominal pain, flank pain
and vomiting. Mild renal insufficiency.



[Series 2: axial st · axial · 0.96mm/px · z∈[+668,+1098]mm · 13 of 94 slices shown, 15 images]
[im 4/94  soft-tissue]
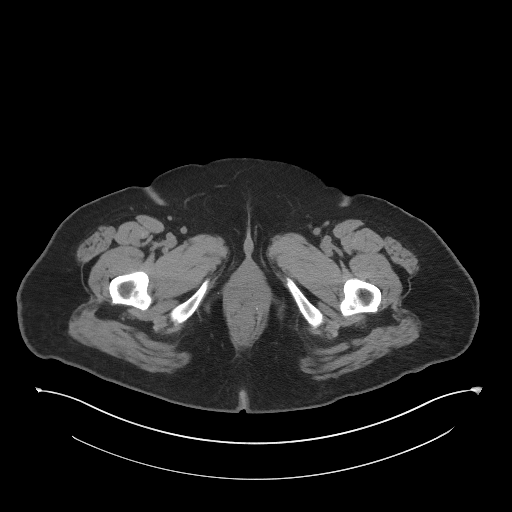
[im 4/94  bone]
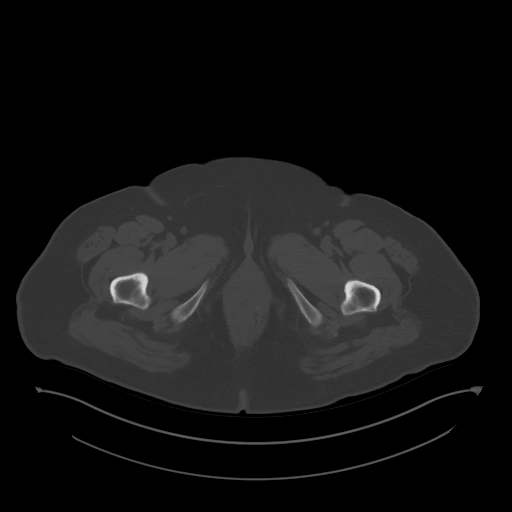
[im 12/94  soft-tissue]
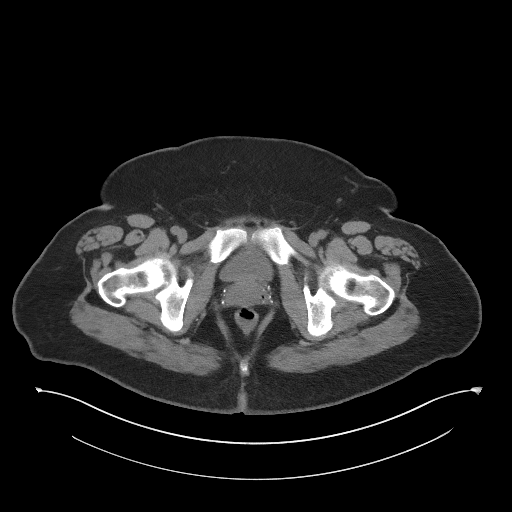
[im 20/94  soft-tissue]
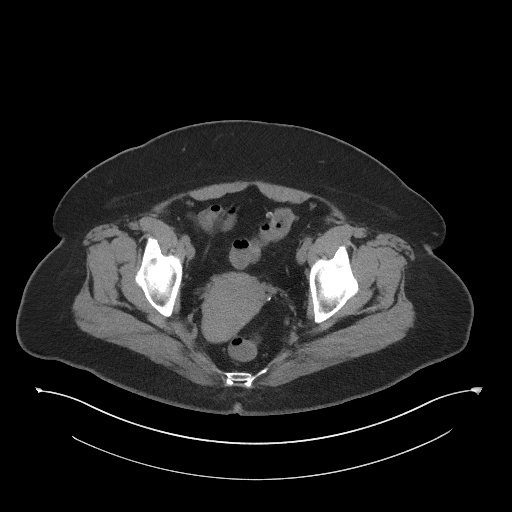
[im 28/94  soft-tissue]
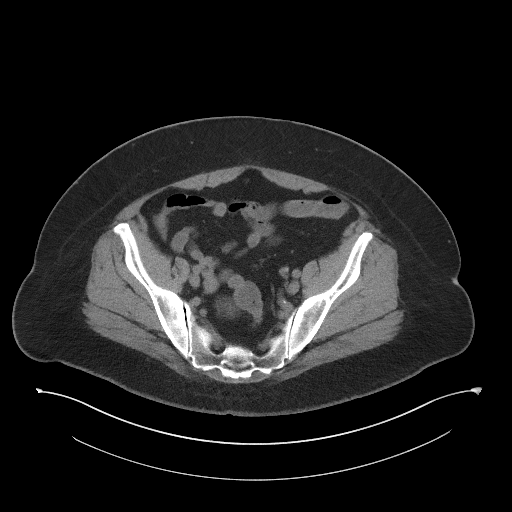
[im 32/94  soft-tissue]
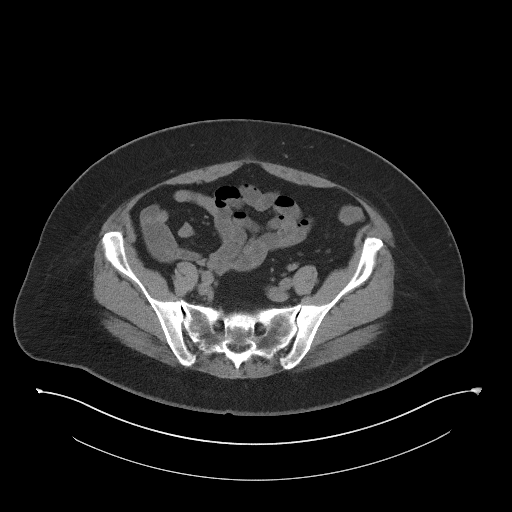
[im 39/94  soft-tissue]
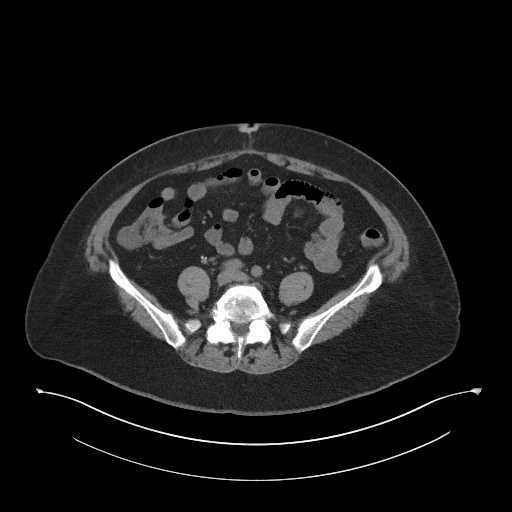
[im 47/94  soft-tissue]
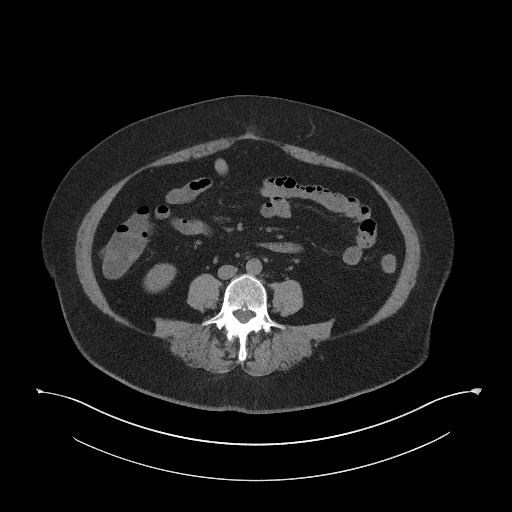
[im 55/94  soft-tissue]
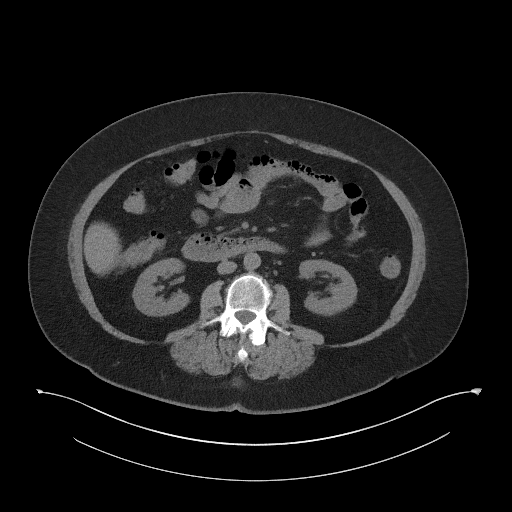
[im 63/94  soft-tissue]
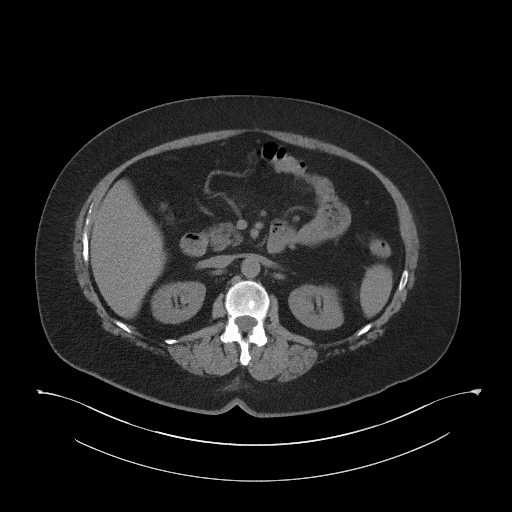
[im 63/94  bone]
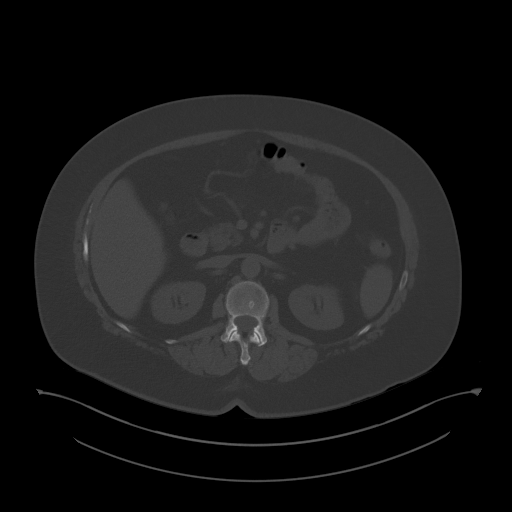
[im 66/94  soft-tissue]
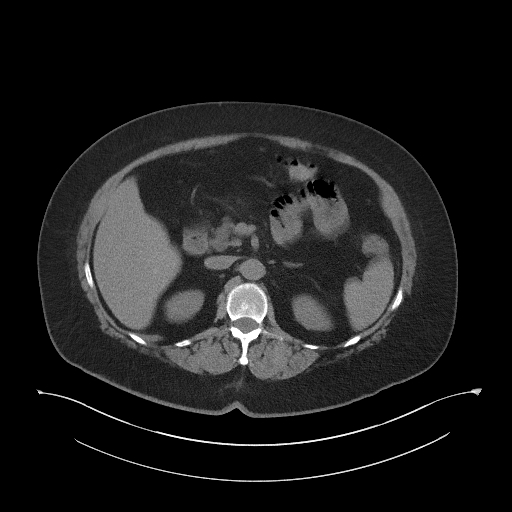
[im 74/94  soft-tissue]
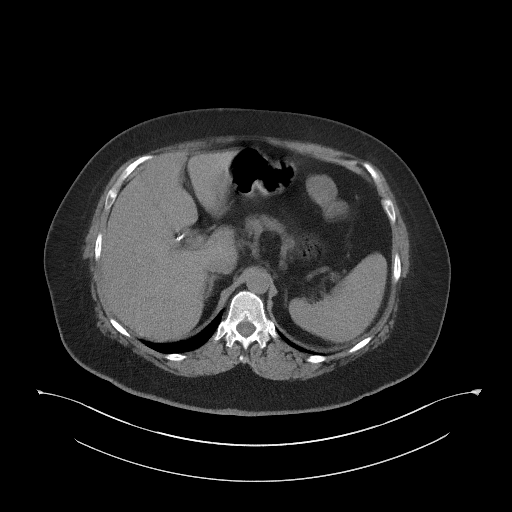
[im 82/94  soft-tissue]
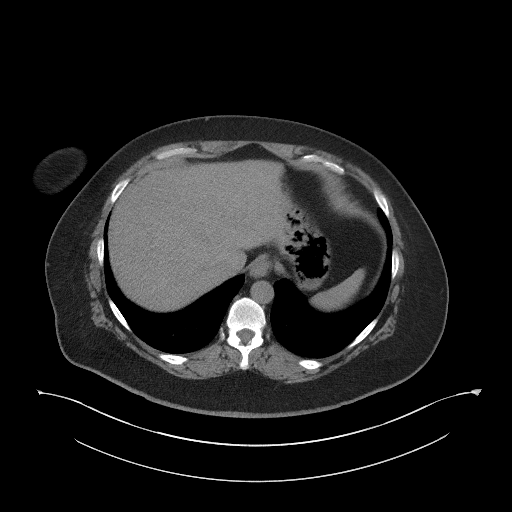
[im 90/94  soft-tissue]
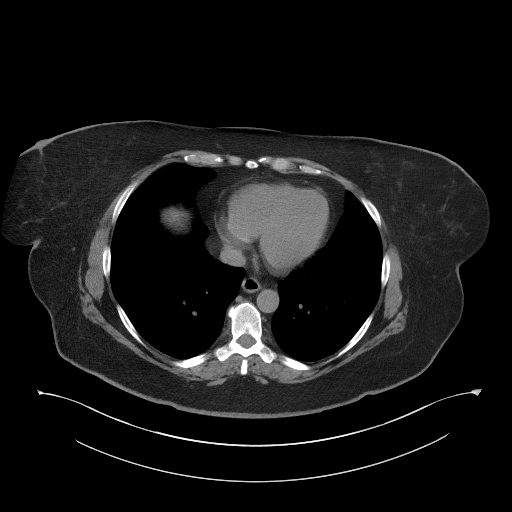

[Series 5: coronal st · coronal · 0.85mm/px · 3 of 95 slices shown]
[im 32/95  soft-tissue]
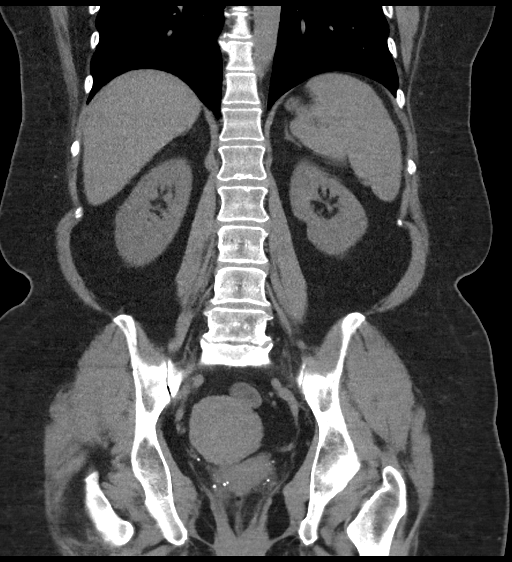
[im 42/95  soft-tissue]
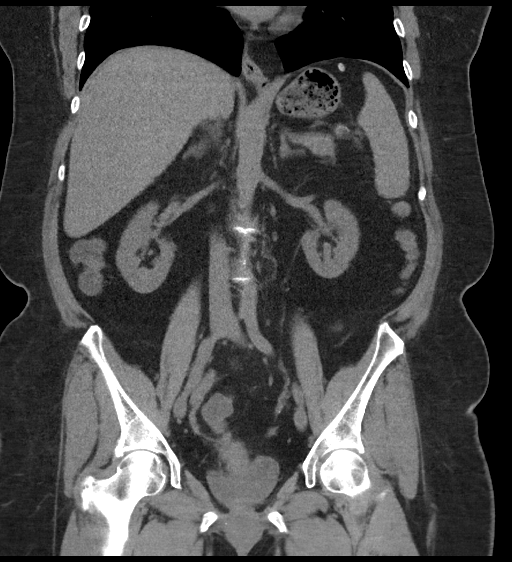
[im 53/95  soft-tissue]
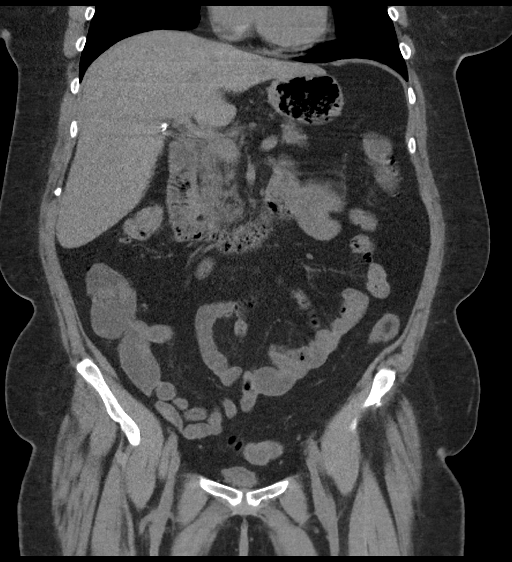

[16 of 46 positions shown; findings below may reference images not displayed]

FINDINGS: Lower chest: Negative.

Hepatobiliary: Surgically absent gallbladder. Negative noncontrast
liver.

Pancreas: Partially atrophied, otherwise negative.

Spleen: Negative.

Adrenals/Urinary Tract: Normal adrenal glands. Nonobstructed
kidneys. No pararenal inflammation. Punctate left lower pole
nephrolithiasis (series 5, image 37). But normal ureters, diminutive
bladder, and no other urinary calculus.

Stomach/Bowel: Fluid in nondilated large and small bowel to the
rectum. Sigmoid diverticulosis, but no active inflammation.
Diminutive, normal retrocecal appendix. No discrete small bowel
inflammation. Mild food debris in the stomach. Negative duodenum. No
free air, free fluid, mesenteric inflammatory stranding.

Vascular/Lymphatic: Normal caliber abdominal aorta. No calcified
atherosclerosis or lymphadenopathy identified.

Reproductive: Uterine fundal fibroid, less distinct today in the
absence of contrast. Stable uterine size, retroverted. Adnexa are
within normal limits.

Other: No pelvic free fluid.  Numerous chronic pelvic phleboliths.

Musculoskeletal: Chronic lower lumbar disc, endplate, and facet
degeneration. No acute osseous abnormality identified.
IMPRESSION: 1. Fluid throughout nondilated large and small bowel to the rectum,
suggesting Enteritis/Diarrhea. No bowel obstruction or discrete
bowel inflammation. Normal appendix. Sigmoid diverticulosis.
2. Punctate left lower pole nephrolithiasis. No obstructive
uropathy.
3. Fibroid uterus.

## 2021-09-05 MED ORDER — HALOPERIDOL LACTATE 5 MG/ML IJ SOLN
2.0000 mg | Freq: Once | INTRAMUSCULAR | Status: AC
  Administered 2021-09-05: 2 mg via INTRAVENOUS
  Filled 2021-09-05: qty 1

## 2021-09-05 MED ORDER — FENTANYL CITRATE PF 50 MCG/ML IJ SOSY
50.0000 ug | PREFILLED_SYRINGE | Freq: Once | INTRAMUSCULAR | Status: AC
Start: 2021-09-05 — End: 2021-09-05
  Administered 2021-09-05: 50 ug via INTRAVENOUS
  Filled 2021-09-05: qty 1

## 2021-09-05 MED ORDER — DICYCLOMINE HCL 20 MG PO TABS: 20.0000 mg | ORAL_TABLET | Freq: Two times a day (BID) | ORAL | 0 refills | Status: AC

## 2021-09-05 NOTE — ED Provider Notes (Signed)
[facility] EMERGENCY DEPARTMENT Provider Note   CSN: [id number] Arrival date & time: 09/05/21  0347     History  Chief Complaint  Patient presents with   Abdominal Pain    TIFFNEY HAUGHTON is a 56 y.o. female.  The history is provided by the patient.  Abdominal Pain Pain location:  Epigastric and LUQ Pain quality comment:  Pulling and twisting Pain radiates to:  Does not radiate Pain severity:  Severe Onset quality: some degree of this pain for years and GI can't find the cause but worse tonight. Timing:  Constant Progression:  Worsening Chronicity: acute on chronic. Context: not diet changes, not eating, not recent illness, not suspicious food intake and not trauma   Relieved by:  Nothing Worsened by:  Nothing Ineffective treatments:  None tried Associated symptoms: vomiting   Associated symptoms: no anorexia, no constipation, no dysuria, no fever, no hematemesis, no shortness of breath and no vaginal discharge   Associated symptoms comment:  Chronic ongoing loose to watery stools  Risk factors: no recent hospitalization   Patient with Bipolar disorder and chronic back and pelvic pain and ongoing abdominal pain that no one can find a cause of presents with worsening abdominal pain this am.  " I need you to find the cause now."  No f/c/r Had emesis with this episode.  No urinary symptoms.  States her stools are not different than normal.     Past Medical History:  Diagnosis Date   Anemia    Asthma    Bipolar 2 disorder, major depressive episode ([place]) 11/28/2020   Cardiac murmur 04/30/2020   Depression    Dizziness 07/05/2018   Formatting of this note might be different from the original. Onset December 2019.  MRI of the brain okay.  Last Assessment & Plan:  Formatting of this note might be different from the original. Complex disease history.  Onset back in December.  Since then she has experienced syncopal episodes on at least 2 occasions.  Frequent spinning  episodes and almost constant unsteadiness that is improved wi   DVT (deep venous thrombosis) (HCC)    lower leg while traveling went to lung   Fatty liver 03/14/2021   GAD (generalized anxiety disorder) 08/06/2017   Generalized anxiety disorder 08/06/2017   GERD (gastroesophageal reflux disease)    Headache    Migraines   History of deep venous thrombosis (DVT) of distal vein of right lower extremity 12/21/2016   Formatting of this note might be different from the original. Anticoagulation x 6 months; no recurrence.  Dx 2010 Formatting of this note might be different from the original. Overview:  Anticoagulation x 6 months; no recurrence.  Dx 2010   History of fusion of cervical spine 12/21/2016   Formatting of this note might be different from the original. 2011-2012 Formatting of this note might be different from the original. Overview:  2011-2012   History of kidney stones    Increased liver enzymes 03/14/2021   Left ureteral calculus 11/18/2017   Major depressive disorder, recurrent episode, moderate ([place]) 12/28/2019   Major depressive disorder, single episode, severe with psychotic features ([place]) 06/18/2020   Mild intermittent asthma without complication 10/31/2438   Mild renal insufficiency 03/14/2021   Mixed hyperlipidemia 06/29/2017   Formatting of this note might be different from the original. 10 year CVD risk 5%   Morbid obesity ([place]) 04/30/2020   Nausea and vomiting 06/25/2018   Obesity (BMI 30-39.9) 12/21/2016   Post-traumatic stress disorder, unspecified  12/21/2018   Posttraumatic stress disorder 06/18/2020   Prediabetes 03/14/2021   Preop cardiovascular exam 04/30/2020   Restless leg syndrome 12/21/2016   S/P cholecystectomy 03/14/2021   Severe recurrent major depression without psychotic features ([place]) 01/05/2013   Formatting of this note might be different from the original. 02/2021: Under care of behavioral health   Spondylolysis of cervical spine 10/01/2020   Thrombophlebitis arm  07/06/2018   Formatting of this note might be different from the original. 06/2018: Noted on [place] RUE   Uterine fibroid 03/14/2021   Formatting of this note might be different from the original. 02/2021: Stable 4 cm posterior fundal fibroid   Vaginal Pap smear, abnormal    Vitamin D insufficiency     Home Medications Prior to Admission medications   Medication Sig Start Date End Date Taking? Authorizing Provider  dicyclomine (BENTYL) 20 MG tablet Take 1 tablet (20 mg total) by mouth 2 (two) times daily. 09/05/21  Yes Makynzi Eastland, MD  Acetaminophen-Codeine (TYLENOL/CODEINE #3) 300-30 MG tablet Take 1 tablet by mouth every 6 (six) hours as needed for up to 10 doses for pain. 07/10/21   Wyvonnia Dusky, MD  albuterol (VENTOLIN HFA) 108 (90 Base) MCG/ACT inhaler Inhale 2 puffs into the lungs every 6 (six) hours as needed for wheezing or shortness of breath. 03/06/20   Argentina Donovan, PA-C  dicyclomine (BENTYL) 20 MG tablet Take 1 tablet (20 mg total) by mouth 2 (two) times daily. 03/05/21   Alroy Bailiff, Margaux, PA-C  famotidine (PEPCID) 20 MG tablet Take 1 tablet (20 mg total) by mouth 2 (two) times daily. Needs OV for additional refills. 01/28/21   Charlott Rakes, MD  gabapentin (NEURONTIN) 300 MG capsule Take 300 mg by mouth 3 (three) times daily.    [provider]  LORazepam (ATIVAN) 0.5 MG tablet Take 1 tablet (0.5 mg total) by mouth daily. 09/03/21   Salley Slaughter, NP  meclizine (ANTIVERT) 25 MG tablet Take 1 tablet (25 mg total) by mouth 3 (three) times daily as needed for dizziness. 01/28/21   Charlott Rakes, MD  metoCLOPramide (REGLAN) 10 MG tablet Take 1 tablet (10 mg total) by mouth every 6 (six) hours. 03/05/21   Eustaquio Maize, PA-C  Multiple Vitamin (MULTIVITAMIN WITH MINERALS) TABS tablet Take 1 tablet by mouth daily.    [provider]  omeprazole (PRILOSEC) 40 MG capsule Take 1 capsule (40 mg total) by mouth 2 (two) times daily. 01/28/21   Charlott Rakes, MD   pantoprazole (PROTONIX) 40 MG tablet Take 1 tablet (40 mg total) by mouth 2 (two) times daily before a meal. 01/10/21   Marrian Salvage, FNP  PARoxetine (PAXIL) 20 MG tablet Take 1 tablet (20 mg total) by mouth daily. 09/03/21   Salley Slaughter, NP  promethazine (PHENERGAN) 12.5 MG tablet Take 1 tablet (12.5 mg total) by mouth every 8 (eight) hours as needed for nausea or vomiting. 08/09/19   Fulp, Cammie, MD  QUEtiapine 150 MG TABS Take 150 mg by mouth at bedtime. 09/03/21   Salley Slaughter, NP  traZODone (DESYREL) 100 MG tablet Take 1 tablet (100 mg total) by mouth at bedtime. 09/03/21   Salley Slaughter, NP      Allergies    Naproxen, Avocado, Sulfa antibiotics, Sulfamethoxazole, Sulfasalazine, Fluconazole, Nickel, Ondansetron, and Penicillins    Review of Systems   Review of Systems  Constitutional:  Negative for fever.  HENT:  Negative for congestion.   Eyes:  Negative for redness.  Respiratory:  Negative for shortness of breath.   Gastrointestinal:  Positive for abdominal pain and vomiting. Negative for anorexia, constipation and hematemesis.  Genitourinary:  Negative for dysuria, flank pain and vaginal discharge.  Skin:  Negative for rash.  Neurological:  Negative for facial asymmetry.  All other systems reviewed and are negative.  Physical Exam Updated Vital Signs BP 105/73    Pulse 92    Temp (!) 97.2 F (36.2 C) (Oral)    Resp (!) 24    Ht 5\' 5"  (1.651 m)    Wt 101.2 kg    LMP 03/21/2017 (Exact Date)    SpO2 100%    BMI 37.11 kg/m  Physical Exam Vitals and nursing note reviewed. Exam conducted with a chaperone present.  Constitutional:      Appearance: Normal appearance. She is not diaphoretic.  HENT:     Head: Normocephalic and atraumatic.     Nose: Nose normal.  Eyes:     Conjunctiva/sclera: Conjunctivae normal.     Pupils: Pupils are equal, round, and reactive to light.  Cardiovascular:     Rate and Rhythm: Normal rate and regular rhythm.     Pulses:  Normal pulses.     Heart sounds: Normal heart sounds.  Pulmonary:     Effort: Pulmonary effort is normal.     Breath sounds: Normal breath sounds.  Abdominal:     General: Bowel sounds are normal.     Palpations: Abdomen is soft. There is no mass.     Tenderness: There is no abdominal tenderness. There is no guarding or rebound.     Hernia: No hernia is present.  Musculoskeletal:        General: Normal range of motion.     Cervical back: Normal range of motion and neck supple.  Skin:    General: Skin is warm and dry.     Capillary Refill: Capillary refill takes less than 2 seconds.  Neurological:     General: No focal deficit present.     Mental Status: She is alert and oriented to person, place, and time.     Deep Tendon Reflexes: Reflexes normal.  Psychiatric:        Mood and Affect: Mood is anxious. Affect is angry.        Behavior: Behavior is aggressive.    ED Results / Procedures / Treatments   Labs (all labs ordered are listed, but only abnormal results are displayed) Labs Reviewed  CBC WITH DIFFERENTIAL/PLATELET - Abnormal; Notable for the following components:      Result Value   Neutro Abs 8.4 (*)    All other components within normal limits  COMPREHENSIVE METABOLIC PANEL - Abnormal; Notable for the following components:   CO2 20 (*)    Glucose, Bld 114 (*)    All other components within normal limits    EKG None  Radiology CT Renal Stone Study  Result Date: 09/05/2021 CLINICAL DATA:  56 year old female with abdominal pain, flank pain and vomiting. Mild renal insufficiency. EXAM: CT ABDOMEN AND PELVIS WITHOUT CONTRAST TECHNIQUE: Multidetector CT imaging of the abdomen and pelvis was performed following the standard protocol without IV contrast. RADIATION DOSE REDUCTION: This exam was performed according to the departmental dose-optimization program which includes automated exposure control, adjustment of the mA and/or kV according to patient size and/or use of  iterative reconstruction technique. COMPARISON:  [facility] CT Abdomen and Pelvis 05/15/2021, and earlier. FINDINGS: Lower chest: Negative. Hepatobiliary: Surgically absent gallbladder. Negative  noncontrast liver. Pancreas: Partially atrophied, otherwise negative. Spleen: Negative. Adrenals/Urinary Tract: Normal adrenal glands. Nonobstructed kidneys. No pararenal inflammation. Punctate left lower pole nephrolithiasis (series 5, image 37). But normal ureters, diminutive bladder, and no other urinary calculus. Stomach/Bowel: Fluid in nondilated large and small bowel to the rectum. Sigmoid diverticulosis, but no active inflammation. Diminutive, normal retrocecal appendix. No discrete small bowel inflammation. Mild food debris in the stomach. Negative duodenum. No free air, free fluid, mesenteric inflammatory stranding. Vascular/Lymphatic: Normal caliber abdominal aorta. No calcified atherosclerosis or lymphadenopathy identified. Reproductive: Uterine fundal fibroid, less distinct today in the absence of contrast. Stable uterine size, retroverted. Adnexa are within normal limits. Other: No pelvic free fluid.  Numerous chronic pelvic phleboliths. Musculoskeletal: Chronic lower lumbar disc, endplate, and facet degeneration. No acute osseous abnormality identified. IMPRESSION: 1. Fluid throughout nondilated large and small bowel to the rectum, suggesting Enteritis/Diarrhea. No bowel obstruction or discrete bowel inflammation. Normal appendix. Sigmoid diverticulosis. 2. Punctate left lower pole nephrolithiasis. No obstructive uropathy. 3. Fibroid uterus. Electronically Signed   By: Genevie Ann M.D.   On: 09/05/2021 04:54    Procedures Procedures    Medications Ordered in ED Medications  haloperidol lactate (HALDOL) injection 2 mg (2 mg Intravenous Given 09/05/21 0508)  fentaNYL (SUBLIMAZE) injection 50 mcg (50 mcg Intravenous Given 09/05/21 0508)    ED Course/ Medical Decision Making/ A&P                            Medical Decision Making Patient with ongoing abdominal pain worse tonight with emesis.  States GI cannot find the source nor can her PMD  Amount and/or Complexity of Data Reviewed External Data Reviewed: labs and notes.    Details: reviewed outside labs and notes in care everywhere Labs: ordered.    Details: I have reviewed CBC and the white count is normal as is the hemoglobin, normal creatinine and electrolytes Radiology: ordered.    Details: CT personally reviewed,  No signs of colitis etc.  Risk Prescription drug management. Risk Details: Patient with acute on chronic abdominal pain.  Patient confided that her mental health meds were cut and based on her affect when I examined her and allergy to zofran I chose haldol as an antiemetic.  Moreover.  I told the patient about her CT findings of liquid stool consistent with diarrheal illness and patient now states she always has diarrhea and this is chronic.  This would explain CT findings but patient had denied this to both I and her nurse earlier.  He mood is markedly improved.  I have apologized for patient's frustration with her chronic pain and navigating the healthcare system.  I reccomended close follow up.  Fortunately, she has an appointment today with her PMD. She can discuss her ongoing concerns about chronic pain and her mental health medications.  Her exam, labs and imaging are reassuring and patient is stable for discharge.     Final Clinical Impression(s) / ED Diagnoses Final diagnoses:  Pain of upper abdomen  Return for intractable cough, coughing up blood, fevers > 100.4 unrelieved by medication, shortness of breath, intractable vomiting, chest pain, shortness of breath, weakness, numbness, changes in speech, facial asymmetry, abdominal pain, passing out, Inability to tolerate liquids or food, cough, altered mental status or any concerns. No signs of systemic illness or infection. The patient is nontoxic-appearing on  exam and vital signs are within normal limits.  I have reviewed the triage vital signs and  the nursing notes. Pertinent labs & imaging results that were available during my care of the patient were reviewed by me and considered in my medical decision making (see chart for details). After history, exam, and medical workup I feel the patient has been appropriately medically screened and is safe for discharge home. Pertinent diagnoses were discussed with the patient. Patient was given return precautions.  Rx / DC Orders ED Discharge Orders          Ordered    dicyclomine (BENTYL) 20 MG tablet  2 times daily        09/05/21 0608              Kyrollos Cordell, MD 09/05/21 3086

## 2021-09-05 NOTE — ED Triage Notes (Signed)
Pt c/o abd pain with vomiting.

## 2021-10-15 ENCOUNTER — Telehealth (HOSPITAL_COMMUNITY): Payer: Self-pay | Admitting: *Deleted

## 2021-10-15 NOTE — Telephone Encounter (Signed)
Received several requests for patients Paroxetine to be refilled. Her chart indicates she has medicine till May, written on 2/15 for one month and has 3 refills. I spoke with [facility] which his mail order and they explained they consolidate the med so they dispensed a 90 day supply to save patient money. Their policy is the patient can initiate a refill when she uses the first 30 pills of her rx. She has a future appt on 11/20/21 so she doesn't need a new rx at this time, and has meds till her next appt. Ce Ce NP is covering for Brittneys patients while she is out on maternity leave. Will forward the message to see if she would be willing to send in a new rx per [facility] policy. ?

## 2021-10-20 ENCOUNTER — Telehealth (HOSPITAL_COMMUNITY): Payer: Self-pay

## 2021-10-20 NOTE — Telephone Encounter (Signed)
Writer contacted patient regarding county of residence. Chart notes that patient is indigent in [facility], which is not in [place] area. Patient is also insured by [facility]. Writer left confidential voicemail requesting patient to return call regarding following up for outpatient services at [phone].  ?

## 2021-11-11 ENCOUNTER — Other Ambulatory Visit (HOSPITAL_COMMUNITY): Payer: Self-pay | Admitting: Psychiatry

## 2021-11-11 DIAGNOSIS — F411 Generalized anxiety disorder: Secondary | ICD-10-CM

## 2021-11-11 DIAGNOSIS — F3181 Bipolar II disorder: Secondary | ICD-10-CM

## 2021-11-12 NOTE — Telephone Encounter (Signed)
Patient requesting bridge of medications until able to connect with new provider.  ? ?Writer attempted to call to follow-up on transitioning care due to residing outside of [place] and having [employer]. Probation officer initiated transition with [place]. Both unable to reach patient, but left voicemail to schedule with provider in at [facility].  ? ? ?

## 2021-11-18 ENCOUNTER — Telehealth (HOSPITAL_COMMUNITY): Payer: Medicare HMO | Admitting: Psychiatry

## 2021-11-25 ENCOUNTER — Other Ambulatory Visit (HOSPITAL_COMMUNITY): Payer: Self-pay | Admitting: Psychiatry

## 2021-11-25 DIAGNOSIS — F411 Generalized anxiety disorder: Secondary | ICD-10-CM

## 2021-12-01 ENCOUNTER — Encounter (HOSPITAL_COMMUNITY): Payer: Self-pay | Admitting: Psychiatry

## 2021-12-01 ENCOUNTER — Telehealth (INDEPENDENT_AMBULATORY_CARE_PROVIDER_SITE_OTHER): Payer: Medicare HMO | Admitting: Psychiatry

## 2021-12-01 DIAGNOSIS — F4310 Post-traumatic stress disorder, unspecified: Secondary | ICD-10-CM | POA: Diagnosis not present

## 2021-12-01 DIAGNOSIS — F411 Generalized anxiety disorder: Secondary | ICD-10-CM

## 2021-12-01 DIAGNOSIS — F3181 Bipolar II disorder: Secondary | ICD-10-CM | POA: Diagnosis not present
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
## 2022-01-12 ENCOUNTER — Telehealth (HOSPITAL_COMMUNITY): Payer: Medicare HMO | Admitting: Psychiatry

## 2022-03-18 ENCOUNTER — Other Ambulatory Visit (HOSPITAL_COMMUNITY): Payer: Self-pay | Admitting: Psychiatry

## 2022-03-31 ENCOUNTER — Other Ambulatory Visit: Payer: Self-pay

## 2022-03-31 ENCOUNTER — Emergency Department (HOSPITAL_BASED_OUTPATIENT_CLINIC_OR_DEPARTMENT_OTHER)
Admission: EM | Admit: 2022-03-31 | Discharge: 2022-03-31 | Disposition: A | Discharge status: Home / Self Care | Payer: Medicare HMO | Attending: Emergency Medicine | Admitting: Emergency Medicine

## 2022-03-31 ENCOUNTER — Encounter (HOSPITAL_BASED_OUTPATIENT_CLINIC_OR_DEPARTMENT_OTHER): Payer: Self-pay

## 2022-03-31 DIAGNOSIS — X500XXA Overexertion from strenuous movement or load, initial encounter: Secondary | ICD-10-CM | POA: Diagnosis not present

## 2022-03-31 DIAGNOSIS — M545 Low back pain, unspecified: Secondary | ICD-10-CM | POA: Diagnosis present

## 2022-03-31 DIAGNOSIS — M5442 Lumbago with sciatica, left side: Secondary | ICD-10-CM | POA: Diagnosis not present

## 2022-03-31 MED ORDER — HYDROCODONE-ACETAMINOPHEN 5-325 MG PO TABS
1.0000 | ORAL_TABLET | Freq: Four times a day (QID) | ORAL | 0 refills | Status: DC | PRN
Start: 2022-03-31 — End: 2022-12-11

## 2022-03-31 MED ORDER — KETOROLAC TROMETHAMINE 30 MG/ML IJ SOLN
30.0000 mg | Freq: Once | INTRAMUSCULAR | Status: AC
  Administered 2022-03-31: 30 mg via INTRAMUSCULAR
  Filled 2022-03-31: qty 1

## 2022-03-31 MED ORDER — HYDROCODONE-ACETAMINOPHEN 5-325 MG PO TABS
1.0000 | ORAL_TABLET | Freq: Once | ORAL | Status: AC
  Administered 2022-03-31: 1 via ORAL
  Filled 2022-03-31: qty 1

## 2022-03-31 MED ORDER — DEXAMETHASONE SODIUM PHOSPHATE 10 MG/ML IJ SOLN
10.0000 mg | Freq: Once | INTRAMUSCULAR | Status: AC
Start: 2022-03-31 — End: 2022-03-31
  Administered 2022-03-31: 10 mg via INTRAMUSCULAR
  Filled 2022-03-31: qty 1

## 2022-03-31 NOTE — ED Provider Notes (Signed)
[facility] Provider Note   CSN: [id number] Arrival date & time: 03/31/22  1915     History  Chief Complaint  Patient presents with   Back Pain    Casey Jordan is a 56 y.o. female.  Patient presents to the ED with complaint of acute on chronic back pain. Pain is located in the lower back and radiates into her left hip and thigh. This episode began after helping lift a heavy box and has not responded to her home pain medication. Denies urinary symptoms. No incontinence. No fevers or chills.  The history is provided by the patient and medical records.  Back Pain Location:  Lumbar spine Radiates to:  R posterior upper leg Pain severity:  Severe Pain is:  Same all the time Onset quality:  Sudden Duration:  2 days Timing:  Constant Progression:  Unchanged Chronicity:  New Context: lifting heavy objects   Ineffective treatments:  Cold packs, heating pad and muscle relaxants Associated symptoms: no abdominal pain, no bladder incontinence, no bowel incontinence, no fever, no numbness and no weakness        Home Medications Prior to Admission medications   Medication Sig Start Date End Date Taking? Authorizing Provider  Acetaminophen-Codeine (TYLENOL/CODEINE #3) 300-30 MG tablet Take 1 tablet by mouth every 6 (six) hours as needed for up to 10 doses for pain. 07/10/21   Wyvonnia Dusky, MD  albuterol (VENTOLIN HFA) 108 (90 Base) MCG/ACT inhaler Inhale 2 puffs into the lungs every 6 (six) hours as needed for wheezing or shortness of breath. 03/06/20   Argentina Donovan, PA-C  dicyclomine (BENTYL) 20 MG tablet Take 1 tablet (20 mg total) by mouth 2 (two) times daily. 03/05/21   Alroy Bailiff, Margaux, PA-C  dicyclomine (BENTYL) 20 MG tablet Take 1 tablet (20 mg total) by mouth 2 (two) times daily. 09/05/21   Palumbo, April, MD  famotidine (PEPCID) 20 MG tablet Take 1 tablet (20 mg total) by mouth 2 (two) times daily. Needs OV for additional refills.  01/28/21   Charlott Rakes, MD  gabapentin (NEURONTIN) 300 MG capsule Take 300 mg by mouth 3 (three) times daily.    [provider]  LORazepam (ATIVAN) 0.5 MG tablet Take 1 tablet (0.5 mg total) by mouth daily. 09/03/21   Salley Slaughter, NP  meclizine (ANTIVERT) 25 MG tablet Take 1 tablet (25 mg total) by mouth 3 (three) times daily as needed for dizziness. 01/28/21   Charlott Rakes, MD  metoCLOPramide (REGLAN) 10 MG tablet Take 1 tablet (10 mg total) by mouth every 6 (six) hours. 03/05/21   Eustaquio Maize, PA-C  Multiple Vitamin (MULTIVITAMIN WITH MINERALS) TABS tablet Take 1 tablet by mouth daily.    [provider]  omeprazole (PRILOSEC) 40 MG capsule Take 1 capsule (40 mg total) by mouth 2 (two) times daily. 01/28/21   Charlott Rakes, MD  pantoprazole (PROTONIX) 40 MG tablet Take 1 tablet (40 mg total) by mouth 2 (two) times daily before a meal. 01/10/21   Marrian Salvage, FNP  PARoxetine (PAXIL) 20 MG tablet Take 1 tablet (20 mg total) by mouth daily. 11/13/21   Nwoko, Terese Door, PA  promethazine (PHENERGAN) 12.5 MG tablet Take 1 tablet (12.5 mg total) by mouth every 8 (eight) hours as needed for nausea or vomiting. 08/09/19   Fulp, Cammie, MD  QUEtiapine Fumarate 150 MG TABS Take 1 tablet by mouth at bedtime. 11/13/21   Nwoko, Terese Door, PA  traZODone (DESYREL) 100 MG  tablet Take 1 tablet (100 mg total) by mouth at bedtime. 11/13/21   Nwoko, Terese Door, PA      Allergies    Naproxen, Avocado, Sulfa antibiotics, Sulfamethoxazole, Sulfasalazine, Fluconazole, Nickel, Ondansetron, and Penicillins    Review of Systems   Review of Systems  Constitutional:  Negative for fever.  Gastrointestinal:  Negative for abdominal pain and bowel incontinence.  Genitourinary:  Negative for bladder incontinence.  Musculoskeletal:  Positive for back pain.  Neurological:  Negative for weakness and numbness.  All other systems reviewed and are negative.   Physical Exam Updated Vital  Signs BP 108/72 (BP Location: Right Arm)   Pulse 94   Temp 98.6 F (37 C) (Oral)   Resp 20   Ht '5\' 5"'$  (1.651 m)   Wt 95.3 kg   LMP 03/21/2017 (Exact Date)   SpO2 95%   BMI 34.95 kg/m  Physical Exam Constitutional:      Appearance: Normal appearance.  HENT:     Head: Normocephalic.     Nose: Nose normal.  Eyes:     Conjunctiva/sclera: Conjunctivae normal.  Cardiovascular:     Rate and Rhythm: Normal rate and regular rhythm.  Pulmonary:     Effort: Pulmonary effort is normal.     Breath sounds: Normal breath sounds.  Abdominal:     Palpations: Abdomen is soft.  Musculoskeletal:        General: Normal range of motion.     Cervical back: Normal range of motion.     Comments: Low back pain radiating to left buttock and thigh  Skin:    General: Skin is warm and dry.  Neurological:     Mental Status: She is alert and oriented to person, place, and time.  Psychiatric:        Mood and Affect: Mood normal.        Behavior: Behavior normal.     ED Results / Procedures / Treatments   Labs (all labs ordered are listed, but only abnormal results are displayed) Labs Reviewed - No data to display  EKG None  Radiology No results found.  Procedures Procedures    Medications Ordered in ED Medications  dexamethasone (DECADRON) injection 10 mg (10 mg Intramuscular Given 03/31/22 2058)  ketorolac (TORADOL) 30 MG/ML injection 30 mg (30 mg Intramuscular Given 03/31/22 2058)  HYDROcodone-acetaminophen (NORCO/VICODIN) 5-325 MG per tablet 1 tablet (1 tablet Oral Given 03/31/22 2216)    ED Course/ Medical Decision Making/ A&P                           Medical Decision Making Risk Prescription drug management.   Patient with back pain.  No neurological deficits and normal neuro exam.  Patient is ambulatory.  No loss of bowel or bladder control.  No concern for cauda equina.  No fever, night sweats, weight loss, h/o cancer, IVDA, no recent procedure to back. No urinary symptoms  suggestive of UTI.  Supportive care and return precaution discussed. Appears safe for discharge at this time. Follow up as indicated in discharge paperwork.          Final Clinical Impression(s) / ED Diagnoses Final diagnoses:  Acute bilateral low back pain with left-sided sciatica    Rx / DC Orders ED Discharge Orders          Ordered    HYDROcodone-acetaminophen (NORCO/VICODIN) 5-325 MG tablet  Every 6 hours PRN        03/31/22 2211  Etta Quill, NP 03/31/22 2328    Drenda Freeze, MD 04/03/22 [phone]
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
## 2022-03-31 NOTE — Discharge Instructions (Addendum)
Please follow-up with your pain specialist.

## 2022-04-08 ENCOUNTER — Other Ambulatory Visit (HOSPITAL_COMMUNITY): Payer: Self-pay | Admitting: Physician Assistant

## 2022-04-08 DIAGNOSIS — F411 Generalized anxiety disorder: Secondary | ICD-10-CM

## 2022-04-08 DIAGNOSIS — F3181 Bipolar II disorder: Secondary | ICD-10-CM

## 2022-05-22 ENCOUNTER — Emergency Department (HOSPITAL_BASED_OUTPATIENT_CLINIC_OR_DEPARTMENT_OTHER): Payer: Medicare HMO

## 2022-05-22 ENCOUNTER — Other Ambulatory Visit: Payer: Self-pay

## 2022-05-22 ENCOUNTER — Encounter (HOSPITAL_BASED_OUTPATIENT_CLINIC_OR_DEPARTMENT_OTHER): Payer: Self-pay | Admitting: Emergency Medicine

## 2022-05-22 ENCOUNTER — Emergency Department (HOSPITAL_BASED_OUTPATIENT_CLINIC_OR_DEPARTMENT_OTHER)
Admission: EM | Admit: 2022-05-22 | Discharge: 2022-05-23 | Disposition: A | Discharge status: Home / Self Care | Payer: Medicare HMO | Attending: Emergency Medicine | Admitting: Emergency Medicine

## 2022-05-22 DIAGNOSIS — R55 Syncope and collapse: Secondary | ICD-10-CM | POA: Diagnosis present

## 2022-05-22 DIAGNOSIS — Z79899 Other long term (current) drug therapy: Secondary | ICD-10-CM | POA: Diagnosis not present

## 2022-05-22 DIAGNOSIS — R4182 Altered mental status, unspecified: Secondary | ICD-10-CM | POA: Diagnosis not present

## 2022-05-22 DIAGNOSIS — R519 Headache, unspecified: Secondary | ICD-10-CM | POA: Diagnosis not present

## 2022-05-22 DIAGNOSIS — R42 Dizziness and giddiness: Secondary | ICD-10-CM | POA: Insufficient documentation

## 2022-05-22 DIAGNOSIS — Y900 Blood alcohol level of less than 20 mg/100 ml: Secondary | ICD-10-CM | POA: Diagnosis not present

## 2022-05-22 DIAGNOSIS — J320 Chronic maxillary sinusitis: Secondary | ICD-10-CM | POA: Diagnosis not present

## 2022-05-22 LAB — BASIC METABOLIC PANEL
Anion gap: 10 (ref 5–15)
BUN: 14 mg/dL (ref 6–20)
CO2: 23 mmol/L (ref 22–32)
Calcium: 9.5 mg/dL (ref 8.9–10.3)
Chloride: 105 mmol/L (ref 98–111)
Creatinine, Ser: 0.94 mg/dL (ref 0.44–1.00)
GFR, Estimated: >60 mL/min (ref >60)
Glucose, Bld: 88 mg/dL (ref 70–99)
Potassium: 4.1 mmol/L (ref 3.5–5.1)
Sodium: 138 mmol/L (ref 135–145)

## 2022-05-22 LAB — CBC
HCT: 40 % (ref 36.0–46.0)
Hemoglobin: 13.4 g/dL (ref 12.0–15.0)
MCH: 28.8 pg (ref 26.0–34.0)
MCHC: 33.5 g/dL (ref 30.0–36.0)
MCV: 85.8 fL (ref 80.0–100.0)
Platelets: 275 10*3/uL (ref 150–400)
RBC: 4.66 MIL/uL (ref 3.87–5.11)
RDW: 14 % (ref 11.5–15.5)
WBC: 8.2 10*3/uL (ref 4.0–10.5)
nRBC: 0 % (ref 0.0–0.2)

## 2022-05-22 LAB — TROPONIN I (HIGH SENSITIVITY): Troponin I (High Sensitivity): <2 ng/L (ref <18)

## 2022-05-22 LAB — ETHANOL: Alcohol, Ethyl (B): <10 mg/dL (ref <10)

## 2022-05-22 LAB — CBG MONITORING, ED: Glucose-Capillary: 86 mg/dL (ref 70–99)

## 2022-05-22 MED ORDER — SODIUM CHLORIDE 0.9 % IV BOLUS
1000.0000 mL | Freq: Once | INTRAVENOUS | Status: AC
  Administered 2022-05-22: 1000 mL via INTRAVENOUS

## 2022-05-22 NOTE — ED Triage Notes (Signed)
Pt reports syncopal episode with + loc last night which resulted in her falling in the floor. Reports another syncopal episode tonight while driving. Pt endorses dizziness and posterior neck pain at this time.

## 2022-05-22 NOTE — ED Notes (Signed)
Ttempted ultrasound IV X 1 without success. Not able to thread vein after cannulation. Patient tolerated well.
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
## 2022-05-22 NOTE — ED Notes (Signed)
Patient transported to CT 

## 2022-05-22 NOTE — ED Notes (Signed)
ED Provider at bedside. 

## 2022-05-22 NOTE — ED Notes (Signed)
While finishing the patients triage, pt stated she felt like she was going to pass out again. Pt fell back into the chair, eyes rolled back and pt was unresponsive for ~ 30 seconds. Pt then became responsive to sternal rub. Pt transfer to treatment room at this time.

## 2022-05-23 DIAGNOSIS — R55 Syncope and collapse: Secondary | ICD-10-CM | POA: Diagnosis not present

## 2022-05-23 LAB — URINALYSIS, MICROSCOPIC (REFLEX)

## 2022-05-23 LAB — URINALYSIS, ROUTINE W REFLEX MICROSCOPIC
Bilirubin Urine: NEGATIVE
Glucose, UA: NEGATIVE mg/dL
Hgb urine dipstick: NEGATIVE
Ketones, ur: NEGATIVE mg/dL
Nitrite: NEGATIVE
Protein, ur: NEGATIVE mg/dL
Specific Gravity, Urine: 1.02 (ref 1.005–1.030)
pH: 6 (ref 5.0–8.0)

## 2022-05-23 LAB — RAPID URINE DRUG SCREEN, HOSP PERFORMED
Amphetamines: NOT DETECTED
Barbiturates: NOT DETECTED
Benzodiazepines: NOT DETECTED
Cocaine: NOT DETECTED
Opiates: NOT DETECTED
Tetrahydrocannabinol: NOT DETECTED

## 2022-05-23 MED ORDER — KETOROLAC TROMETHAMINE 30 MG/ML IJ SOLN
30.0000 mg | Freq: Once | INTRAMUSCULAR | Status: AC
  Administered 2022-05-23: 30 mg via INTRAVENOUS
  Filled 2022-05-23: qty 1

## 2022-05-23 NOTE — ED Provider Notes (Signed)
Care of the patient assumed at the change of shift. Patient here for multiple syncopal episodes in the last few days. ED workup thus far has been reassuring. She is awaiting UA and reassessment at shift change.  12:47 AM She is awake and alert, only complaining now of a migraine headache. She has listed allergy to Naproxyn, but reports she tolerates other NSAIDs without difficulty, will give a dose of Toradol. UA shows some signs of infection vs contaminate, but she is not having any urinary symptoms, UDS is neg. She was able to stand and walk to the bathroom and orthostatics are normal. Plan discharge, recommend she avoid driving or operating machinery until she is able to follow up with her PCP. RTED for any other concerning symptoms.    Truddie Hidden, MD 05/23/22 [phone]

## 2022-05-23 NOTE — Discharge Instructions (Signed)
Please rest and stay hydrated over the weekend. Avoid driving or operating machinery until you have been re-evaluated by your primary care doctor. Return to the ER if your symptoms worsen.

## 2022-09-06 ENCOUNTER — Other Ambulatory Visit (HOSPITAL_COMMUNITY): Payer: Self-pay | Admitting: Physician Assistant

## 2022-09-06 DIAGNOSIS — F3181 Bipolar II disorder: Secondary | ICD-10-CM

## 2022-09-06 DIAGNOSIS — F411 Generalized anxiety disorder: Secondary | ICD-10-CM

## 2022-12-11 ENCOUNTER — Other Ambulatory Visit: Payer: Self-pay

## 2022-12-11 ENCOUNTER — Encounter (HOSPITAL_BASED_OUTPATIENT_CLINIC_OR_DEPARTMENT_OTHER): Payer: Self-pay

## 2022-12-11 ENCOUNTER — Emergency Department (HOSPITAL_BASED_OUTPATIENT_CLINIC_OR_DEPARTMENT_OTHER)
Admission: EM | Admit: 2022-12-11 | Discharge: 2022-12-11 | Disposition: A | Discharge status: Home / Self Care | Payer: Medicare HMO | Attending: Emergency Medicine | Admitting: Emergency Medicine

## 2022-12-11 ENCOUNTER — Emergency Department (HOSPITAL_BASED_OUTPATIENT_CLINIC_OR_DEPARTMENT_OTHER): Payer: Medicare HMO

## 2022-12-11 DIAGNOSIS — M7989 Other specified soft tissue disorders: Secondary | ICD-10-CM | POA: Diagnosis not present

## 2022-12-11 DIAGNOSIS — M79652 Pain in left thigh: Secondary | ICD-10-CM | POA: Diagnosis present

## 2022-12-11 MED ORDER — HYDROMORPHONE HCL 1 MG/ML IJ SOLN
1.0000 mg | Freq: Once | INTRAMUSCULAR | Status: AC
  Administered 2022-12-11: 1 mg via INTRAMUSCULAR
  Filled 2022-12-11: qty 1

## 2022-12-11 MED ORDER — HYDROCODONE-ACETAMINOPHEN 5-325 MG PO TABS: 1.0000 | ORAL_TABLET | Freq: Four times a day (QID) | ORAL | 0 refills | Status: DC | PRN

## 2022-12-11 MED ORDER — METOCLOPRAMIDE HCL 10 MG PO TABS: 10.0000 mg | ORAL_TABLET | Freq: Three times a day (TID) | ORAL | 0 refills | Status: DC | PRN

## 2022-12-11 MED ORDER — METOCLOPRAMIDE HCL 10 MG PO TABS
5.0000 mg | ORAL_TABLET | Freq: Once | ORAL | Status: AC
  Administered 2022-12-11: 5 mg via ORAL
  Filled 2022-12-11: qty 1

## 2022-12-11 NOTE — ED Notes (Addendum)
Spoke EDP regarding left leg splint. May use straight leg Velcro splint with crutches.

## 2022-12-11 NOTE — ED Provider Notes (Addendum)
[facility] Provider Note   CSN: [id number] Arrival date & time: 12/11/22  4696     History  Chief Complaint  Patient presents with   Leg Injury    AIMI CASASANTA is a 57 y.o. female.  HPI   57 year old female presents emergency department with popping sensation in her left posterior thigh.  Last night she was playing one of the virtual Wii games, bowling.  When she went to extend her left leg back and lunged to bowl she heard a popping sensation and had immediate pain in the left posterior thigh.  Since then she has had mild swelling, difficulty with weightbearing and ambulating.  No relief with over-the-counter medications.  Denies any anterior thigh/knee pain.  No numbness of the lower extremity. No back pain.  Home Medications Prior to Admission medications   Medication Sig Start Date End Date Taking? Authorizing Provider  Acetaminophen-Codeine (TYLENOL/CODEINE #3) 300-30 MG tablet Take 1 tablet by mouth every 6 (six) hours as needed for up to 10 doses for pain. 07/10/21   Terald Sleeper, MD  albuterol (VENTOLIN HFA) 108 (90 Base) MCG/ACT inhaler Inhale 2 puffs into the lungs every 6 (six) hours as needed for wheezing or shortness of breath. 03/06/20   Anders Simmonds, PA-C  cyanocobalamin (VITAMIN B12) 1000 MCG/ML injection Inject 1,000 mcg into the muscle every 28 (twenty-eight) days. 11/10/22   [provider]  dicyclomine (BENTYL) 20 MG tablet Take 1 tablet (20 mg total) by mouth 2 (two) times daily. 09/05/21   Palumbo, April, MD  famotidine (PEPCID) 20 MG tablet Take 1 tablet (20 mg total) by mouth 2 (two) times daily. Needs OV for additional refills. 01/28/21   Hoy Register, MD  gabapentin (NEURONTIN) 300 MG capsule Take 300 mg by mouth 3 (three) times daily.    [provider]  HYDROcodone-acetaminophen (NORCO/VICODIN) 5-325 MG tablet Take 1 tablet by mouth every 6 (six) hours as needed for severe pain.  03/31/22   Felicie Morn, NP  LORazepam (ATIVAN) 0.5 MG tablet Take 1 tablet (0.5 mg total) by mouth daily. 09/03/21   Shanna Cisco, NP  meclizine (ANTIVERT) 25 MG tablet Take 1 tablet (25 mg total) by mouth 3 (three) times daily as needed for dizziness. 01/28/21   Hoy Register, MD  metoCLOPramide (REGLAN) 10 MG tablet Take 1 tablet (10 mg total) by mouth every 6 (six) hours. 03/05/21   Tanda Rockers, PA-C  Multiple Vitamin (MULTIVITAMIN WITH MINERALS) TABS tablet Take 1 tablet by mouth daily.    [provider]  omeprazole (PRILOSEC) 40 MG capsule Take 1 capsule (40 mg total) by mouth 2 (two) times daily. 01/28/21   Hoy Register, MD  pantoprazole (PROTONIX) 40 MG tablet Take 1 tablet (40 mg total) by mouth 2 (two) times daily before a meal. 01/10/21   Olive Bass, FNP  PARoxetine (PAXIL) 20 MG tablet TAKE 1 TABLET EVERY DAY 04/13/22   Nwoko, Tommas Olp, PA  promethazine (PHENERGAN) 12.5 MG tablet Take 1 tablet (12.5 mg total) by mouth every 8 (eight) hours as needed for nausea or vomiting. 08/09/19   Fulp, Cammie, MD  QUEtiapine Fumarate 150 MG TABS TAKE 1 TABLET AT BEDTIME 04/13/22   Nwoko, Stephens Shire E, PA  rosuvastatin (CRESTOR) 20 MG tablet Take 20 mg by mouth daily.    [provider]  traZODone (DESYREL) 100 MG tablet TAKE 1 TABLET AT BEDTIME 04/13/22   Nwoko, Tommas Olp, PA  Allergies    Codeine, Naproxen, Avocado, Sulfa antibiotics, Sulfamethoxazole, Sulfasalazine, Fluconazole, Nickel, Ondansetron, Penicillin g, Penicillins, and Sulfamethoxazole-trimethoprim    Review of Systems   Review of Systems  Constitutional:  Negative for fever.  Genitourinary:  Negative for flank pain.  Musculoskeletal:  Negative for back pain.       Left posterior thigh pain and mild swelling   Neurological:  Negative for numbness.    Physical Exam Updated Vital Signs BP 110/67 (BP Location: Right Arm)   Pulse 93   Temp 97.8 F (36.6 C) (Oral)   Resp 20   Ht 5\' 5"   (1.651 m)   Wt 92.1 kg   LMP 03/21/2017 (Exact Date)   SpO2 100%   BMI 33.78 kg/m  Physical Exam Vitals and nursing note reviewed.  Constitutional:      Appearance: Normal appearance.  HENT:     Head: Normocephalic.     Mouth/Throat:     Mouth: Mucous membranes are moist.  Cardiovascular:     Rate and Rhythm: Normal rate.  Pulmonary:     Effort: Pulmonary effort is normal. No respiratory distress.  Musculoskeletal:     Comments: Mild edema palpated just above the popliteal fossa with reproducible tenderness extending up to the left posterior mid thigh.  Patellar tendon and quadriceps tendon are nontender, nonswollen and appear intact, extension of the leg is preserved.  Patient has pain and difficulty with flexing of the left thigh.  The left foot is neurovascularly intact.  No pain extending up into the buttocks or lower back/spine.  Skin:    General: Skin is warm.  Neurological:     Mental Status: She is alert and oriented to person, place, and time. Mental status is at baseline.  Psychiatric:        Mood and Affect: Mood normal.     ED Results / Procedures / Treatments   Labs (all labs ordered are listed, but only abnormal results are displayed) Labs Reviewed - No data to display  EKG None  Radiology No results found.  Procedures Procedures    Medications Ordered in ED Medications - No data to display  ED Course/ Medical Decision Making/ A&P                             Medical Decision Making Amount and/or Complexity of Data Reviewed Radiology: ordered.  Risk Prescription drug management.   57 year old female presenting with acute left posterior thigh pain.  This started last night when she was doing a lunge position playing a virtual bowling game.  Since then she has had mild swelling and pain along the left posterior thigh.  No discoloration of the foot.  Pain does not radiate into the buttocks or lower back.  No spinal TTP. No numbness.  Vital signs  are stable here.  The left foot is neurovascularly intact.  There is reproducible tenderness along the left posterior thigh, mainly the lower hamstring muscles/tendons.  X-ray shows no acute abnormality.  I believe the patient has suffered from hamstring strain/tear/tendon involvement.  She will be placed in a leg splint, given crutches and pain control and referred to orthopedics for further evaluation.  Patient at this time appears safe and stable for discharge and close outpatient follow up. Discharge plan and strict return to ED precautions discussed, patient verbalizes understanding and agreement.    Patient educated about the safety of taking Norco in the setting of her other prescriptions  and to not take her other medications at the same times in regards to oversedation.    Final Clinical Impression(s) / ED Diagnoses Final diagnoses:  None    Rx / DC Orders ED Discharge Orders     None         Rozelle Logan, DO 12/11/22 [id number]    Rozelle Logan, DO 12/11/22 1124

## 2022-12-11 NOTE — ED Triage Notes (Signed)
While in the house pt reports hearing a loud "pop" in left thigh. Unable to bare weight on left leg.

## 2022-12-11 NOTE — Discharge Instructions (Addendum)
You have been seen and discharged from the emergency department.  I believe you are suffering from a hamstring strain/pull/possible tendon rupture.  Your x-ray was normal.  You have been placed in a straight leg Velcro splint, use crutches for mobilization.  You have been prescribed stronger pain medicine and nausea medicine.  Do not mix this medication with alcohol or other sedating medications. Do not drive or do heavy physical activity until you know how this medication affects you.  It may cause drowsiness.  Call the orthopedic office to establish an appointment for follow-up.  If needed there should be an [facility] urgent care open on Saturday for evaluation (please see below).  If you have any worsening symptoms, severe pain, discoloration of the left calf/foot or further concerns for your health please return to an emergency department for further evaluation.   [facility] has an Orthopedic Urgent Care open on Saturdays. Does not appear that holiday weekend will affect these hours.  [address] [place], [place] [address] [phone]

## 2022-12-11 NOTE — ED Notes (Signed)
Pt sitting in lobby awaiting family. Pt aware not to drive. Security notified as well

## 2022-12-30 ENCOUNTER — Inpatient Hospital Stay (HOSPITAL_BASED_OUTPATIENT_CLINIC_OR_DEPARTMENT_OTHER)
Admission: EM | Admit: 2022-12-30 | Discharge: 2023-01-04 | DRG: 439 | Disposition: A | Discharge status: Home / Self Care | Payer: Medicare HMO | Attending: Internal Medicine | Admitting: Internal Medicine

## 2022-12-30 ENCOUNTER — Emergency Department (HOSPITAL_BASED_OUTPATIENT_CLINIC_OR_DEPARTMENT_OTHER): Payer: Medicare HMO

## 2022-12-30 ENCOUNTER — Encounter (HOSPITAL_BASED_OUTPATIENT_CLINIC_OR_DEPARTMENT_OTHER): Payer: Self-pay

## 2022-12-30 ENCOUNTER — Other Ambulatory Visit: Payer: Self-pay

## 2022-12-30 DIAGNOSIS — K8500 Idiopathic acute pancreatitis without necrosis or infection: Secondary | ICD-10-CM

## 2022-12-30 DIAGNOSIS — Z79899 Other long term (current) drug therapy: Secondary | ICD-10-CM

## 2022-12-30 DIAGNOSIS — J4520 Mild intermittent asthma, uncomplicated: Secondary | ICD-10-CM | POA: Diagnosis not present

## 2022-12-30 DIAGNOSIS — F3181 Bipolar II disorder: Secondary | ICD-10-CM | POA: Diagnosis present

## 2022-12-30 DIAGNOSIS — Z818 Family history of other mental and behavioral disorders: Secondary | ICD-10-CM

## 2022-12-30 DIAGNOSIS — Z880 Allergy status to penicillin: Secondary | ICD-10-CM

## 2022-12-30 DIAGNOSIS — F32A Depression, unspecified: Secondary | ICD-10-CM | POA: Diagnosis not present

## 2022-12-30 DIAGNOSIS — Z981 Arthrodesis status: Secondary | ICD-10-CM

## 2022-12-30 DIAGNOSIS — Z882 Allergy status to sulfonamides status: Secondary | ICD-10-CM

## 2022-12-30 DIAGNOSIS — Z86718 Personal history of other venous thrombosis and embolism: Secondary | ICD-10-CM

## 2022-12-30 DIAGNOSIS — Z91018 Allergy to other foods: Secondary | ICD-10-CM

## 2022-12-30 DIAGNOSIS — J45909 Unspecified asthma, uncomplicated: Secondary | ICD-10-CM | POA: Diagnosis present

## 2022-12-30 DIAGNOSIS — Z8249 Family history of ischemic heart disease and other diseases of the circulatory system: Secondary | ICD-10-CM

## 2022-12-30 DIAGNOSIS — K859 Acute pancreatitis without necrosis or infection, unspecified: Secondary | ICD-10-CM | POA: Diagnosis not present

## 2022-12-30 DIAGNOSIS — G2581 Restless legs syndrome: Secondary | ICD-10-CM | POA: Diagnosis present

## 2022-12-30 DIAGNOSIS — E782 Mixed hyperlipidemia: Secondary | ICD-10-CM | POA: Diagnosis present

## 2022-12-30 DIAGNOSIS — Z9049 Acquired absence of other specified parts of digestive tract: Secondary | ICD-10-CM

## 2022-12-30 DIAGNOSIS — Z7985 Long-term (current) use of injectable non-insulin antidiabetic drugs: Secondary | ICD-10-CM

## 2022-12-30 DIAGNOSIS — K760 Fatty (change of) liver, not elsewhere classified: Secondary | ICD-10-CM | POA: Diagnosis present

## 2022-12-30 DIAGNOSIS — Z8672 Personal history of thrombophlebitis: Secondary | ICD-10-CM

## 2022-12-30 DIAGNOSIS — Z6835 Body mass index (BMI) 35.0-35.9, adult: Secondary | ICD-10-CM

## 2022-12-30 DIAGNOSIS — Z86711 Personal history of pulmonary embolism: Secondary | ICD-10-CM

## 2022-12-30 DIAGNOSIS — F411 Generalized anxiety disorder: Secondary | ICD-10-CM | POA: Diagnosis present

## 2022-12-30 DIAGNOSIS — Z825 Family history of asthma and other chronic lower respiratory diseases: Secondary | ICD-10-CM

## 2022-12-30 DIAGNOSIS — Z885 Allergy status to narcotic agent status: Secondary | ICD-10-CM

## 2022-12-30 DIAGNOSIS — Z87442 Personal history of urinary calculi: Secondary | ICD-10-CM

## 2022-12-30 DIAGNOSIS — K219 Gastro-esophageal reflux disease without esophagitis: Secondary | ICD-10-CM | POA: Diagnosis present

## 2022-12-30 LAB — COMPREHENSIVE METABOLIC PANEL
ALT: 20 U/L (ref 0–44)
AST: 19 U/L (ref 15–41)
Albumin: 4 g/dL (ref 3.5–5.0)
Alkaline Phosphatase: 104 U/L (ref 38–126)
Anion gap: 9 (ref 5–15)
BUN: 12 mg/dL (ref 6–20)
CO2: 24 mmol/L (ref 22–32)
Calcium: 9 mg/dL (ref 8.9–10.3)
Chloride: 103 mmol/L (ref 98–111)
Creatinine, Ser: 0.8 mg/dL (ref 0.44–1.00)
GFR, Estimated: >60 mL/min (ref >60)
Glucose, Bld: 107 mg/dL — ABNORMAL HIGH (ref 70–99)
Potassium: 3.8 mmol/L (ref 3.5–5.1)
Sodium: 136 mmol/L (ref 135–145)
Total Bilirubin: 0.6 mg/dL (ref 0.3–1.2)
Total Protein: 7.6 g/dL (ref 6.5–8.1)

## 2022-12-30 LAB — CBC
HCT: 39.9 % (ref 36.0–46.0)
Hemoglobin: 13.2 g/dL (ref 12.0–15.0)
MCH: 28.9 pg (ref 26.0–34.0)
MCHC: 33.1 g/dL (ref 30.0–36.0)
MCV: 87.5 fL (ref 80.0–100.0)
Platelets: 374 10*3/uL (ref 150–400)
RBC: 4.56 MIL/uL (ref 3.87–5.11)
RDW: 13.7 % (ref 11.5–15.5)
WBC: 12.5 10*3/uL — ABNORMAL HIGH (ref 4.0–10.5)
nRBC: 0 % (ref 0.0–0.2)

## 2022-12-30 LAB — URINALYSIS, ROUTINE W REFLEX MICROSCOPIC
Bilirubin Urine: NEGATIVE
Glucose, UA: 100 mg/dL — AB
Hgb urine dipstick: NEGATIVE
Ketones, ur: NEGATIVE mg/dL
Leukocytes,Ua: NEGATIVE
Nitrite: NEGATIVE
Protein, ur: NEGATIVE mg/dL
Specific Gravity, Urine: >=1.03 (ref 1.005–1.030)
pH: 5.5 (ref 5.0–8.0)

## 2022-12-30 LAB — LIPASE, BLOOD: Lipase: 49 U/L (ref 11–51)

## 2022-12-30 MED ORDER — SODIUM CHLORIDE 0.9 % IV SOLN
12.5000 mg | Freq: Four times a day (QID) | INTRAVENOUS | Status: DC | PRN
  Administered 2022-12-30: 12.5 mg via INTRAVENOUS
  Filled 2022-12-30: qty 0.5

## 2022-12-30 MED ORDER — HYDROMORPHONE HCL 1 MG/ML IJ SOLN
0.5000 mg | Freq: Once | INTRAMUSCULAR | Status: AC
  Administered 2022-12-30: 0.5 mg via INTRAVENOUS
  Filled 2022-12-30: qty 1

## 2022-12-30 MED ORDER — PROMETHAZINE HCL 25 MG/ML IJ SOLN
INTRAMUSCULAR | Status: AC
  Filled 2022-12-30: qty 1

## 2022-12-30 MED ORDER — HYDROMORPHONE HCL 1 MG/ML IJ SOLN
1.0000 mg | Freq: Once | INTRAMUSCULAR | Status: AC
  Administered 2022-12-30: 1 mg via INTRAVENOUS
  Filled 2022-12-30: qty 1

## 2022-12-30 MED ORDER — SODIUM CHLORIDE 0.9 % IV BOLUS
1000.0000 mL | Freq: Once | INTRAVENOUS | Status: AC
  Administered 2022-12-30: 1000 mL via INTRAVENOUS

## 2022-12-30 MED ORDER — IOHEXOL 350 MG/ML SOLN
100.0000 mL | Freq: Once | INTRAVENOUS | Status: AC | PRN
  Administered 2022-12-30: 100 mL via INTRAVENOUS

## 2022-12-30 NOTE — ED Notes (Signed)
Pt back from imaging, lost PIV access.  EDP Britni made aware.

## 2022-12-30 NOTE — ED Triage Notes (Signed)
Pt reports right sided abd pain x 2 days associated with nausea. She went to her PCP today and was told to come to ER to rule out appendicitis. Marland Kitchen

## 2022-12-30 NOTE — ED Notes (Signed)
[facility] called for transport @22 :42

## 2022-12-30 NOTE — ED Provider Notes (Addendum)
[place] [facility] Provider Note   CSN: [id number] Arrival date & time: 12/30/22  1517     History  Chief Complaint  Patient presents with   Abdominal Pain    Casey Jordan is a 57 y.o. female history of anemia, DVT, GERD, kidney stones here for evaluation of abdominal pain.  Began 2 days ago.  Located to right abdomen and epigastric area.  Associated nausea without vomiting.  Went to her PCP who was told to come here for CT scan to rule out appendicitis.  Patient states she has had right upper abdominal pain over the last 2 days.  Pleuritic in nature.  Does not radiate to back.  Not worse with food intake however with overall decreased appetite.  Some nausea without vomiting.  Also with new cough without hemoptysis. Denies over CP. Hx of asthma using inhaler.  Does have history of PE, not currently anticoagulated.  No pain or swelling to lower legs, recent surgery, immobilization or malignancy.  No fever, shortness of breath, flank pain, dysuria, hematuria.  She is s/p cholecystectomy.  No chronic NSAID use, EtOH use  Patient tachycardic here  HPI     Home Medications Prior to Admission medications   Medication Sig Start Date End Date Taking? Authorizing Provider  albuterol (VENTOLIN HFA) 108 (90 Base) MCG/ACT inhaler Inhale 2 puffs into the lungs every 6 (six) hours as needed for wheezing or shortness of breath. 03/06/20   Anders Simmonds, PA-C  ALPRAZolam Prudy Feeler) 0.5 MG tablet Take 0.5 mg by mouth as needed for anxiety (flying). 08/31/22   [provider]  Baclofen 5 MG TABS Take 5 mg by mouth 3 (three) times daily as needed (muscle spasms). 01/02/22   [provider]  busPIRone (BUSPAR) 15 MG tablet Take 15 mg by mouth 3 (three) times daily. 09/08/22   [provider]  cyanocobalamin (VITAMIN B12) 1000 MCG/ML injection Inject 1,000 mcg into the muscle every 28 (twenty-eight) days. 11/10/22   [provider]   dicyclomine (BENTYL) 20 MG tablet Take 1 tablet (20 mg total) by mouth 2 (two) times daily. 09/05/21   Palumbo, April, MD  famotidine (PEPCID) 20 MG tablet Take 1 tablet (20 mg total) by mouth 2 (two) times daily. Needs OV for additional refills. 01/28/21   Hoy Register, MD  fluticasone (FLOVENT HFA) 110 MCG/ACT inhaler Inhale 1 puff into the lungs as needed (shortness of breath). 07/28/21   [provider]  gabapentin (NEURONTIN) 300 MG capsule Take 300 mg by mouth 3 (three) times daily.    [provider]  HYDROcodone-acetaminophen (NORCO) 5-325 MG tablet Take 1 tablet by mouth every 6 (six) hours as needed for moderate pain. 12/11/22   Horton, Clabe Seal, DO  lamoTRIgine (LAMICTAL) 25 MG tablet Take 25 mg by mouth at bedtime. 01/24/22   [provider]  lidocaine (LIDODERM) 5 % Place 1 patch onto the skin as needed (back pain). 06/28/22   [provider]  LORazepam (ATIVAN) 0.5 MG tablet Take 1 tablet (0.5 mg total) by mouth daily. Patient not taking: Reported on 12/11/2022 09/03/21   Shanna Cisco, NP  meclizine (ANTIVERT) 25 MG tablet Take 1 tablet (25 mg total) by mouth 3 (three) times daily as needed for dizziness. 01/28/21   Hoy Register, MD  metoCLOPramide (REGLAN) 10 MG tablet Take 1 tablet (10 mg total) by mouth every 8 (eight) hours as needed for nausea. 12/11/22   Horton, Clabe Seal, DO  MOUNJARO 10 MG/0.5ML Pen Inject 10 mg into the skin once a week. Sundays 09/17/22   [provider]  Multiple Vitamin (MULTIVITAMIN WITH MINERALS) TABS tablet Take 1 tablet by mouth daily.    [provider]  omeprazole (PRILOSEC) 40 MG capsule Take 1 capsule (40 mg total) by mouth 2 (two) times daily. 01/28/21   Hoy Register, MD  pantoprazole (PROTONIX) 40 MG tablet Take 1 tablet (40 mg total) by mouth 2 (two) times daily before a meal. Patient not taking: Reported on 12/11/2022 01/10/21   Olive Bass, FNP  PARoxetine (PAXIL) 20 MG tablet  TAKE 1 TABLET EVERY DAY Patient not taking: Reported on 12/11/2022 04/13/22   Meta Hatchet, PA  promethazine (PHENERGAN) 25 MG tablet Take 25 mg by mouth as needed for nausea or vomiting. 04/08/21   [provider]  QUEtiapine Fumarate 150 MG TABS TAKE 1 TABLET AT BEDTIME Patient taking differently: Take 150 mg by mouth at bedtime as needed (stress). 04/13/22   Nwoko, Tommas Olp, PA  rosuvastatin (CRESTOR) 20 MG tablet Take 20 mg by mouth daily.    [provider]  sertraline (ZOLOFT) 100 MG tablet Take 100 mg by mouth daily. 09/09/22   [provider]  traZODone (DESYREL) 100 MG tablet TAKE 1 TABLET AT BEDTIME Patient taking differently: Take 100 mg by mouth at bedtime as needed for sleep. 04/13/22   Nwoko, Tommas Olp, PA      Allergies    Codeine, Naproxen, Avocado, Sulfa antibiotics, Meloxicam, Sulfamethoxazole, Sulfasalazine, Fluconazole, Nickel, Ondansetron, Penicillin g, Penicillins, and Sulfamethoxazole-trimethoprim    Review of Systems   Review of Systems  Constitutional: Negative.   HENT: Negative.    Respiratory:  Positive for cough. Negative for apnea, choking, chest tightness, shortness of breath, wheezing and stridor.   Cardiovascular: Negative.   Gastrointestinal:  Positive for abdominal pain and nausea. Negative for abdominal distention, anal bleeding, blood in stool, constipation, diarrhea, rectal pain and vomiting.  Genitourinary: Negative.   Musculoskeletal: Negative.   Skin: Negative.   Neurological: Negative.   All other systems reviewed and are negative.   Physical Exam Updated Vital Signs BP 106/69   Pulse 80   Temp 98.3 F (36.8 C) (Oral)   Resp 16   Ht 5\' 5"  (1.651 m)   Wt 95.7 kg   LMP 03/21/2017 (Exact Date)   SpO2 100%   BMI 35.11 kg/m  Physical Exam Vitals and nursing note reviewed.  Constitutional:      General: She is not in acute distress.    Appearance: She is well-developed. She is not ill-appearing, toxic-appearing  or diaphoretic.  HENT:     Head: Normocephalic and atraumatic.  Eyes:     Pupils: Pupils are equal, round, and reactive to light.  Cardiovascular:     Rate and Rhythm: Normal rate.     Heart sounds: Normal heart sounds.  Pulmonary:     Effort: Pulmonary effort is normal. No respiratory distress.     Breath sounds: Normal breath sounds.  Abdominal:     General: Bowel sounds are normal. There is no distension.     Palpations: Abdomen is soft.     Tenderness: There is abdominal tenderness in the right upper quadrant and right lower quadrant. There is no right CVA tenderness, left CVA tenderness, guarding or rebound.     Hernia: No hernia is present.    Musculoskeletal:        General: Normal range of motion.  Cervical back: Normal range of motion.     Comments: No bony tenderness, compartments soft  Skin:    General: Skin is warm and dry.     Capillary Refill: Capillary refill takes less than 2 seconds.     Comments: No overlying edema, erythema, warmth, fluctuance or induration  Neurological:     General: No focal deficit present.     Mental Status: She is alert.  Psychiatric:        Mood and Affect: Mood normal.     ED Results / Procedures / Treatments   Labs (all labs ordered are listed, but only abnormal results are displayed) Labs Reviewed  COMPREHENSIVE METABOLIC PANEL - Abnormal; Notable for the following components:      Result Value   Glucose, Bld 107 (*)    All other components within normal limits  CBC - Abnormal; Notable for the following components:   WBC 12.5 (*)    All other components within normal limits  URINALYSIS, ROUTINE W REFLEX MICROSCOPIC - Abnormal; Notable for the following components:   Glucose, UA 100 (*)    All other components within normal limits  LIPASE, BLOOD    EKG EKG Interpretation  Date/Time:  Wednesday December 30 2022 16:56:24 EDT Ventricular Rate:  91 PR Interval:  158 QRS Duration: 109 QT Interval:  354 QTC  Calculation: 436 R Axis:   11 Text Interpretation: Sinus rhythm Low voltage, precordial leads No significant change since last tracing Confirmed by Elayne Snare ([id number]) on 12/30/2022 8:26:23 PM  Radiology CT ABDOMEN PELVIS W CONTRAST  Result Date: 12/30/2022 CLINICAL DATA:  Abdominal pain, acute, nonlocalized right diffuse abd pain, lower cp, cough, tachycardic EXAM: CT ABDOMEN AND PELVIS WITH CONTRAST TECHNIQUE: Multidetector CT imaging of the abdomen and pelvis was performed using the standard protocol following bolus administration of intravenous contrast. Performed in conjunction with CTA of the chest. RADIATION DOSE REDUCTION: This exam was performed according to the departmental dose-optimization program which includes automated exposure control, adjustment of the mA and/or kV according to patient size and/or use of iterative reconstruction technique. CONTRAST:  OMNIPAQUE IOHEXOL 350 MG/ML SOLN COMPARISON:  CT 09/05/2021 FINDINGS: Lower chest: Assessed on concurrent chest CT, reported separately. Hepatobiliary: The liver is enlarged spanning 20 cm cranial caudal. No focal liver abnormality. Prior cholecystectomy. Common bile duct at 10 mm, normal for prior cholecystectomy. Pancreas: There may be mild faint edema adjacent to the pancreatic head and uncinate process, series 9, image 33. No ductal dilatation. No pancreatic mass. Spleen: Normal in size without focal abnormality. Adrenals/Urinary Tract: Normal adrenal glands. No hydronephrosis or perinephric edema. Early excretion of IV contrast due to IV malfunction. Homogeneous renal enhancement with symmetric excretion on delayed phase imaging. No renal stone or focal renal abnormality. Urinary bladder is partially distended with excreted IV contrast, unremarkable for degree of distension. Stomach/Bowel: Minimal hiatal hernia. The stomach is otherwise unremarkable. No bowel obstruction or inflammation. Multifocal colonic diverticulosis without  diverticulitis. Normal appendix. Vascular/Lymphatic: Normal caliber abdominal aorta. Scattered small central mesenteric lymph nodes, not enlarged by size criteria. Reproductive: Retroverted uterus with posterior fundal fibroid. No adnexal mass. Other: No free air, free fluid, or intra-abdominal fluid collection. Musculoskeletal: Lumbar degenerative disc disease and facet hypertrophy. There are no acute or suspicious osseous abnormalities. IMPRESSION: 1. Minimal faint edema adjacent to the pancreatic head may represent mild acute pancreatitis. 2. Colonic diverticulosis without diverticulitis. 3. Uterine fibroid. Electronically Signed   By: Narda Rutherford M.D.   On: 12/30/2022 20:35  CT Angio Chest PE W and/or Wo Contrast  Result Date: 12/30/2022 CLINICAL DATA:  Pulmonary embolism (PE) suspected, high prob right lower cp, abd pain, worse with inspiration, hx of PE not anticoagulated, tachycardic EXAM: CT ANGIOGRAPHY CHEST WITH CONTRAST TECHNIQUE: Multidetector CT imaging of the chest was performed using the standard protocol during bolus administration of intravenous contrast. Multiplanar CT image reconstructions and MIPs were obtained to evaluate the vascular anatomy. Performed in conjunction with CT of the abdomen and pelvis. RADIATION DOSE REDUCTION: This exam was performed according to the departmental dose-optimization program which includes automated exposure control, adjustment of the mA and/or kV according to patient size and/or use of iterative reconstruction technique. CONTRAST:  OMNIPAQUE IOHEXOL 350 MG/ML SOLN COMPARISON:  Chest CTA 06/24/2022, no interval imaging available. FINDINGS: Cardiovascular: There are no filling defects within the pulmonary arteries to suggest pulmonary embolus. No aortic dissection or aneurysm. The heart is normal in size. No pericardial effusion. Mediastinum/Nodes: No mediastinal or hilar adenopathy. Patulous esophagus but no wall thickening. Small hiatal hernia.  Lungs/Pleura: Clear lungs. No focal airspace disease, pleural effusion or features of pulmonary edema. The trachea and central airways are clear. No pneumothorax. Upper Abdomen: Assessed on concurrent abdominal CT, reported separately. Musculoskeletal: There are no acute or suspicious osseous abnormalities. Lower cervical spine hardware is partially included. Minor thoracic spondylosis with spurring. No chest wall soft tissue abnormalities. Review of the MIP images confirms the above findings. IMPRESSION: No pulmonary embolus or acute intrathoracic abnormality. Electronically Signed   By: Narda Rutherford M.D.   On: 12/30/2022 20:07    Procedures Ultrasound ED Peripheral IV (Provider)  Date/Time: 12/30/2022 6:29 PM  Performed by: Linwood Dibbles, PA-C Authorized by: Linwood Dibbles, PA-C   Procedure details:    Indications: multiple failed IV attempts and poor IV access     Skin Prep: povidone-iodine     Location:  Left AC   Angiocath:  20 G   Bedside Ultrasound Guided: Yes     Images: archived     Patient tolerated procedure without complications: Yes     Dressing applied: Yes   Comments:     IV infiltrated on reassessment     Medications Ordered in ED Medications  promethazine (PHENERGAN) 12.5 mg in sodium chloride 0.9 % 50 mL IVPB (0 mg Intravenous Stopped 12/30/22 2109)  promethazine (PHENERGAN) 25 MG/ML injection (  Canceled Entry 12/30/22 1920)  sodium chloride 0.9 % bolus 1,000 mL (0 mLs Intravenous Stopped 12/30/22 2008)  HYDROmorphone (DILAUDID) injection 0.5 mg (0.5 mg Intravenous Given 12/30/22 1701)  iohexol (OMNIPAQUE) 350 MG/ML injection 100 mL (100 mLs Intravenous Contrast Given 12/30/22 1705)  HYDROmorphone (DILAUDID) injection 1 mg (1 mg Intravenous Given 12/30/22 1844)  HYDROmorphone (DILAUDID) injection 0.5 mg (0.5 mg Intravenous Given 12/30/22 2148)    ED Course/ Medical Decision Making/ A&P    57 year old here for evaluation of diffuse right-sided and  epigastric abdominal pain associated nausea, pleuritic in nature.  Not worse with food intake.  She is s/p cholecystectomy.  Seen by PCP today told to come here for CT scan to rule out appendicitis.  No change in bowels, bloody stool.  No chest pain, shortness of breath however she is tachycardic and does have history of PE, not currently anticoagulated.  Negative CVA tap, no urinary symptoms.  Does have history of kidney stones her denies hematuria, dysuria, feels different than when she has had prior kidney stones.  Will plan on labs, imaging and reassess  Labs and  imaging personally viewed and interpreted:  UA negative for infection, no blood Lipase 49 Metabolic panel glucose 107 CBC leukocytosis 12.5 EKG no ischemic changes CTA chest without acute abnormality CT abdomen pelvis with possible mild pancreatitis  The IV infiltrated to right AC.  Warm compress placed.  I attempted ultrasound IV to left forearm.  Placed however fluid when flushing.  Nursing to attempt additional ultrasound IV.  CT performed.  IV flushing.  Patient reassessed.  Still has moderate pain.  Will order additional Dilaudid  Patient reassessed.  Pain has improved however still has continued pain and nausea.  No vomiting.  We discussed her labs and imaging.  She denies any chronic NSAID use, bloody stool, significant EtOH use, drinks occasionally only once or twice a week however not with regularity.  Will admit for further pain control.  Consult with Dr. Margo Aye with hospitalist who is agreeable to accept patient in transfer for admission.  The patient appears reasonably stabilized for admission considering the current resources, flow, and capabilities available in the ED at this time, and I doubt any other [facility] requiring further screening and/or treatment in the ED prior to admission.                             Medical Decision Making Amount and/or Complexity of Data Reviewed External Data Reviewed: labs, radiology,  ECG and notes. Labs: ordered. Decision-making details documented in ED Course. Radiology: ordered and independent interpretation performed. Decision-making details documented in ED Course. ECG/medicine tests: ordered and independent interpretation performed. Decision-making details documented in ED Course.  Risk OTC drugs. Prescription drug management. Parenteral controlled substances. Decision regarding hospitalization. Diagnosis or treatment significantly limited by social determinants of health.    Final Clinical Impression(s) / ED Diagnoses Final diagnoses:  Acute pancreatitis, unspecified complication status, unspecified pancreatitis type    Rx / DC Orders ED Discharge Orders     None           Alquan Morrish A, PA-C 12/30/22 2202    Rexford Maus, DO 12/30/22 2322

## 2022-12-30 NOTE — ED Notes (Signed)
Pt transported to imaging.

## 2022-12-30 NOTE — ED Notes (Signed)
Patient to CT for CTA Chest and CT Abd/Pelvis. Patient had 20 G in Right AC. IV flushed  with saline by hand and injector without issue. Contrast was started and patient complained of pain after 24cc infused. Injection was stopped and IV site inspected. Evidence of small extravasation. IV was removed, area of extravasation marked on skin and heat pack placed. This technologist checked for new IV access; however none was found and patient returned to ED for possible [place] placed IV.

## 2022-12-30 NOTE — ED Notes (Signed)
Attempted report x1, no answer on receiving unit.  

## 2022-12-31 ENCOUNTER — Encounter (HOSPITAL_COMMUNITY): Payer: Self-pay | Admitting: Family Medicine

## 2022-12-31 DIAGNOSIS — Z9049 Acquired absence of other specified parts of digestive tract: Secondary | ICD-10-CM | POA: Diagnosis not present

## 2022-12-31 DIAGNOSIS — Z880 Allergy status to penicillin: Secondary | ICD-10-CM | POA: Diagnosis not present

## 2022-12-31 DIAGNOSIS — Z87442 Personal history of urinary calculi: Secondary | ICD-10-CM | POA: Diagnosis not present

## 2022-12-31 DIAGNOSIS — Z79899 Other long term (current) drug therapy: Secondary | ICD-10-CM | POA: Diagnosis not present

## 2022-12-31 DIAGNOSIS — Z8249 Family history of ischemic heart disease and other diseases of the circulatory system: Secondary | ICD-10-CM | POA: Diagnosis not present

## 2022-12-31 DIAGNOSIS — Z981 Arthrodesis status: Secondary | ICD-10-CM | POA: Diagnosis not present

## 2022-12-31 DIAGNOSIS — E782 Mixed hyperlipidemia: Secondary | ICD-10-CM | POA: Diagnosis present

## 2022-12-31 DIAGNOSIS — Z8672 Personal history of thrombophlebitis: Secondary | ICD-10-CM | POA: Diagnosis not present

## 2022-12-31 DIAGNOSIS — K760 Fatty (change of) liver, not elsewhere classified: Secondary | ICD-10-CM | POA: Diagnosis present

## 2022-12-31 DIAGNOSIS — Z6835 Body mass index (BMI) 35.0-35.9, adult: Secondary | ICD-10-CM | POA: Diagnosis not present

## 2022-12-31 DIAGNOSIS — Z91018 Allergy to other foods: Secondary | ICD-10-CM | POA: Diagnosis not present

## 2022-12-31 DIAGNOSIS — J4520 Mild intermittent asthma, uncomplicated: Secondary | ICD-10-CM | POA: Diagnosis present

## 2022-12-31 DIAGNOSIS — K853 Drug induced acute pancreatitis without necrosis or infection: Secondary | ICD-10-CM

## 2022-12-31 DIAGNOSIS — G2581 Restless legs syndrome: Secondary | ICD-10-CM | POA: Diagnosis present

## 2022-12-31 DIAGNOSIS — K219 Gastro-esophageal reflux disease without esophagitis: Secondary | ICD-10-CM | POA: Diagnosis present

## 2022-12-31 DIAGNOSIS — F411 Generalized anxiety disorder: Secondary | ICD-10-CM | POA: Diagnosis present

## 2022-12-31 DIAGNOSIS — K859 Acute pancreatitis without necrosis or infection, unspecified: Secondary | ICD-10-CM | POA: Diagnosis present

## 2022-12-31 DIAGNOSIS — Z825 Family history of asthma and other chronic lower respiratory diseases: Secondary | ICD-10-CM | POA: Diagnosis not present

## 2022-12-31 DIAGNOSIS — Z86711 Personal history of pulmonary embolism: Secondary | ICD-10-CM | POA: Diagnosis not present

## 2022-12-31 DIAGNOSIS — F3181 Bipolar II disorder: Secondary | ICD-10-CM | POA: Diagnosis present

## 2022-12-31 DIAGNOSIS — Z7985 Long-term (current) use of injectable non-insulin antidiabetic drugs: Secondary | ICD-10-CM | POA: Diagnosis not present

## 2022-12-31 DIAGNOSIS — Z86718 Personal history of other venous thrombosis and embolism: Secondary | ICD-10-CM | POA: Diagnosis not present

## 2022-12-31 DIAGNOSIS — Z818 Family history of other mental and behavioral disorders: Secondary | ICD-10-CM | POA: Diagnosis not present

## 2022-12-31 DIAGNOSIS — Z885 Allergy status to narcotic agent status: Secondary | ICD-10-CM | POA: Diagnosis not present

## 2022-12-31 DIAGNOSIS — Z882 Allergy status to sulfonamides status: Secondary | ICD-10-CM | POA: Diagnosis not present

## 2022-12-31 LAB — CBC
HCT: 36.1 % (ref 36.0–46.0)
Hemoglobin: 11.6 g/dL — ABNORMAL LOW (ref 12.0–15.0)
MCH: 28.8 pg (ref 26.0–34.0)
MCHC: 32.1 g/dL (ref 30.0–36.0)
MCV: 89.6 fL (ref 80.0–100.0)
Platelets: 313 10*3/uL (ref 150–400)
RBC: 4.03 MIL/uL (ref 3.87–5.11)
RDW: 13.7 % (ref 11.5–15.5)
WBC: 10.8 10*3/uL — ABNORMAL HIGH (ref 4.0–10.5)
nRBC: 0 % (ref 0.0–0.2)

## 2022-12-31 LAB — COMPREHENSIVE METABOLIC PANEL
ALT: 17 U/L (ref 0–44)
AST: 16 U/L (ref 15–41)
Albumin: 3.5 g/dL (ref 3.5–5.0)
Alkaline Phosphatase: 91 U/L (ref 38–126)
Anion gap: 12 (ref 5–15)
BUN: 9 mg/dL (ref 6–20)
CO2: 23 mmol/L (ref 22–32)
Calcium: 9 mg/dL (ref 8.9–10.3)
Chloride: 105 mmol/L (ref 98–111)
Creatinine, Ser: 0.7 mg/dL (ref 0.44–1.00)
GFR, Estimated: >60 mL/min (ref >60)
Glucose, Bld: 93 mg/dL (ref 70–99)
Potassium: 3.7 mmol/L (ref 3.5–5.1)
Sodium: 140 mmol/L (ref 135–145)
Total Bilirubin: 0.6 mg/dL (ref 0.3–1.2)
Total Protein: 6.4 g/dL — ABNORMAL LOW (ref 6.5–8.1)

## 2022-12-31 LAB — TRIGLYCERIDES: Triglycerides: 126 mg/dL (ref <150)

## 2022-12-31 LAB — HIV ANTIBODY (ROUTINE TESTING W REFLEX): HIV Screen 4th Generation wRfx: NONREACTIVE

## 2022-12-31 LAB — MAGNESIUM: Magnesium: 1.8 mg/dL (ref 1.7–2.4)

## 2022-12-31 MED ORDER — ENOXAPARIN SODIUM 40 MG/0.4ML IJ SOSY
40.0000 mg | PREFILLED_SYRINGE | INTRAMUSCULAR | Status: DC
  Administered 2022-12-31 – 2023-01-04 (×5): 40 mg via SUBCUTANEOUS
  Filled 2022-12-31 (×5): qty 0.4

## 2022-12-31 MED ORDER — MORPHINE SULFATE (PF) 4 MG/ML IV SOLN
4.0000 mg | INTRAVENOUS | Status: DC | PRN
  Administered 2022-12-31 – 2023-01-01 (×7): 4 mg via INTRAVENOUS
  Filled 2022-12-31 (×7): qty 1

## 2022-12-31 MED ORDER — HYDROMORPHONE HCL 1 MG/ML IJ SOLN
0.5000 mg | INTRAMUSCULAR | Status: DC | PRN
  Filled 2022-12-31: qty 1

## 2022-12-31 MED ORDER — ACETAMINOPHEN 325 MG PO TABS: 650.0000 mg | ORAL_TABLET | Freq: Four times a day (QID) | ORAL | Status: DC | PRN

## 2022-12-31 MED ORDER — EYE WASH OP SOLN
1.0000 [drp] | OPHTHALMIC | Status: DC | PRN
  Administered 2022-12-31: 1 [drp] via OPHTHALMIC
  Filled 2022-12-31: qty 118

## 2022-12-31 MED ORDER — MORPHINE SULFATE (PF) 2 MG/ML IV SOLN
2.0000 mg | INTRAVENOUS | Status: DC | PRN
  Administered 2022-12-31: 2 mg via INTRAVENOUS
  Administered 2022-12-31: 4 mg via INTRAVENOUS
  Filled 2022-12-31: qty 2
  Filled 2022-12-31: qty 1

## 2022-12-31 MED ORDER — TRAZODONE HCL 100 MG PO TABS
100.0000 mg | ORAL_TABLET | Freq: Every evening | ORAL | Status: DC | PRN
  Administered 2023-01-03: 100 mg via ORAL
  Filled 2022-12-31: qty 1

## 2022-12-31 MED ORDER — ACETAMINOPHEN 650 MG RE SUPP: 650.0000 mg | Freq: Four times a day (QID) | RECTAL | Status: DC | PRN

## 2022-12-31 MED ORDER — LAMOTRIGINE 25 MG PO TABS: 25.0000 mg | ORAL_TABLET | Freq: Every day | ORAL | Status: DC

## 2022-12-31 MED ORDER — SODIUM CHLORIDE 0.9 % IV SOLN: 12.5000 mg | Freq: Four times a day (QID) | INTRAVENOUS | Status: DC | PRN

## 2022-12-31 MED ORDER — PROCHLORPERAZINE EDISYLATE 10 MG/2ML IJ SOLN
5.0000 mg | INTRAMUSCULAR | Status: DC | PRN
  Administered 2022-12-31 – 2023-01-03 (×13): 5 mg via INTRAVENOUS
  Filled 2022-12-31 (×13): qty 2

## 2022-12-31 MED ORDER — SERTRALINE HCL 100 MG PO TABS: 100.0000 mg | ORAL_TABLET | Freq: Every day | ORAL | Status: DC

## 2022-12-31 MED ORDER — LACTATED RINGERS IV SOLN: INTRAVENOUS | Status: AC

## 2022-12-31 MED ORDER — OXYCODONE HCL 5 MG PO TABS
5.0000 mg | ORAL_TABLET | ORAL | Status: DC | PRN
  Administered 2022-12-31 – 2023-01-03 (×9): 5 mg via ORAL
  Filled 2022-12-31 (×9): qty 1

## 2022-12-31 MED ORDER — SODIUM CHLORIDE 0.9% FLUSH
3.0000 mL | Freq: Two times a day (BID) | INTRAVENOUS | Status: DC
  Administered 2022-12-31 – 2023-01-02 (×4): 3 mL via INTRAVENOUS

## 2022-12-31 MED ORDER — ALBUTEROL SULFATE (2.5 MG/3ML) 0.083% IN NEBU: 2.5000 mg | INHALATION_SOLUTION | Freq: Four times a day (QID) | RESPIRATORY_TRACT | Status: DC | PRN

## 2022-12-31 MED ORDER — LORAZEPAM 0.5 MG PO TABS
0.5000 mg | ORAL_TABLET | Freq: Every day | ORAL | Status: DC | PRN
  Administered 2023-01-02: 0.5 mg via ORAL
  Filled 2022-12-31: qty 1

## 2022-12-31 NOTE — Plan of Care (Signed)
  Problem: Education: Goal: Knowledge of General Education information will improve Description: Including pain rating scale, medication(s)/side effects and non-pharmacologic comfort measures Outcome: Progressing   Problem: Activity: Goal: Risk for activity intolerance will decrease Outcome: Progressing   

## 2022-12-31 NOTE — ED Notes (Signed)
[facility] at bedside 

## 2022-12-31 NOTE — Progress Notes (Signed)
Pt has arrived to unit from [facility], via [facility], on stretcher. She is warm, dry, no visible distress.  

## 2022-12-31 NOTE — H&P (Signed)
History and Physical    Casey Jordan ZOX:[id number] DOB: 06/10/1966 DOA: 12/30/2022  PCP: Bailey Mech, PA-C   Patient coming from: Home   Chief Complaint: Abdominal pain, nausea   HPI: Casey Jordan is a 57 y.o. female with medical history significant for asthma, depression, anxiety, history of provoked DVT and PE, and BMI 35 who presents to the emergency department with abdominal pain and nausea.  Patient reports 2 days of pain in the right upper quadrant and epigastrium.  She has had nausea but no vomiting associated with this.  Pain was much worse after attempting to eat today.  There is no associated diarrhea, fever, or chills.  She has never experienced this previously.  She has had a cholecystectomy and denies any alcohol use.  [facility] ED Course: Upon arrival to the ED, patient is found to be afebrile and saturating well on room air with stable blood pressure.  Faint edema is noted about the pancreatic head on CT, suggestive of mild acute pancreatitis.  Patient was given a liter of normal saline, Phenergan, and 4 doses of Dilaudid in the ED.  She was transferred to [facility] for admission.  Review of Systems:  All other systems reviewed and apart from HPI, are negative.  Past Medical History:  Diagnosis Date   Anemia    Asthma    Bipolar 2 disorder, major depressive episode (HCC) 11/28/2020   Cardiac murmur 04/30/2020   Depression    Dizziness 07/05/2018   Formatting of this note might be different from the original. Onset December 2019.  MRI of the brain okay.  Last Assessment & Plan:  Formatting of this note might be different from the original. Complex disease history.  Onset back in December.  Since then she has experienced syncopal episodes on at least 2 occasions.  Frequent spinning episodes and almost constant unsteadiness that is improved wi   DVT (deep venous thrombosis) (HCC)    lower leg while traveling went to lung   Fatty liver  03/14/2021   GAD (generalized anxiety disorder) 08/06/2017   Generalized anxiety disorder 08/06/2017   GERD (gastroesophageal reflux disease)    Headache    Migraines   History of deep venous thrombosis (DVT) of distal vein of right lower extremity 12/21/2016   Formatting of this note might be different from the original. Anticoagulation x 6 months; no recurrence.  Dx 2010 Formatting of this note might be different from the original. Overview:  Anticoagulation x 6 months; no recurrence.  Dx 2010   History of fusion of cervical spine 12/21/2016   Formatting of this note might be different from the original. 2011-2012 Formatting of this note might be different from the original. Overview:  2011-2012   History of kidney stones    Increased liver enzymes 03/14/2021   Left ureteral calculus 11/18/2017   Major depressive disorder, recurrent episode, moderate (HCC) 12/28/2019   Major depressive disorder, single episode, severe with psychotic features (HCC) 06/18/2020   Mild intermittent asthma without complication 08/06/2017   Mixed hyperlipidemia 06/29/2017   Formatting of this note might be different from the original. 10 year CVD risk 5%   Morbid obesity (HCC) 04/30/2020   Nausea and vomiting 06/25/2018   Obesity (BMI 30-39.9) 12/21/2016   Post-traumatic stress disorder, unspecified 12/21/2018   Posttraumatic stress disorder 06/18/2020   Prediabetes 03/14/2021   Preop cardiovascular exam 04/30/2020   Restless leg syndrome 12/21/2016   S/P cholecystectomy 03/14/2021   Severe recurrent major depression without  psychotic features (HCC) 01/05/2013   Formatting of this note might be different from the original. 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 06/2018: Noted on [place] RUE   Uterine fibroid 03/14/2021   Formatting of this note might be different from  the original. 02/2021: Stable 4 cm posterior fundal fibroid   Vaginal Pap smear, abnormal    Vitamin D insufficiency     Past Surgical History:  Procedure Laterality Date   CHOLECYSTECTOMY     COLONOSCOPY     COLPOSCOPY     HYSTEROSCOPY WITH D & C N/A 04/02/2017   Procedure: DILATATION AND CURETTAGE /HYSTEROSCOPY;  Surgeon: Mitchel Honour, DO;  Location: [facility];  Service: Gynecology;  Laterality: N/A;   KIDNEY STONE SURGERY     OVARY SURGERY     POSTERIOR FUSION CERVICAL SPINE     TONSILLECTOMY      Social History:   reports that she has never smoked. She has never used smokeless tobacco. She reports that she does not currently use alcohol. She reports that she does not use drugs.  Allergies  Allergen Reactions   Codeine Nausea And Vomiting and Rash   Naproxen Rash, Shortness Of Breath, Swelling and Other (See Comments)   Avocado Swelling and Other (See Comments)    Stomach pain, cause tongue and throat swelling   Sulfa Antibiotics Other (See Comments)    Gets boils   Meloxicam Other (See Comments)    Heart racing/irregularity   Sulfamethoxazole Other (See Comments)    Boils    Sulfasalazine Other (See Comments)    Gets boils   Fluconazole Palpitations and Other (See Comments)    Swelling in mucosa    Nickel Rash   Ondansetron Itching, Rash and Other (See Comments)    Other reaction(s): Unknown   Penicillin G Rash and Other (See Comments)   Penicillins Rash and Other (See Comments)    Has patient had a PCN reaction causing immediate rash, facial/tongue/throat swelling, SOB or lightheadedness with hypotension: No Has patient had a PCN reaction causing severe rash involving mucus membranes or skin necrosis: No Has patient had a PCN reaction that required hospitalization No Has patient had a PCN reaction occurring within the last 10 years: No If all of the above answers are "NO", then may proceed with Cephalosporin use.     Sulfamethoxazole-Trimethoprim Rash    Family  History  Problem Relation Age of Onset   Kidney disease Mother    COPD Mother    Alcohol abuse Mother    Diabetes Mother    Hyperlipidemia Father    Hypertension Father    Hearing loss Father    Alcohol abuse Father    Diabetes Father    Heart disease Father    Kidney disease Father    COPD Sister    Hearing loss Brother    Birth defects Brother    Cancer Brother    Alcohol abuse Brother    Depression Son    Alcohol abuse Son    Alcohol abuse Maternal Grandmother    Stroke Maternal Grandfather    Anxiety disorder Maternal Grandfather    Miscarriages / Stillbirths Paternal Grandmother    Early death Paternal Grandmother    Diabetes Paternal Grandfather      Prior to Admission medications   Medication Sig Start Date End Date Taking? Authorizing Provider  albuterol (VENTOLIN  HFA) 108 (90 Base) MCG/ACT inhaler Inhale 2 puffs into the lungs every 6 (six) hours as needed for wheezing or shortness of breath. 03/06/20   Anders Simmonds, PA-C  ALPRAZolam Prudy Feeler) 0.5 MG tablet Take 0.5 mg by mouth as needed for anxiety (flying). 08/31/22   [provider]  Baclofen 5 MG TABS Take 5 mg by mouth 3 (three) times daily as needed (muscle spasms). 01/02/22   [provider]  busPIRone (BUSPAR) 15 MG tablet Take 15 mg by mouth 3 (three) times daily. 09/08/22   [provider]  cyanocobalamin (VITAMIN B12) 1000 MCG/ML injection Inject 1,000 mcg into the muscle every 28 (twenty-eight) days. 11/10/22   [provider]  dicyclomine (BENTYL) 20 MG tablet Take 1 tablet (20 mg total) by mouth 2 (two) times daily. 09/05/21   Palumbo, April, MD  famotidine (PEPCID) 20 MG tablet Take 1 tablet (20 mg total) by mouth 2 (two) times daily. Needs OV for additional refills. 01/28/21   Hoy Register, MD  fluticasone (FLOVENT HFA) 110 MCG/ACT inhaler Inhale 1 puff into the lungs as needed (shortness of breath). 07/28/21   [provider]  gabapentin (NEURONTIN) 300 MG  capsule Take 300 mg by mouth 3 (three) times daily.    [provider]  HYDROcodone-acetaminophen (NORCO) 5-325 MG tablet Take 1 tablet by mouth every 6 (six) hours as needed for moderate pain. 12/11/22   Horton, Clabe Seal, DO  lamoTRIgine (LAMICTAL) 25 MG tablet Take 25 mg by mouth at bedtime. 01/24/22   [provider]  lidocaine (LIDODERM) 5 % Place 1 patch onto the skin as needed (back pain). 06/28/22   [provider]  LORazepam (ATIVAN) 0.5 MG tablet Take 1 tablet (0.5 mg total) by mouth daily. Patient not taking: Reported on 12/11/2022 09/03/21   Shanna Cisco, NP  meclizine (ANTIVERT) 25 MG tablet Take 1 tablet (25 mg total) by mouth 3 (three) times daily as needed for dizziness. 01/28/21   Hoy Register, MD  metoCLOPramide (REGLAN) 10 MG tablet Take 1 tablet (10 mg total) by mouth every 8 (eight) hours as needed for nausea. 12/11/22   Horton, Clabe Seal, DO  MOUNJARO 10 MG/0.5ML Pen Inject 10 mg into the skin once a week. Sundays 09/17/22   [provider]  Multiple Vitamin (MULTIVITAMIN WITH MINERALS) TABS tablet Take 1 tablet by mouth daily.    [provider]  omeprazole (PRILOSEC) 40 MG capsule Take 1 capsule (40 mg total) by mouth 2 (two) times daily. 01/28/21   Hoy Register, MD  pantoprazole (PROTONIX) 40 MG tablet Take 1 tablet (40 mg total) by mouth 2 (two) times daily before a meal. Patient not taking: Reported on 12/11/2022 01/10/21   Olive Bass, FNP  PARoxetine (PAXIL) 20 MG tablet TAKE 1 TABLET EVERY DAY Patient not taking: Reported on 12/11/2022 04/13/22   Meta Hatchet, PA  promethazine (PHENERGAN) 25 MG tablet Take 25 mg by mouth as needed for nausea or vomiting. 04/08/21   [provider]  QUEtiapine Fumarate 150 MG TABS TAKE 1 TABLET AT BEDTIME Patient taking differently: Take 150 mg by mouth at bedtime as needed (stress). 04/13/22   Nwoko, Tommas Olp, PA  rosuvastatin (CRESTOR) 20 MG tablet Take 20 mg by mouth  daily.    [provider]  sertraline (ZOLOFT) 100 MG tablet Take 100 mg by mouth daily. 09/09/22   [provider]  traZODone (DESYREL) 100 MG tablet TAKE 1 TABLET AT BEDTIME Patient taking differently:  Take 100 mg by mouth at bedtime as needed for sleep. 04/13/22   Meta Hatchet, PA    Physical Exam: Vitals:   12/30/22 2005 12/30/22 2030 12/30/22 2153 12/31/22 0148  BP: 121/81 106/69 106/63 106/73  Pulse: 83 80 85 85  Resp: 13 16 13 16   Temp:    98 F (36.7 C)  TempSrc:    Oral  SpO2: 100% 100% 100% 97%  Weight:      Height:         Constitutional: NAD, no pallor or diaphoresis  Eyes: PERTLA, lids and conjunctivae normal ENMT: Mucous membranes are moist. Posterior pharynx clear of any exudate or lesions.   Neck: supple, no masses  Respiratory: no wheezing, no crackles. No accessory muscle use.  Cardiovascular: S1 & S2 heard, regular rate and rhythm. No extremity edema.  Abdomen: Soft, no distension, tender in RUQ and epigastrium without rebound pain or guarding. Bowel sounds active.  Musculoskeletal: no clubbing / cyanosis. No joint deformity upper and lower extremities.   Skin: no significant rashes, lesions, ulcers. Warm, dry, well-perfused. Neurologic: CN 2-12 grossly intact. Moving all extremities. Alert and oriented.  Psychiatric: Calm. Cooperative.    Labs and Imaging on Admission: I have personally reviewed following labs and imaging studies  CBC: Recent Labs  Lab 12/30/22 1545  WBC 12.5*  HGB 13.2  HCT 39.9  MCV 87.5  PLT 374   Basic Metabolic Panel: Recent Labs  Lab 12/30/22 1545  NA 136  K 3.8  CL 103  CO2 24  GLUCOSE 107*  BUN 12  CREATININE 0.80  CALCIUM 9.0   GFR: Estimated Creatinine Clearance: 89.9 mL/min (by C-G formula based on SCr of 0.8 mg/dL). Liver Function Tests: Recent Labs  Lab 12/30/22 1545  AST 19  ALT 20  ALKPHOS 104  BILITOT 0.6  PROT 7.6  ALBUMIN 4.0   Recent Labs  Lab 12/30/22 1545  LIPASE 49    No results for input(s): "AMMONIA" in the last 168 hours. Coagulation Profile: No results for input(s): "INR", "PROTIME" in the last 168 hours. Cardiac Enzymes: No results for input(s): "CKTOTAL", "CKMB", "CKMBINDEX", "TROPONINI" in the last 168 hours. BNP (last 3 results) No results for input(s): "PROBNP" in the last 8760 hours. HbA1C: No results for input(s): "HGBA1C" in the last 72 hours. CBG: No results for input(s): "GLUCAP" in the last 168 hours. Lipid Profile: No results for input(s): "CHOL", "HDL", "LDLCALC", "TRIG", "CHOLHDL", "LDLDIRECT" in the last 72 hours. Thyroid Function Tests: No results for input(s): "TSH", "T4TOTAL", "FREET4", "T3FREE", "THYROIDAB" in the last 72 hours. Anemia Panel: No results for input(s): "VITAMINB12", "FOLATE", "FERRITIN", "TIBC", "IRON", "RETICCTPCT" in the last 72 hours. Urine analysis:    Component Value Date/Time   COLORURINE YELLOW 12/30/2022 1545   APPEARANCEUR CLEAR 12/30/2022 1545   LABSPEC >=1.030 12/30/2022 1545   PHURINE 5.5 12/30/2022 1545   GLUCOSEU 100 (A) 12/30/2022 1545   HGBUR NEGATIVE 12/30/2022 1545   BILIRUBINUR NEGATIVE 12/30/2022 1545   BILIRUBINUR negative 04/05/2020 1600   KETONESUR NEGATIVE 12/30/2022 1545   PROTEINUR NEGATIVE 12/30/2022 1545   UROBILINOGEN 0.2 04/05/2020 1600   UROBILINOGEN 0.2 11/28/2013 0156   NITRITE NEGATIVE 12/30/2022 1545   LEUKOCYTESUR NEGATIVE 12/30/2022 1545   Sepsis Labs: @LABRCNTIP (procalcitonin:4,lacticidven:4) )No results found for this or any previous visit (from the past 240 hour(s)).   Radiological Exams on Admission: CT ABDOMEN PELVIS W CONTRAST  Result Date: 12/30/2022 CLINICAL DATA:  Abdominal pain, acute, nonlocalized right diffuse abd pain, lower cp, cough,  tachycardic EXAM: CT ABDOMEN AND PELVIS WITH CONTRAST TECHNIQUE: Multidetector CT imaging of the abdomen and pelvis was performed using the standard protocol following bolus administration of intravenous contrast.  Performed in conjunction with CTA of the chest. RADIATION DOSE REDUCTION: This exam was performed according to the departmental dose-optimization program which includes automated exposure control, adjustment of the mA and/or kV according to patient size and/or use of iterative reconstruction technique. CONTRAST:  OMNIPAQUE IOHEXOL 350 MG/ML SOLN COMPARISON:  CT 09/05/2021 FINDINGS: Lower chest: Assessed on concurrent chest CT, reported separately. Hepatobiliary: The liver is enlarged spanning 20 cm cranial caudal. No focal liver abnormality. Prior cholecystectomy. Common bile duct at 10 mm, normal for prior cholecystectomy. Pancreas: There may be mild faint edema adjacent to the pancreatic head and uncinate process, series 9, image 33. No ductal dilatation. No pancreatic mass. Spleen: Normal in size without focal abnormality. Adrenals/Urinary Tract: Normal adrenal glands. No hydronephrosis or perinephric edema. Early excretion of IV contrast due to IV malfunction. Homogeneous renal enhancement with symmetric excretion on delayed phase imaging. No renal stone or focal renal abnormality. Urinary bladder is partially distended with excreted IV contrast, unremarkable for degree of distension. Stomach/Bowel: Minimal hiatal hernia. The stomach is otherwise unremarkable. No bowel obstruction or inflammation. Multifocal colonic diverticulosis without diverticulitis. Normal appendix. Vascular/Lymphatic: Normal caliber abdominal aorta. Scattered small central mesenteric lymph nodes, not enlarged by size criteria. Reproductive: Retroverted uterus with posterior fundal fibroid. No adnexal mass. Other: No free air, free fluid, or intra-abdominal fluid collection. Musculoskeletal: Lumbar degenerative disc disease and facet hypertrophy. There are no acute or suspicious osseous abnormalities. IMPRESSION: 1. Minimal faint edema adjacent to the pancreatic head may represent mild acute pancreatitis. 2. Colonic diverticulosis  without diverticulitis. 3. Uterine fibroid. Electronically Signed   By: Narda Rutherford M.D.   On: 12/30/2022 20:35   CT Angio Chest PE W and/or Wo Contrast  Result Date: 12/30/2022 CLINICAL DATA:  Pulmonary embolism (PE) suspected, high prob right lower cp, abd pain, worse with inspiration, hx of PE not anticoagulated, tachycardic EXAM: CT ANGIOGRAPHY CHEST WITH CONTRAST TECHNIQUE: Multidetector CT imaging of the chest was performed using the standard protocol during bolus administration of intravenous contrast. Multiplanar CT image reconstructions and MIPs were obtained to evaluate the vascular anatomy. Performed in conjunction with CT of the abdomen and pelvis. RADIATION DOSE REDUCTION: This exam was performed according to the departmental dose-optimization program which includes automated exposure control, adjustment of the mA and/or kV according to patient size and/or use of iterative reconstruction technique. CONTRAST:  OMNIPAQUE IOHEXOL 350 MG/ML SOLN COMPARISON:  Chest CTA 06/24/2022, no interval imaging available. FINDINGS: Cardiovascular: There are no filling defects within the pulmonary arteries to suggest pulmonary embolus. No aortic dissection or aneurysm. The heart is normal in size. No pericardial effusion. Mediastinum/Nodes: No mediastinal or hilar adenopathy. Patulous esophagus but no wall thickening. Small hiatal hernia. Lungs/Pleura: Clear lungs. No focal airspace disease, pleural effusion or features of pulmonary edema. The trachea and central airways are clear. No pneumothorax. Upper Abdomen: Assessed on concurrent abdominal CT, reported separately. Musculoskeletal: There are no acute or suspicious osseous abnormalities. Lower cervical spine hardware is partially included. Minor thoracic spondylosis with spurring. No chest wall soft tissue abnormalities. Review of the MIP images confirms the above findings. IMPRESSION: No pulmonary embolus or acute intrathoracic abnormality.  Electronically Signed   By: Narda Rutherford M.D.   On: 12/30/2022 20:07    EKG: Independently reviewed. Sinus rhythm.   Assessment/Plan   1. Acute pancreatitis  -  Check triglycerides, continue bowel-rest, IVF hydration, and pain-control   2. Asthma  - Not in exacerbation on admission  - Albuterol as needed    3. Depression, anxiety  - Plan to continue home regimen pending pharmacy medication reconciliation    DVT prophylaxis: Lovenox  Code Status: Full  Level of Care: Level of care: Med-Surg Family Communication: none present  Disposition Plan:  Patient is from: home  Anticipated d/c is to: home  Anticipated d/c date is: 01/02/23  Patient currently: pending pain-control, tolerance of adequate oral intake  Consults called: None  Admission status: Inpatient     Briscoe Deutscher, MD [facility]  12/31/2022, 3:10 AM

## 2022-12-31 NOTE — TOC CM/SW Note (Signed)
Transition of Care [facility]) - Inpatient Brief Assessment  Patient Details  Name: NIAMH RADA MRN: [id number] Date of Birth: [birth year]  Transition of Care [facility]) CM/SW Contact:    Ewing Schlein, LCSW Phone Number: 12/31/2022, 11:03 AM  Clinical Narrative: Screening completed. No TOC needs identified at this time.  Transition of Care Asessment: Insurance and Status: Insurance coverage has been reviewed Patient has primary care physician: Yes Home environment has been reviewed: Resides at home Prior level of function:: Independent at baseline Prior/Current Home Services: No current home services Social Determinants of Health Reivew: SDOH reviewed no interventions necessary Readmission risk has been reviewed: Yes Transition of care needs: no transition of care needs at this time

## 2022-12-31 NOTE — Progress Notes (Signed)
[facility] admission was notified of pt's arrival by paging them at [phone]. They shared that they have already paged the doctor for this pt.

## 2022-12-31 NOTE — Plan of Care (Signed)
  Problem: Coping: Goal: Level of anxiety will decrease Outcome: Progressing   Problem: Elimination: Goal: Will not experience complications related to bowel motility Outcome: Progressing   Problem: Pain Managment: Goal: General experience of comfort will improve Outcome: Progressing   

## 2022-12-31 NOTE — Progress Notes (Signed)
PROGRESS NOTE    Casey Jordan  ZOX:[id number] DOB: [birth year] DOA: 12/30/2022 PCP: Bailey Mech, PA-C     Brief Narrative:  Casey Jordan is a 57 y.o. female with medical history significant for asthma, depression, anxiety, history of provoked DVT and PE, and BMI 35 who presents to the emergency department with abdominal pain and nausea. Patient reports 2 days of pain in the right upper quadrant and epigastrium.  She has had nausea but no vomiting associated with this.  Pain was much worse after attempting to eat today.  There is no associated diarrhea, fever, or chills.  She has never experienced this previously. She has had a cholecystectomy and denies any regular alcohol use.  New events last 24 hours / Subjective: Patient continues to have epigastric and right-sided abdominal pain, relieved by pressure, laying on it.  Dilaudid has not helped.  Morphine 4 mg has been helpful.  No vomiting.  Patient reports that she has lost about 40 pounds on Mounjaro.  However, insurance is no longer covering this medication and she has been taking it on an intermittent basis.  Assessment & Plan:   Principal Problem:   Acute pancreatitis Active Problems:   Asthma   Depression   GAD (generalized anxiety disorder)   Acute pancreatitis -With clinical symptoms of abdominal pain as well as CT findings of inflammation -Patient is status post cholecystectomy, denies any regular alcohol use, TG within normal limits -Likely medication induced, Mounjaro.  This medication has now been added to her allergy list -Supportive care including IV fluid, clear liquid diet, pain control  Asthma -Stable  Depression/anxiety -Continue home medication   DVT prophylaxis:  enoxaparin (LOVENOX) injection 40 mg Start: 12/31/22 1000  Code Status: Full code Family Communication: None at bedside Disposition Plan: Home Status is: Inpatient Remains inpatient appropriate because: IV  fluid    Antimicrobials:  Anti-infectives (From admission, onward)    None        Objective: Vitals:   12/30/22 2030 12/30/22 2153 12/31/22 0148 12/31/22 0530  BP: 106/69 106/63 106/73 108/70  Pulse: 80 85 85 76  Resp: 16 13 16 16   Temp:   98 F (36.7 C) (!) 97.5 F (36.4 C)  TempSrc:   Oral Oral  SpO2: 100% 100% 97% 98%  Weight:      Height:        Intake/Output Summary (Last 24 hours) at 12/31/2022 1256 Last data filed at 12/31/2022 0622 Gross per 24 hour  Intake 3395.47 ml  Output --  Net 3395.47 ml   Filed Weights   12/30/22 1543  Weight: 95.7 kg    Examination:  General exam: Appears calm but uncomfortable  Respiratory system: Clear to auscultation. Respiratory effort normal. No respiratory distress. No conversational dyspnea.  Cardiovascular system: S1 & S2 heard, RRR. No murmurs. No pedal edema. Gastrointestinal system: Abdomen is nondistended, soft and TTP epigastrium  Central nervous system: Alert and oriented. No focal neurological deficits. Speech clear.  Extremities: Symmetric in appearance  Skin: No rashes, lesions or ulcers on exposed skin  Psychiatry: Judgement and insight appear normal. Mood & affect appropriate.   Data Reviewed: I have personally reviewed following labs and imaging studies  CBC: Recent Labs  Lab 12/30/22 1545 12/31/22 0333  WBC 12.5* 10.8*  HGB 13.2 11.6*  HCT 39.9 36.1  MCV 87.5 89.6  PLT 374 313   Basic Metabolic Panel: Recent Labs  Lab 12/30/22 1545 12/31/22 0333  NA 136 140  K 3.8 3.7  CL 103 105  CO2 24 23  GLUCOSE 107* 93  BUN 12 9  CREATININE 0.80 0.70  CALCIUM 9.0 9.0  MG  --  1.8   GFR: Estimated Creatinine Clearance: 89.9 mL/min (by C-G formula based on SCr of 0.7 mg/dL). Liver Function Tests: Recent Labs  Lab 12/30/22 1545 12/31/22 0333  AST 19 16  ALT 20 17  ALKPHOS 104 91  BILITOT 0.6 0.6  PROT 7.6 6.4*  ALBUMIN 4.0 3.5   Recent Labs  Lab 12/30/22 1545  LIPASE 49   No results  for input(s): "AMMONIA" in the last 168 hours. Coagulation Profile: No results for input(s): "INR", "PROTIME" in the last 168 hours. Cardiac Enzymes: No results for input(s): "CKTOTAL", "CKMB", "CKMBINDEX", "TROPONINI" in the last 168 hours. BNP (last 3 results) No results for input(s): "PROBNP" in the last 8760 hours. HbA1C: No results for input(s): "HGBA1C" in the last 72 hours. CBG: No results for input(s): "GLUCAP" in the last 168 hours. Lipid Profile: Recent Labs    12/31/22 0333  TRIG 126   Thyroid Function Tests: No results for input(s): "TSH", "T4TOTAL", "FREET4", "T3FREE", "THYROIDAB" in the last 72 hours. Anemia Panel: No results for input(s): "VITAMINB12", "FOLATE", "FERRITIN", "TIBC", "IRON", "RETICCTPCT" in the last 72 hours. Sepsis Labs: No results for input(s): "PROCALCITON", "LATICACIDVEN" in the last 168 hours.  No results found for this or any previous visit (from the past 240 hour(s)).    Radiology Studies: CT ABDOMEN PELVIS W CONTRAST  Result Date: 12/30/2022 CLINICAL DATA:  Abdominal pain, acute, nonlocalized right diffuse abd pain, lower cp, cough, tachycardic EXAM: CT ABDOMEN AND PELVIS WITH CONTRAST TECHNIQUE: Multidetector CT imaging of the abdomen and pelvis was performed using the standard protocol following bolus administration of intravenous contrast. Performed in conjunction with CTA of the chest. RADIATION DOSE REDUCTION: This exam was performed according to the departmental dose-optimization program which includes automated exposure control, adjustment of the mA and/or kV according to patient size and/or use of iterative reconstruction technique. CONTRAST:  OMNIPAQUE IOHEXOL 350 MG/ML SOLN COMPARISON:  CT 09/05/2021 FINDINGS: Lower chest: Assessed on concurrent chest CT, reported separately. Hepatobiliary: The liver is enlarged spanning 20 cm cranial caudal. No focal liver abnormality. Prior cholecystectomy. Common bile duct at 10 mm, normal for  prior cholecystectomy. Pancreas: There may be mild faint edema adjacent to the pancreatic head and uncinate process, series 9, image 33. No ductal dilatation. No pancreatic mass. Spleen: Normal in size without focal abnormality. Adrenals/Urinary Tract: Normal adrenal glands. No hydronephrosis or perinephric edema. Early excretion of IV contrast due to IV malfunction. Homogeneous renal enhancement with symmetric excretion on delayed phase imaging. No renal stone or focal renal abnormality. Urinary bladder is partially distended with excreted IV contrast, unremarkable for degree of distension. Stomach/Bowel: Minimal hiatal hernia. The stomach is otherwise unremarkable. No bowel obstruction or inflammation. Multifocal colonic diverticulosis without diverticulitis. Normal appendix. Vascular/Lymphatic: Normal caliber abdominal aorta. Scattered small central mesenteric lymph nodes, not enlarged by size criteria. Reproductive: Retroverted uterus with posterior fundal fibroid. No adnexal mass. Other: No free air, free fluid, or intra-abdominal fluid collection. Musculoskeletal: Lumbar degenerative disc disease and facet hypertrophy. There are no acute or suspicious osseous abnormalities. IMPRESSION: 1. Minimal faint edema adjacent to the pancreatic head may represent mild acute pancreatitis. 2. Colonic diverticulosis without diverticulitis. 3. Uterine fibroid. Electronically Signed   By: Narda Rutherford M.D.   On: 12/30/2022 20:35   CT Angio Chest PE W and/or Wo Contrast  Result Date: 12/30/2022 CLINICAL  DATA:  Pulmonary embolism (PE) suspected, high prob right lower cp, abd pain, worse with inspiration, hx of PE not anticoagulated, tachycardic EXAM: CT ANGIOGRAPHY CHEST WITH CONTRAST TECHNIQUE: Multidetector CT imaging of the chest was performed using the standard protocol during bolus administration of intravenous contrast. Multiplanar CT image reconstructions and MIPs were obtained to evaluate the vascular anatomy.  Performed in conjunction with CT of the abdomen and pelvis. RADIATION DOSE REDUCTION: This exam was performed according to the departmental dose-optimization program which includes automated exposure control, adjustment of the mA and/or kV according to patient size and/or use of iterative reconstruction technique. CONTRAST:  OMNIPAQUE IOHEXOL 350 MG/ML SOLN COMPARISON:  Chest CTA 06/24/2022, no interval imaging available. FINDINGS: Cardiovascular: There are no filling defects within the pulmonary arteries to suggest pulmonary embolus. No aortic dissection or aneurysm. The heart is normal in size. No pericardial effusion. Mediastinum/Nodes: No mediastinal or hilar adenopathy. Patulous esophagus but no wall thickening. Small hiatal hernia. Lungs/Pleura: Clear lungs. No focal airspace disease, pleural effusion or features of pulmonary edema. The trachea and central airways are clear. No pneumothorax. Upper Abdomen: Assessed on concurrent abdominal CT, reported separately. Musculoskeletal: There are no acute or suspicious osseous abnormalities. Lower cervical spine hardware is partially included. Minor thoracic spondylosis with spurring. No chest wall soft tissue abnormalities. Review of the MIP images confirms the above findings. IMPRESSION: No pulmonary embolus or acute intrathoracic abnormality. Electronically Signed   By: Narda Rutherford M.D.   On: 12/30/2022 20:07      Scheduled Meds:  enoxaparin (LOVENOX) injection  40 mg Subcutaneous Q24H   sodium chloride flush  3 mL Intravenous Q12H   Continuous Infusions:  lactated ringers 125 mL/hr at 12/31/22 0310   promethazine (PHENERGAN) injection (IM or IVPB)       LOS: 0 days   Time spent: 25 minutes   Noralee Stain, DO [facility] 12/31/2022, 12:56 PM   Available via Epic secure chat 7am-7pm After these hours, please refer to coverage provider listed on [email or web address]

## 2023-01-01 DIAGNOSIS — K853 Drug induced acute pancreatitis without necrosis or infection: Secondary | ICD-10-CM | POA: Diagnosis not present

## 2023-01-01 LAB — CBC
HCT: 35.3 % — ABNORMAL LOW (ref 36.0–46.0)
Hemoglobin: 11.6 g/dL — ABNORMAL LOW (ref 12.0–15.0)
MCH: 29.1 pg (ref 26.0–34.0)
MCHC: 32.9 g/dL (ref 30.0–36.0)
MCV: 88.7 fL (ref 80.0–100.0)
Platelets: 315 10*3/uL (ref 150–400)
RBC: 3.98 MIL/uL (ref 3.87–5.11)
RDW: 13.3 % (ref 11.5–15.5)
WBC: 7.2 10*3/uL (ref 4.0–10.5)
nRBC: 0 % (ref 0.0–0.2)

## 2023-01-01 LAB — COMPREHENSIVE METABOLIC PANEL
ALT: 29 U/L (ref 0–44)
AST: 30 U/L (ref 15–41)
Albumin: 3.2 g/dL — ABNORMAL LOW (ref 3.5–5.0)
Alkaline Phosphatase: 93 U/L (ref 38–126)
Anion gap: 9 (ref 5–15)
BUN: 6 mg/dL (ref 6–20)
CO2: 23 mmol/L (ref 22–32)
Calcium: 8.6 mg/dL — ABNORMAL LOW (ref 8.9–10.3)
Chloride: 106 mmol/L (ref 98–111)
Creatinine, Ser: 0.58 mg/dL (ref 0.44–1.00)
GFR, Estimated: >60 mL/min (ref >60)
Glucose, Bld: 93 mg/dL (ref 70–99)
Potassium: 3.8 mmol/L (ref 3.5–5.1)
Sodium: 138 mmol/L (ref 135–145)
Total Bilirubin: 1 mg/dL (ref 0.3–1.2)
Total Protein: 6.2 g/dL — ABNORMAL LOW (ref 6.5–8.1)

## 2023-01-01 NOTE — Progress Notes (Signed)
PROGRESS NOTE    RASCHEL BUDNER  ZOX:[id number] DOB: [birth year] DOA: 12/30/2022 PCP: Bailey Mech, PA-C     Brief Narrative:  Casey Jordan is a 57 y.o. female with medical history significant for asthma, depression, anxiety, history of provoked DVT and PE, and BMI 35 who presents to the emergency department with abdominal pain and nausea. Patient reports 2 days of pain in the right upper quadrant and epigastrium.  She has had nausea but no vomiting associated with this.  Pain was much worse after attempting to eat today.  There is no associated diarrhea, fever, or chills.  She has never experienced this previously. She has had a cholecystectomy and denies any regular alcohol use.  New events last 24 hours / Subjective: Still having abdominal pain, requiring IV morphine q3-5 hours. Nausea as well, had 1 episode of vomiting yesterday on clear liquids.   Assessment & Plan:   Principal Problem:   Acute pancreatitis Active Problems:   Asthma   Depression   GAD (generalized anxiety disorder)   Acute pancreatitis -With clinical symptoms of abdominal pain as well as CT findings of inflammation -Patient is status post cholecystectomy, denies any regular alcohol use, TG within normal limits -Likely medication induced, Mounjaro.  This medication has now been added to her allergy list -Supportive care including IV fluid, clear liquid diet, pain control  Asthma -Stable  Depression/anxiety -Continue home medication   DVT prophylaxis:  enoxaparin (LOVENOX) injection 40 mg Start: 12/31/22 1000  Code Status: Full code Family Communication: None at bedside Disposition Plan: Home Status is: Inpatient Remains inpatient appropriate because: IV fluid, IV morphine     Antimicrobials:  Anti-infectives (From admission, onward)    None        Objective: Vitals:   12/31/22 0530 12/31/22 1418 12/31/22 2110 01/01/23 0610  BP: 108/70 107/66 117/70 (!) 85/55  Pulse:  76 76 80 76  Resp: 16 18 17 19   Temp: (!) 97.5 F (36.4 C) (!) 96.9 F (36.1 C) 98.1 F (36.7 C) 98.7 F (37.1 C)  TempSrc: Oral  Oral Oral  SpO2: 98% 99% 100% 94%  Weight:    99.2 kg  Height:        Intake/Output Summary (Last 24 hours) at 01/01/2023 1112 Last data filed at 01/01/2023 0900 Gross per 24 hour  Intake 1543.25 ml  Output --  Net 1543.25 ml    Filed Weights   12/30/22 1543 01/01/23 0610  Weight: 95.7 kg 99.2 kg    Examination:  General exam: Appears calm but uncomfortable  Respiratory system: Clear to auscultation. Respiratory effort normal. No respiratory distress. No conversational dyspnea.  Cardiovascular system: S1 & S2 heard, RRR. No murmurs. No pedal edema. Gastrointestinal system: Abdomen is nondistended, soft and TTP epigastrium  Central nervous system: Alert and oriented. No focal neurological deficits. Speech clear.  Extremities: Symmetric in appearance  Skin: No rashes, lesions or ulcers on exposed skin  Psychiatry: Judgement and insight appear normal. Mood & affect appropriate.   Data Reviewed: I have personally reviewed following labs and imaging studies  CBC: Recent Labs  Lab 12/30/22 1545 12/31/22 0333 01/01/23 0323  WBC 12.5* 10.8* 7.2  HGB 13.2 11.6* 11.6*  HCT 39.9 36.1 35.3*  MCV 87.5 89.6 88.7  PLT 374 313 315    Basic Metabolic Panel: Recent Labs  Lab 12/30/22 1545 12/31/22 0333 01/01/23 0323  NA 136 140 138  K 3.8 3.7 3.8  CL 103 105 106  CO2 24 23 23  GLUCOSE 107* 93 93  BUN 12 9 6   CREATININE 0.80 0.70 0.58  CALCIUM 9.0 9.0 8.6*  MG  --  1.8  --     GFR: Estimated Creatinine Clearance: 91.6 mL/min (by C-G formula based on SCr of 0.58 mg/dL). Liver Function Tests: Recent Labs  Lab 12/30/22 1545 12/31/22 0333 01/01/23 0323  AST 19 16 30   ALT 20 17 29   ALKPHOS 104 91 93  BILITOT 0.6 0.6 1.0  PROT 7.6 6.4* 6.2*  ALBUMIN 4.0 3.5 3.2*    Recent Labs  Lab 12/30/22 1545  LIPASE 49    No results for  input(s): "AMMONIA" in the last 168 hours. Coagulation Profile: No results for input(s): "INR", "PROTIME" in the last 168 hours. Cardiac Enzymes: No results for input(s): "CKTOTAL", "CKMB", "CKMBINDEX", "TROPONINI" in the last 168 hours. BNP (last 3 results) No results for input(s): "PROBNP" in the last 8760 hours. HbA1C: No results for input(s): "HGBA1C" in the last 72 hours. CBG: No results for input(s): "GLUCAP" in the last 168 hours. Lipid Profile: Recent Labs    12/31/22 0333  TRIG 126    Thyroid Function Tests: No results for input(s): "TSH", "T4TOTAL", "FREET4", "T3FREE", "THYROIDAB" in the last 72 hours. Anemia Panel: No results for input(s): "VITAMINB12", "FOLATE", "FERRITIN", "TIBC", "IRON", "RETICCTPCT" in the last 72 hours. Sepsis Labs: No results for input(s): "PROCALCITON", "LATICACIDVEN" in the last 168 hours.  No results found for this or any previous visit (from the past 240 hour(s)).    Radiology Studies: CT ABDOMEN PELVIS W CONTRAST  Result Date: 12/30/2022 CLINICAL DATA:  Abdominal pain, acute, nonlocalized right diffuse abd pain, lower cp, cough, tachycardic EXAM: CT ABDOMEN AND PELVIS WITH CONTRAST TECHNIQUE: Multidetector CT imaging of the abdomen and pelvis was performed using the standard protocol following bolus administration of intravenous contrast. Performed in conjunction with CTA of the chest. RADIATION DOSE REDUCTION: This exam was performed according to the departmental dose-optimization program which includes automated exposure control, adjustment of the mA and/or kV according to patient size and/or use of iterative reconstruction technique. CONTRAST:  OMNIPAQUE IOHEXOL 350 MG/ML SOLN COMPARISON:  CT 09/05/2021 FINDINGS: Lower chest: Assessed on concurrent chest CT, reported separately. Hepatobiliary: The liver is enlarged spanning 20 cm cranial caudal. No focal liver abnormality. Prior cholecystectomy. Common bile duct at 10 mm, normal for prior  cholecystectomy. Pancreas: There may be mild faint edema adjacent to the pancreatic head and uncinate process, series 9, image 33. No ductal dilatation. No pancreatic mass. Spleen: Normal in size without focal abnormality. Adrenals/Urinary Tract: Normal adrenal glands. No hydronephrosis or perinephric edema. Early excretion of IV contrast due to IV malfunction. Homogeneous renal enhancement with symmetric excretion on delayed phase imaging. No renal stone or focal renal abnormality. Urinary bladder is partially distended with excreted IV contrast, unremarkable for degree of distension. Stomach/Bowel: Minimal hiatal hernia. The stomach is otherwise unremarkable. No bowel obstruction or inflammation. Multifocal colonic diverticulosis without diverticulitis. Normal appendix. Vascular/Lymphatic: Normal caliber abdominal aorta. Scattered small central mesenteric lymph nodes, not enlarged by size criteria. Reproductive: Retroverted uterus with posterior fundal fibroid. No adnexal mass. Other: No free air, free fluid, or intra-abdominal fluid collection. Musculoskeletal: Lumbar degenerative disc disease and facet hypertrophy. There are no acute or suspicious osseous abnormalities. IMPRESSION: 1. Minimal faint edema adjacent to the pancreatic head may represent mild acute pancreatitis. 2. Colonic diverticulosis without diverticulitis. 3. Uterine fibroid. Electronically Signed   By: Narda Rutherford M.D.   On: 12/30/2022 20:35   CT Angio  Chest PE W and/or Wo Contrast  Result Date: 12/30/2022 CLINICAL DATA:  Pulmonary embolism (PE) suspected, high prob right lower cp, abd pain, worse with inspiration, hx of PE not anticoagulated, tachycardic EXAM: CT ANGIOGRAPHY CHEST WITH CONTRAST TECHNIQUE: Multidetector CT imaging of the chest was performed using the standard protocol during bolus administration of intravenous contrast. Multiplanar CT image reconstructions and MIPs were obtained to evaluate the vascular anatomy.  Performed in conjunction with CT of the abdomen and pelvis. RADIATION DOSE REDUCTION: This exam was performed according to the departmental dose-optimization program which includes automated exposure control, adjustment of the mA and/or kV according to patient size and/or use of iterative reconstruction technique. CONTRAST:  OMNIPAQUE IOHEXOL 350 MG/ML SOLN COMPARISON:  Chest CTA 06/24/2022, no interval imaging available. FINDINGS: Cardiovascular: There are no filling defects within the pulmonary arteries to suggest pulmonary embolus. No aortic dissection or aneurysm. The heart is normal in size. No pericardial effusion. Mediastinum/Nodes: No mediastinal or hilar adenopathy. Patulous esophagus but no wall thickening. Small hiatal hernia. Lungs/Pleura: Clear lungs. No focal airspace disease, pleural effusion or features of pulmonary edema. The trachea and central airways are clear. No pneumothorax. Upper Abdomen: Assessed on concurrent abdominal CT, reported separately. Musculoskeletal: There are no acute or suspicious osseous abnormalities. Lower cervical spine hardware is partially included. Minor thoracic spondylosis with spurring. No chest wall soft tissue abnormalities. Review of the MIP images confirms the above findings. IMPRESSION: No pulmonary embolus or acute intrathoracic abnormality. Electronically Signed   By: Narda Rutherford M.D.   On: 12/30/2022 20:07      Scheduled Meds:  enoxaparin (LOVENOX) injection  40 mg Subcutaneous Q24H   sodium chloride flush  3 mL Intravenous Q12H   Continuous Infusions:  promethazine (PHENERGAN) injection (IM or IVPB)       LOS: 1 day   Time spent: 25 minutes   Noralee Stain, DO [facility] 01/01/2023, 11:12 AM   Available via Epic secure chat 7am-7pm After these hours, please refer to coverage provider listed on [email or web address]

## 2023-01-02 DIAGNOSIS — K853 Drug induced acute pancreatitis without necrosis or infection: Secondary | ICD-10-CM | POA: Diagnosis not present

## 2023-01-02 LAB — COMPREHENSIVE METABOLIC PANEL
ALT: 25 U/L (ref 0–44)
AST: 19 U/L (ref 15–41)
Albumin: 3.3 g/dL — ABNORMAL LOW (ref 3.5–5.0)
Alkaline Phosphatase: 86 U/L (ref 38–126)
Anion gap: 8 (ref 5–15)
BUN: 7 mg/dL (ref 6–20)
CO2: 24 mmol/L (ref 22–32)
Calcium: 8.9 mg/dL (ref 8.9–10.3)
Chloride: 105 mmol/L (ref 98–111)
Creatinine, Ser: 0.69 mg/dL (ref 0.44–1.00)
GFR, Estimated: >60 mL/min (ref >60)
Glucose, Bld: 93 mg/dL (ref 70–99)
Potassium: 3.6 mmol/L (ref 3.5–5.1)
Sodium: 137 mmol/L (ref 135–145)
Total Bilirubin: 0.7 mg/dL (ref 0.3–1.2)
Total Protein: 6.5 g/dL (ref 6.5–8.1)

## 2023-01-02 LAB — CBC
HCT: 36.7 % (ref 36.0–46.0)
Hemoglobin: 11.9 g/dL — ABNORMAL LOW (ref 12.0–15.0)
MCH: 29.1 pg (ref 26.0–34.0)
MCHC: 32.4 g/dL (ref 30.0–36.0)
MCV: 89.7 fL (ref 80.0–100.0)
Platelets: 306 10*3/uL (ref 150–400)
RBC: 4.09 MIL/uL (ref 3.87–5.11)
RDW: 13.2 % (ref 11.5–15.5)
WBC: 7.5 10*3/uL (ref 4.0–10.5)
nRBC: 0 % (ref 0.0–0.2)

## 2023-01-02 MED ORDER — MORPHINE SULFATE (PF) 2 MG/ML IV SOLN: 2.0000 mg | INTRAVENOUS | Status: DC | PRN

## 2023-01-02 MED ORDER — SODIUM CHLORIDE 0.9 % IV SOLN: INTRAVENOUS | Status: DC

## 2023-01-02 NOTE — Plan of Care (Signed)
  Problem: Coping: Goal: Level of anxiety will decrease Outcome: Progressing   Problem: Pain Managment: Goal: General experience of comfort will improve Outcome: Progressing   

## 2023-01-02 NOTE — Progress Notes (Signed)
PROGRESS NOTE    Casey Jordan  YNW:[id number] DOB: [birth year] DOA: 12/30/2022 PCP: Bailey Mech, PA-C     Brief Narrative:  Casey Jordan is a 57 y.o. female with medical history significant for asthma, depression, anxiety, history of provoked DVT and PE, and BMI 35 who presents to the emergency department with abdominal pain and nausea. Patient reports 2 days of pain in the right upper quadrant and epigastrium.  She has had nausea but no vomiting associated with this.  Pain was much worse after attempting to eat today.  There is no associated diarrhea, fever, or chills.  She has never experienced this previously. She has had a cholecystectomy and denies any regular alcohol use.  New events last 24 hours / Subjective: Trial of full liquid diet today, tolerated grits but had nausea and pain with ice cream.  Still requiring some morphine as well as oxycodone.  Assessment & Plan:   Principal Problem:   Acute pancreatitis Active Problems:   Asthma   Depression   GAD (generalized anxiety disorder)   Acute pancreatitis -With clinical symptoms of abdominal pain as well as CT findings of inflammation -Patient is status post cholecystectomy, denies any regular alcohol use, TG within normal limits -Likely medication induced, Mounjaro.  This medication has now been added to her allergy list -Supportive care including IV fluid, advance to soft diet today, pain control  Asthma -Stable  Depression/anxiety -Continue home medication   DVT prophylaxis:  enoxaparin (LOVENOX) injection 40 mg Start: 12/31/22 1000  Code Status: Full code Family Communication: None at bedside Disposition Plan: Home Status is: Inpatient Remains inpatient appropriate because: IV fluid, IV morphine     Antimicrobials:  Anti-infectives (From admission, onward)    None        Objective: Vitals:   01/01/23 1347 01/01/23 2207 01/02/23 0500 01/02/23 0530  BP: (!) 99/56 (!) 92/55   103/69  Pulse: 66 73  75  Resp: 16 17  17   Temp: 97.7 F (36.5 C) 98.5 F (36.9 C)  98.4 F (36.9 C)  TempSrc:  Oral  Oral  SpO2: 100% 95%  92%  Weight:   96 kg   Height:        Intake/Output Summary (Last 24 hours) at 01/02/2023 1103 Last data filed at 01/02/2023 1044 Gross per 24 hour  Intake 780 ml  Output --  Net 780 ml    Filed Weights   12/30/22 1543 01/01/23 0610 01/02/23 0500  Weight: 95.7 kg 99.2 kg 96 kg    Examination:  General exam: Appears calm but uncomfortable  Respiratory system: Clear to auscultation. Respiratory effort normal. No respiratory distress. No conversational dyspnea.  Cardiovascular system: S1 & S2 heard, RRR. No murmurs. No pedal edema. Gastrointestinal system: Abdomen is nondistended, soft and TTP epigastrium  Central nervous system: Alert and oriented. No focal neurological deficits. Speech clear.  Extremities: Symmetric in appearance  Skin: No rashes, lesions or ulcers on exposed skin  Psychiatry: Judgement and insight appear normal. Mood & affect appropriate.   Data Reviewed: I have personally reviewed following labs and imaging studies  CBC: Recent Labs  Lab 12/30/22 1545 12/31/22 0333 01/01/23 0323 01/02/23 0355  WBC 12.5* 10.8* 7.2 7.5  HGB 13.2 11.6* 11.6* 11.9*  HCT 39.9 36.1 35.3* 36.7  MCV 87.5 89.6 88.7 89.7  PLT 374 313 315 306    Basic Metabolic Panel: Recent Labs  Lab 12/30/22 1545 12/31/22 0333 01/01/23 0323 01/02/23 0355  NA 136 140 138 137  K 3.8 3.7 3.8 3.6  CL 103 105 106 105  CO2 24 23 23 24   GLUCOSE 107* 93 93 93  BUN 12 9 6 7   CREATININE 0.80 0.70 0.58 0.69  CALCIUM 9.0 9.0 8.6* 8.9  MG  --  1.8  --   --     GFR: Estimated Creatinine Clearance: 90 mL/min (by C-G formula based on SCr of 0.69 mg/dL). Liver Function Tests: Recent Labs  Lab 12/30/22 1545 12/31/22 0333 01/01/23 0323 01/02/23 0355  AST 19 16 30 19   ALT 20 17 29 25   ALKPHOS 104 91 93 86  BILITOT 0.6 0.6 1.0 0.7  PROT 7.6  6.4* 6.2* 6.5  ALBUMIN 4.0 3.5 3.2* 3.3*    Recent Labs  Lab 12/30/22 1545  LIPASE 49    No results for input(s): "AMMONIA" in the last 168 hours. Coagulation Profile: No results for input(s): "INR", "PROTIME" in the last 168 hours. Cardiac Enzymes: No results for input(s): "CKTOTAL", "CKMB", "CKMBINDEX", "TROPONINI" in the last 168 hours. BNP (last 3 results) No results for input(s): "PROBNP" in the last 8760 hours. HbA1C: No results for input(s): "HGBA1C" in the last 72 hours. CBG: No results for input(s): "GLUCAP" in the last 168 hours. Lipid Profile: Recent Labs    12/31/22 0333  TRIG 126    Thyroid Function Tests: No results for input(s): "TSH", "T4TOTAL", "FREET4", "T3FREE", "THYROIDAB" in the last 72 hours. Anemia Panel: No results for input(s): "VITAMINB12", "FOLATE", "FERRITIN", "TIBC", "IRON", "RETICCTPCT" in the last 72 hours. Sepsis Labs: No results for input(s): "PROCALCITON", "LATICACIDVEN" in the last 168 hours.  No results found for this or any previous visit (from the past 240 hour(s)).    Radiology Studies: No results found.    Scheduled Meds:  enoxaparin (LOVENOX) injection  40 mg Subcutaneous Q24H   sodium chloride flush  3 mL Intravenous Q12H   Continuous Infusions:  sodium chloride 100 mL/hr at 01/02/23 1044   promethazine (PHENERGAN) injection (IM or IVPB)       LOS: 2 days   Time spent: 25 minutes   Noralee Stain, DO [facility] 01/02/2023, 11:03 AM   Available via Epic secure chat 7am-7pm After these hours, please refer to coverage provider listed on [email or web address]

## 2023-01-03 DIAGNOSIS — K853 Drug induced acute pancreatitis without necrosis or infection: Secondary | ICD-10-CM | POA: Diagnosis not present

## 2023-01-03 MED ORDER — FAMOTIDINE IN NACL 20-0.9 MG/50ML-% IV SOLN
20.0000 mg | Freq: Once | INTRAVENOUS | Status: AC
  Administered 2023-01-03: 20 mg via INTRAVENOUS
  Filled 2023-01-03: qty 50

## 2023-01-03 MED ORDER — ALUM & MAG HYDROXIDE-SIMETH 200-200-20 MG/5ML PO SUSP: 30.0000 mL | Freq: Four times a day (QID) | ORAL | Status: DC | PRN

## 2023-01-03 MED ORDER — TRAMADOL HCL 50 MG PO TABS
50.0000 mg | ORAL_TABLET | Freq: Four times a day (QID) | ORAL | Status: DC | PRN
  Administered 2023-01-03 – 2023-01-04 (×3): 50 mg via ORAL
  Filled 2023-01-03 (×3): qty 1

## 2023-01-03 NOTE — Progress Notes (Signed)
PROGRESS NOTE    Casey Jordan  MVH:[id number] DOB: 1966-06-17 DOA: 12/30/2022 PCP: Bailey Mech, PA-C     Brief Narrative:  Casey Jordan is a 57 y.o. female with medical history significant for asthma, depression, anxiety, history of provoked DVT and PE, and BMI 35 who presents to the emergency department with abdominal pain and nausea. Patient reports 2 days of pain in the right upper quadrant and epigastrium.  She has had nausea but no vomiting associated with this.  Pain was much worse after attempting to eat today.  There is no associated diarrhea, fever, or chills.  She has never experienced this previously. She has had a cholecystectomy and denies any regular alcohol use.  New events last 24 hours / Subjective: States that she gets nauseous with oxycodone.  Trialed some soft diet yesterday, but did not tolerate much.  She has not required IV morphine last 24 hours  Assessment & Plan:   Principal Problem:   Acute pancreatitis Active Problems:   Asthma   Depression   GAD (generalized anxiety disorder)   Acute pancreatitis -With clinical symptoms of abdominal pain as well as CT findings of inflammation -Patient is status post cholecystectomy, denies any regular alcohol use, TG within normal limits -Likely medication induced, Mounjaro.  This medication has now been added to her allergy list -Supportive care including IV fluid, continue soft diet as tolerated -Change oxycodone to tramadol  Asthma -Stable  Depression/anxiety -Continue home medication   DVT prophylaxis:  enoxaparin (LOVENOX) injection 40 mg Start: 12/31/22 1000  Code Status: Full code Family Communication: None at bedside Disposition Plan: Home Status is: Inpatient Remains inpatient appropriate because: IV fluid   Antimicrobials:  Anti-infectives (From admission, onward)    None        Objective: Vitals:   01/02/23 1349 01/02/23 2056 01/03/23 0459 01/03/23 0500  BP:  109/75 120/73 112/74   Pulse: 72 87 70   Resp: 16 17 17    Temp: 97.7 F (36.5 C) 98.9 F (37.2 C) 98.4 F (36.9 C)   TempSrc: Oral Oral Oral   SpO2: 100% 96% 97%   Weight:    95.3 kg  Height:        Intake/Output Summary (Last 24 hours) at 01/03/2023 1202 Last data filed at 01/03/2023 0900 Gross per 24 hour  Intake 2119.31 ml  Output --  Net 2119.31 ml    Filed Weights   01/01/23 0610 01/02/23 0500 01/03/23 0500  Weight: 99.2 kg 96 kg 95.3 kg    Examination:  General exam: Appears calm but uncomfortable  Respiratory system: Clear to auscultation. Respiratory effort normal. No respiratory distress. No conversational dyspnea.  Cardiovascular system: S1 & S2 heard, RRR. No murmurs. No pedal edema. Gastrointestinal system: Abdomen is nondistended, soft and TTP epigastrium  Central nervous system: Alert and oriented. No focal neurological deficits. Speech clear.  Extremities: Symmetric in appearance  Skin: No rashes, lesions or ulcers on exposed skin  Psychiatry: Judgement and insight appear normal. Mood & affect appropriate.   Data Reviewed: I have personally reviewed following labs and imaging studies  CBC: Recent Labs  Lab 12/30/22 1545 12/31/22 0333 01/01/23 0323 01/02/23 0355  WBC 12.5* 10.8* 7.2 7.5  HGB 13.2 11.6* 11.6* 11.9*  HCT 39.9 36.1 35.3* 36.7  MCV 87.5 89.6 88.7 89.7  PLT 374 313 315 306    Basic Metabolic Panel: Recent Labs  Lab 12/30/22 1545 12/31/22 0333 01/01/23 0323 01/02/23 0355  NA 136 140 138 137  K 3.8 3.7 3.8 3.6  CL 103 105 106 105  CO2 24 23 23 24   GLUCOSE 107* 93 93 93  BUN 12 9 6 7   CREATININE 0.80 0.70 0.58 0.69  CALCIUM 9.0 9.0 8.6* 8.9  MG  --  1.8  --   --     GFR: Estimated Creatinine Clearance: 89.6 mL/min (by C-G formula based on SCr of 0.69 mg/dL). Liver Function Tests: Recent Labs  Lab 12/30/22 1545 12/31/22 0333 01/01/23 0323 01/02/23 0355  AST 19 16 30 19   ALT 20 17 29 25   ALKPHOS 104 91 93 86  BILITOT  0.6 0.6 1.0 0.7  PROT 7.6 6.4* 6.2* 6.5  ALBUMIN 4.0 3.5 3.2* 3.3*    Recent Labs  Lab 12/30/22 1545  LIPASE 49    No results for input(s): "AMMONIA" in the last 168 hours. Coagulation Profile: No results for input(s): "INR", "PROTIME" in the last 168 hours. Cardiac Enzymes: No results for input(s): "CKTOTAL", "CKMB", "CKMBINDEX", "TROPONINI" in the last 168 hours. BNP (last 3 results) No results for input(s): "PROBNP" in the last 8760 hours. HbA1C: No results for input(s): "HGBA1C" in the last 72 hours. CBG: No results for input(s): "GLUCAP" in the last 168 hours. Lipid Profile: No results for input(s): "CHOL", "HDL", "LDLCALC", "TRIG", "CHOLHDL", "LDLDIRECT" in the last 72 hours.  Thyroid Function Tests: No results for input(s): "TSH", "T4TOTAL", "FREET4", "T3FREE", "THYROIDAB" in the last 72 hours. Anemia Panel: No results for input(s): "VITAMINB12", "FOLATE", "FERRITIN", "TIBC", "IRON", "RETICCTPCT" in the last 72 hours. Sepsis Labs: No results for input(s): "PROCALCITON", "LATICACIDVEN" in the last 168 hours.  No results found for this or any previous visit (from the past 240 hour(s)).    Radiology Studies: No results found.    Scheduled Meds:  enoxaparin (LOVENOX) injection  40 mg Subcutaneous Q24H   sodium chloride flush  3 mL Intravenous Q12H   Continuous Infusions:  sodium chloride 100 mL/hr at 01/02/23 1853   promethazine (PHENERGAN) injection (IM or IVPB)       LOS: 3 days   Time spent: 25 minutes   Noralee Stain, DO [facility] 01/03/2023, 12:02 PM   Available via Epic secure chat 7am-7pm After these hours, please refer to coverage provider listed on [email or web address]

## 2023-01-03 NOTE — Plan of Care (Signed)
  Problem: Coping: Goal: Level of anxiety will decrease Outcome: Progressing   Problem: Pain Managment: Goal: General experience of comfort will improve Outcome: Progressing   

## 2023-01-04 DIAGNOSIS — K853 Drug induced acute pancreatitis without necrosis or infection: Secondary | ICD-10-CM | POA: Diagnosis not present

## 2023-01-04 MED ORDER — PROMETHAZINE HCL 25 MG PO TABS: 25.0000 mg | ORAL_TABLET | Freq: Four times a day (QID) | ORAL | 0 refills | Status: AC | PRN

## 2023-01-04 MED ORDER — TRAMADOL HCL 50 MG PO TABS: 50.0000 mg | ORAL_TABLET | Freq: Four times a day (QID) | ORAL | 0 refills | Status: AC | PRN

## 2023-01-04 NOTE — Plan of Care (Signed)
  Problem: Coping: Goal: Level of anxiety will decrease Outcome: Progressing   Problem: Pain Managment: Goal: General experience of comfort will improve Outcome: Progressing   

## 2023-01-04 NOTE — Plan of Care (Signed)
Pt ready to DC home when ride arrives. 

## 2023-01-04 NOTE — Discharge Summary (Signed)
Physician Discharge Summary  Casey Jordan ZOX:[id number] DOB: [birth year] DOA: 12/30/2022  PCP: Bailey Mech, PA-C  Admit date: 12/30/2022 Discharge date: 01/04/2023  Admitted From: Home Disposition:  Home   Recommendations for Outpatient Follow-up:  Follow up with PCP in 1 week Follow up with GI Nelida Gores, PA as scheduled 7/8   Discharge Condition: Stable CODE STATUS: Full  Diet recommendation: Regular diet   Brief/Interim Summary: Casey Jordan is a 57 y.o. female with medical history significant for asthma, depression, anxiety, history of provoked DVT and PE, and BMI 35 who presents to the emergency department with abdominal pain and nausea. Patient reports 2 days of pain in the right upper quadrant and epigastrium.  She has had nausea but no vomiting associated with this.  Pain was much worse after attempting to eat today.  There is no associated diarrhea, fever, or chills.  She has never experienced this previously. She has had a cholecystectomy and denies any regular alcohol use.  Patient with clinical symptoms of abdominal pain as well as CT findings of inflammation. Patient is status post cholecystectomy, denies any regular alcohol use, TG within normal limits. Pancreatitis thought to be secondary to medication use. She was counseled to discontinue Mounjaro.  She continued to improve with supportive care, IV fluid, n.p.o.  Diet was slowly advanced.  Discharge Diagnoses:   Principal Problem:   Acute pancreatitis Active Problems:   Asthma   Depression   GAD (generalized anxiety disorder)   Discharge Instructions  Discharge Instructions     Call MD for:  difficulty breathing, headache or visual disturbances   Complete by: As directed    Call MD for:  extreme fatigue   Complete by: As directed    Call MD for:  persistant dizziness or light-headedness   Complete by: As directed    Call MD for:  persistant nausea and vomiting   Complete by: As  directed    Call MD for:  severe uncontrolled pain   Complete by: As directed    Call MD for:  temperature >100.4   Complete by: As directed    Discharge instructions   Complete by: As directed    You were cared for by a hospitalist during your hospital stay. If you have any questions about your discharge medications or the care you received while you were in the hospital after you are discharged, you can call the unit and ask to speak with the hospitalist on call if the hospitalist that took care of you is not available. Once you are discharged, your primary care physician will handle any further medical issues. Please note that NO REFILLS for any discharge medications will be authorized once you are discharged, as it is imperative that you return to your primary care physician (or establish a relationship with a primary care physician if you do not have one) for your aftercare needs so that they can reassess your need for medications and monitor your lab values.   Increase activity slowly   Complete by: As directed       Allergies as of 01/04/2023       Reactions   Codeine Nausea And Vomiting   Naproxen Rash, Shortness Of Breath, Swelling, Other (See Comments)   Avocado Swelling, Other (See Comments)   Stomach pain, cause tongue and throat swelling   Sulfa Antibiotics Other (See Comments)   Gets boils   Meloxicam Other (See Comments)   Heart racing/irregularity   Mounjaro [tirzepatide] Other (See Comments)  Pancreatitis    Sulfamethoxazole Other (See Comments)   Boils    Sulfasalazine Other (See Comments)   Gets boils   Fluconazole Palpitations, Other (See Comments)   Swelling in mucosa   Nickel Rash   Ondansetron Itching, Rash, Other (See Comments)   Other reaction(s): Unknown   Penicillin G Rash, Other (See Comments)   Penicillins Rash, Other (See Comments)   Sulfamethoxazole-trimethoprim Rash        Medication List     STOP taking these medications     HYDROcodone-acetaminophen 5-325 MG tablet Commonly known as: Norco   Mounjaro 10 MG/0.5ML Pen Generic drug: tirzepatide       TAKE these medications    albuterol 108 (90 Base) MCG/ACT inhaler Commonly known as: VENTOLIN HFA Inhale 2 puffs into the lungs every 6 (six) hours as needed for wheezing or shortness of breath.   ALPRAZolam 0.5 MG tablet Commonly known as: XANAX Take 0.5 mg by mouth as needed for anxiety (flying).   Baclofen 5 MG Tabs Take 5 mg by mouth 3 (three) times daily as needed (muscle spasms).   cyanocobalamin 1000 MCG/ML injection Commonly known as: VITAMIN B12 Inject 1,000 mcg into the muscle every 28 (twenty-eight) days.   dicyclomine 20 MG tablet Commonly known as: BENTYL Take 1 tablet (20 mg total) by mouth 2 (two) times daily.   famotidine 20 MG tablet Commonly known as: PEPCID Take 1 tablet (20 mg total) by mouth 2 (two) times daily. Needs OV for additional refills.   fluticasone 110 MCG/ACT inhaler Commonly known as: FLOVENT HFA Inhale 1 puff into the lungs as needed (shortness of breath).   gabapentin 300 MG capsule Commonly known as: NEURONTIN Take 300 mg by mouth 3 (three) times daily as needed (Nerve pain).   lamoTRIgine 25 MG tablet Commonly known as: LAMICTAL Take 25 mg by mouth at bedtime.   lidocaine 5 % Commonly known as: LIDODERM Place 1 patch onto the skin as needed (back pain).   LORazepam 0.5 MG tablet Commonly known as: ATIVAN Take 1 tablet (0.5 mg total) by mouth daily. What changed:  when to take this reasons to take this   meclizine 25 MG tablet Commonly known as: ANTIVERT Take 1 tablet (25 mg total) by mouth 3 (three) times daily as needed for dizziness.   multivitamin with minerals Tabs tablet Take 1 tablet by mouth daily.   omeprazole 40 MG capsule Commonly known as: PRILOSEC Take 1 capsule (40 mg total) by mouth 2 (two) times daily.   promethazine 25 MG tablet Commonly known as: PHENERGAN Take 1  tablet (25 mg total) by mouth every 6 (six) hours as needed for nausea or vomiting. What changed: when to take this   rosuvastatin 20 MG tablet Commonly known as: CRESTOR Take 20 mg by mouth daily.   sertraline 100 MG tablet Commonly known as: ZOLOFT Take 100 mg by mouth daily.   traMADol 50 MG tablet Commonly known as: ULTRAM Take 1 tablet (50 mg total) by mouth every 6 (six) hours as needed for up to 5 days for moderate pain.   traZODone 100 MG tablet Commonly known as: DESYREL TAKE 1 TABLET AT BEDTIME What changed:  when to take this reasons to take this        Follow-up Information     Podraza, Rudy Jew, PA-C Follow up.   Specialty: Physician Assistant Contact information: [address] [place] [address] [phone]  Allergies  Allergen Reactions   Codeine Nausea And Vomiting   Naproxen Rash, Shortness Of Breath, Swelling and Other (See Comments)   Avocado Swelling and Other (See Comments)    Stomach pain, cause tongue and throat swelling   Sulfa Antibiotics Other (See Comments)    Gets boils   Meloxicam Other (See Comments)    Heart racing/irregularity   Mounjaro [Tirzepatide] Other (See Comments)    Pancreatitis    Sulfamethoxazole Other (See Comments)    Boils    Sulfasalazine Other (See Comments)    Gets boils   Fluconazole Palpitations and Other (See Comments)    Swelling in mucosa    Nickel Rash   Ondansetron Itching, Rash and Other (See Comments)    Other reaction(s): Unknown   Penicillin G Rash and Other (See Comments)   Penicillins Rash and Other (See Comments)   Sulfamethoxazole-Trimethoprim Rash    Consultations: None    Procedures/Studies: CT ABDOMEN PELVIS W CONTRAST  Result Date: 12/30/2022 CLINICAL DATA:  Abdominal pain, acute, nonlocalized right diffuse abd pain, lower cp, cough, tachycardic EXAM: CT ABDOMEN AND PELVIS WITH CONTRAST TECHNIQUE: Multidetector CT imaging of the  abdomen and pelvis was performed using the standard protocol following bolus administration of intravenous contrast. Performed in conjunction with CTA of the chest. RADIATION DOSE REDUCTION: This exam was performed according to the departmental dose-optimization program which includes automated exposure control, adjustment of the mA and/or kV according to patient size and/or use of iterative reconstruction technique. CONTRAST:  OMNIPAQUE IOHEXOL 350 MG/ML SOLN COMPARISON:  CT 09/05/2021 FINDINGS: Lower chest: Assessed on concurrent chest CT, reported separately. Hepatobiliary: The liver is enlarged spanning 20 cm cranial caudal. No focal liver abnormality. Prior cholecystectomy. Common bile duct at 10 mm, normal for prior cholecystectomy. Pancreas: There may be mild faint edema adjacent to the pancreatic head and uncinate process, series 9, image 33. No ductal dilatation. No pancreatic mass. Spleen: Normal in size without focal abnormality. Adrenals/Urinary Tract: Normal adrenal glands. No hydronephrosis or perinephric edema. Early excretion of IV contrast due to IV malfunction. Homogeneous renal enhancement with symmetric excretion on delayed phase imaging. No renal stone or focal renal abnormality. Urinary bladder is partially distended with excreted IV contrast, unremarkable for degree of distension. Stomach/Bowel: Minimal hiatal hernia. The stomach is otherwise unremarkable. No bowel obstruction or inflammation. Multifocal colonic diverticulosis without diverticulitis. Normal appendix. Vascular/Lymphatic: Normal caliber abdominal aorta. Scattered small central mesenteric lymph nodes, not enlarged by size criteria. Reproductive: Retroverted uterus with posterior fundal fibroid. No adnexal mass. Other: No free air, free fluid, or intra-abdominal fluid collection. Musculoskeletal: Lumbar degenerative disc disease and facet hypertrophy. There are no acute or suspicious osseous abnormalities. IMPRESSION: 1.  Minimal faint edema adjacent to the pancreatic head may represent mild acute pancreatitis. 2. Colonic diverticulosis without diverticulitis. 3. Uterine fibroid. Electronically Signed   By: Narda Rutherford M.D.   On: 12/30/2022 20:35   CT Angio Chest PE W and/or Wo Contrast  Result Date: 12/30/2022 CLINICAL DATA:  Pulmonary embolism (PE) suspected, high prob right lower cp, abd pain, worse with inspiration, hx of PE not anticoagulated, tachycardic EXAM: CT ANGIOGRAPHY CHEST WITH CONTRAST TECHNIQUE: Multidetector CT imaging of the chest was performed using the standard protocol during bolus administration of intravenous contrast. Multiplanar CT image reconstructions and MIPs were obtained to evaluate the vascular anatomy. Performed in conjunction with CT of the abdomen and pelvis. RADIATION DOSE REDUCTION: This exam was performed according to the departmental dose-optimization program which includes automated exposure control,  adjustment of the mA and/or kV according to patient size and/or use of iterative reconstruction technique. CONTRAST:  OMNIPAQUE IOHEXOL 350 MG/ML SOLN COMPARISON:  Chest CTA 06/24/2022, no interval imaging available. FINDINGS: Cardiovascular: There are no filling defects within the pulmonary arteries to suggest pulmonary embolus. No aortic dissection or aneurysm. The heart is normal in size. No pericardial effusion. Mediastinum/Nodes: No mediastinal or hilar adenopathy. Patulous esophagus but no wall thickening. Small hiatal hernia. Lungs/Pleura: Clear lungs. No focal airspace disease, pleural effusion or features of pulmonary edema. The trachea and central airways are clear. No pneumothorax. Upper Abdomen: Assessed on concurrent abdominal CT, reported separately. Musculoskeletal: There are no acute or suspicious osseous abnormalities. Lower cervical spine hardware is partially included. Minor thoracic spondylosis with spurring. No chest wall soft tissue abnormalities. Review of the  MIP images confirms the above findings. IMPRESSION: No pulmonary embolus or acute intrathoracic abnormality. Electronically Signed   By: Narda Rutherford M.D.   On: 12/30/2022 20:07   DG Femur Min 2 Views Left  Result Date: 12/11/2022 CLINICAL DATA:  Left leg pain after injury. EXAM: LEFT FEMUR 2 VIEWS COMPARISON:  None Available. FINDINGS: There is no evidence of fracture or other focal bone lesions. Soft tissues are unremarkable. IMPRESSION: Negative. Electronically Signed   By: Lupita Raider M.D.   On: 12/11/2022 08:49       Discharge Exam: Vitals:   01/03/23 2116 01/04/23 0520  BP: 127/78 110/69  Pulse: 72 75  Resp: 17 17  Temp: 99.1 F (37.3 C) 98.5 F (36.9 C)  SpO2: 99% 96%    General: Pt is alert, awake, not in acute distress Cardiovascular: RRR, S1/S2 +, no edema Respiratory: CTA bilaterally, no wheezing, no rhonchi, no respiratory distress, no conversational dyspnea  Abdominal: Soft, NT, ND, bowel sounds + Extremities: no edema, no cyanosis Psych: Normal mood and affect, stable judgement and insight     The results of significant diagnostics from this hospitalization (including imaging, microbiology, ancillary and laboratory) are listed below for reference.     Microbiology: No results found for this or any previous visit (from the past 240 hour(s)).   Labs: BNP (last 3 results) No results for input(s): "BNP" in the last 8760 hours. Basic Metabolic Panel: Recent Labs  Lab 12/30/22 1545 12/31/22 0333 01/01/23 0323 01/02/23 0355  NA 136 140 138 137  K 3.8 3.7 3.8 3.6  CL 103 105 106 105  CO2 24 23 23 24   GLUCOSE 107* 93 93 93  BUN 12 9 6 7   CREATININE 0.80 0.70 0.58 0.69  CALCIUM 9.0 9.0 8.6* 8.9  MG  --  1.8  --   --    Liver Function Tests: Recent Labs  Lab 12/30/22 1545 12/31/22 0333 01/01/23 0323 01/02/23 0355  AST 19 16 30 19   ALT 20 17 29 25   ALKPHOS 104 91 93 86  BILITOT 0.6 0.6 1.0 0.7  PROT 7.6 6.4* 6.2* 6.5  ALBUMIN 4.0 3.5 3.2*  3.3*   Recent Labs  Lab 12/30/22 1545  LIPASE 49   No results for input(s): "AMMONIA" in the last 168 hours. CBC: Recent Labs  Lab 12/30/22 1545 12/31/22 0333 01/01/23 0323 01/02/23 0355  WBC 12.5* 10.8* 7.2 7.5  HGB 13.2 11.6* 11.6* 11.9*  HCT 39.9 36.1 35.3* 36.7  MCV 87.5 89.6 88.7 89.7  PLT 374 313 315 306   Cardiac Enzymes: No results for input(s): "CKTOTAL", "CKMB", "CKMBINDEX", "TROPONINI" in the last 168 hours. BNP: Invalid input(s): "POCBNP" CBG:  No results for input(s): "GLUCAP" in the last 168 hours. D-Dimer No results for input(s): "DDIMER" in the last 72 hours. Hgb A1c No results for input(s): "HGBA1C" in the last 72 hours. Lipid Profile No results for input(s): "CHOL", "HDL", "LDLCALC", "TRIG", "CHOLHDL", "LDLDIRECT" in the last 72 hours. Thyroid function studies No results for input(s): "TSH", "T4TOTAL", "T3FREE", "THYROIDAB" in the last 72 hours.  Invalid input(s): "FREET3" Anemia work up No results for input(s): "VITAMINB12", "FOLATE", "FERRITIN", "TIBC", "IRON", "RETICCTPCT" in the last 72 hours. Urinalysis    Component Value Date/Time   COLORURINE YELLOW 12/30/2022 1545   APPEARANCEUR CLEAR 12/30/2022 1545   LABSPEC >=1.030 12/30/2022 1545   PHURINE 5.5 12/30/2022 1545   GLUCOSEU 100 (A) 12/30/2022 1545   HGBUR NEGATIVE 12/30/2022 1545   BILIRUBINUR NEGATIVE 12/30/2022 1545   BILIRUBINUR negative 04/05/2020 1600   KETONESUR NEGATIVE 12/30/2022 1545   PROTEINUR NEGATIVE 12/30/2022 1545   UROBILINOGEN 0.2 04/05/2020 1600   UROBILINOGEN 0.2 11/28/2013 0156   NITRITE NEGATIVE 12/30/2022 1545   LEUKOCYTESUR NEGATIVE 12/30/2022 1545   Sepsis Labs Recent Labs  Lab 12/30/22 1545 12/31/22 0333 01/01/23 0323 01/02/23 0355  WBC 12.5* 10.8* 7.2 7.5   Microbiology No results found for this or any previous visit (from the past 240 hour(s)).   Patient was seen and examined on the day of discharge and was found to be in stable condition. Time  coordinating discharge: 25 minutes including assessment and coordination of care, as well as examination of the patient.   SIGNED:  Noralee Stain, DO [facility] 01/04/2023, 10:27 AM

## 2023-01-06 ENCOUNTER — Encounter (HOSPITAL_BASED_OUTPATIENT_CLINIC_OR_DEPARTMENT_OTHER): Payer: Self-pay | Admitting: Emergency Medicine

## 2023-01-06 ENCOUNTER — Other Ambulatory Visit: Payer: Self-pay

## 2023-01-06 ENCOUNTER — Emergency Department (HOSPITAL_BASED_OUTPATIENT_CLINIC_OR_DEPARTMENT_OTHER)
Admission: EM | Admit: 2023-01-06 | Discharge: 2023-01-06 | Disposition: A | Discharge status: Home / Self Care | Payer: Medicare HMO | Attending: Emergency Medicine | Admitting: Emergency Medicine

## 2023-01-06 ENCOUNTER — Emergency Department (HOSPITAL_BASED_OUTPATIENT_CLINIC_OR_DEPARTMENT_OTHER): Payer: Medicare HMO

## 2023-01-06 DIAGNOSIS — I808 Phlebitis and thrombophlebitis of other sites: Secondary | ICD-10-CM | POA: Diagnosis not present

## 2023-01-06 DIAGNOSIS — M79632 Pain in left forearm: Secondary | ICD-10-CM | POA: Diagnosis present

## 2023-01-06 LAB — BASIC METABOLIC PANEL
Anion gap: 10 (ref 5–15)
BUN: 16 mg/dL (ref 6–20)
CO2: 23 mmol/L (ref 22–32)
Calcium: 9.3 mg/dL (ref 8.9–10.3)
Chloride: 104 mmol/L (ref 98–111)
Creatinine, Ser: 0.75 mg/dL (ref 0.44–1.00)
GFR, Estimated: >60 mL/min (ref >60)
Glucose, Bld: 111 mg/dL — ABNORMAL HIGH (ref 70–99)
Potassium: 3.4 mmol/L — ABNORMAL LOW (ref 3.5–5.1)
Sodium: 137 mmol/L (ref 135–145)

## 2023-01-06 LAB — CBC WITH DIFFERENTIAL/PLATELET
Abs Immature Granulocytes: 0.05 10*3/uL (ref 0.00–0.07)
Basophils Absolute: 0.1 10*3/uL (ref 0.0–0.1)
Basophils Relative: 1 %
Eosinophils Absolute: 0.2 10*3/uL (ref 0.0–0.5)
Eosinophils Relative: 2 %
HCT: 36.3 % (ref 36.0–46.0)
Hemoglobin: 12.1 g/dL (ref 12.0–15.0)
Immature Granulocytes: 1 %
Lymphocytes Relative: 25 %
Lymphs Abs: 2.5 10*3/uL (ref 0.7–4.0)
MCH: 29.2 pg (ref 26.0–34.0)
MCHC: 33.3 g/dL (ref 30.0–36.0)
MCV: 87.5 fL (ref 80.0–100.0)
Monocytes Absolute: 0.6 10*3/uL (ref 0.1–1.0)
Monocytes Relative: 6 %
Neutro Abs: 6.7 10*3/uL (ref 1.7–7.7)
Neutrophils Relative %: 65 %
Platelets: 370 10*3/uL (ref 150–400)
RBC: 4.15 MIL/uL (ref 3.87–5.11)
RDW: 13.7 % (ref 11.5–15.5)
WBC: 10.1 10*3/uL (ref 4.0–10.5)
nRBC: 0 % (ref 0.0–0.2)

## 2023-01-06 MED ORDER — ASPIRIN 81 MG PO CHEW: 81.0000 mg | CHEWABLE_TABLET | Freq: Every day | ORAL | 0 refills | Status: AC

## 2023-01-06 MED ORDER — ACETAMINOPHEN 325 MG PO TABS: 650.0000 mg | ORAL_TABLET | Freq: Four times a day (QID) | ORAL | 0 refills | Status: DC | PRN

## 2023-01-06 MED ORDER — ACETAMINOPHEN 500 MG PO TABS
1000.0000 mg | ORAL_TABLET | Freq: Once | ORAL | Status: AC
  Administered 2023-01-06: 1000 mg via ORAL
  Filled 2023-01-06: qty 2

## 2023-01-06 MED ORDER — ASPIRIN 81 MG PO CHEW: 81.0000 mg | CHEWABLE_TABLET | Freq: Every day | ORAL | 0 refills | Status: DC

## 2023-01-06 MED ORDER — CEPHALEXIN 500 MG PO CAPS: 500.0000 mg | ORAL_CAPSULE | Freq: Three times a day (TID) | ORAL | 0 refills | Status: AC

## 2023-01-06 MED ORDER — ACETAMINOPHEN 325 MG PO TABS: 650.0000 mg | ORAL_TABLET | Freq: Four times a day (QID) | ORAL | 0 refills | Status: AC | PRN

## 2023-01-06 MED ORDER — CEPHALEXIN 500 MG PO CAPS: 500.0000 mg | ORAL_CAPSULE | Freq: Three times a day (TID) | ORAL | 0 refills | Status: DC

## 2023-01-06 NOTE — ED Provider Notes (Signed)
[place] EMERGENCY DEPARTMENT AT [facility] [place]  Provider Note  CSN: [id number] Arrival date & time: 01/06/23 4098  History Chief Complaint  Patient presents with  . Arm Pain    Casey Jordan is a 57 y.o. female recently admitted for cryptogenic pancreatitis (felt to be due to [facility]). She reports difficult IV access during her admission and infiltration of IVF and possibly medications into her L forearm during the admission. She has since had worsening pain and redness. Has been using warm compress and tramadol for pain.    Home Medications Prior to Admission medications   Medication Sig Start Date End Date Taking? Authorizing Provider  albuterol (VENTOLIN HFA) 108 (90 Base) MCG/ACT inhaler Inhale 2 puffs into the lungs every 6 (six) hours as needed for wheezing or shortness of breath. 03/06/20   Anders Simmonds, PA-C  ALPRAZolam Prudy Feeler) 0.5 MG tablet Take 0.5 mg by mouth as needed for anxiety (flying). 08/31/22   [provider]  Baclofen 5 MG TABS Take 5 mg by mouth 3 (three) times daily as needed (muscle spasms). 01/02/22   [provider]  cyanocobalamin (VITAMIN B12) 1000 MCG/ML injection Inject 1,000 mcg into the muscle every 28 (twenty-eight) days. 11/10/22   [provider]  dicyclomine (BENTYL) 20 MG tablet Take 1 tablet (20 mg total) by mouth 2 (two) times daily. 09/05/21   Palumbo, April, MD  famotidine (PEPCID) 20 MG tablet Take 1 tablet (20 mg total) by mouth 2 (two) times daily. Needs OV for additional refills. 01/28/21   Hoy Register, MD  fluticasone (FLOVENT HFA) 110 MCG/ACT inhaler Inhale 1 puff into the lungs as needed (shortness of breath). 07/28/21   [provider]  gabapentin (NEURONTIN) 300 MG capsule Take 300 mg by mouth 3 (three) times daily as needed (Nerve pain).    [provider]  lamoTRIgine (LAMICTAL) 25 MG tablet Take 25 mg by mouth at bedtime. 01/24/22   [provider]  lidocaine (LIDODERM)  5 % Place 1 patch onto the skin as needed (back pain). 06/28/22   [provider]  LORazepam (ATIVAN) 0.5 MG tablet Take 1 tablet (0.5 mg total) by mouth daily. Patient taking differently: Take 0.5 mg by mouth daily as needed for anxiety. 09/03/21   Shanna Cisco, NP  meclizine (ANTIVERT) 25 MG tablet Take 1 tablet (25 mg total) by mouth 3 (three) times daily as needed for dizziness. 01/28/21   Hoy Register, MD  Multiple Vitamin (MULTIVITAMIN WITH MINERALS) TABS tablet Take 1 tablet by mouth daily.    [provider]  omeprazole (PRILOSEC) 40 MG capsule Take 1 capsule (40 mg total) by mouth 2 (two) times daily. 01/28/21   Hoy Register, MD  promethazine (PHENERGAN) 25 MG tablet Take 1 tablet (25 mg total) by mouth every 6 (six) hours as needed for nausea or vomiting. 01/04/23   Noralee Stain, DO  rosuvastatin (CRESTOR) 20 MG tablet Take 20 mg by mouth daily.    [provider]  sertraline (ZOLOFT) 100 MG tablet Take 100 mg by mouth daily. 09/09/22   [provider]  traMADol (ULTRAM) 50 MG tablet Take 1 tablet (50 mg total) by mouth every 6 (six) hours as needed for up to 5 days for moderate pain. 01/04/23 01/09/23  Noralee Stain, DO  traZODone (DESYREL) 100 MG tablet TAKE 1 TABLET AT BEDTIME Patient taking differently: Take 100 mg by mouth at bedtime as needed for sleep. 04/13/22   Meta Hatchet, PA  Allergies    Codeine, Naproxen, Avocado, Sulfa antibiotics, Meloxicam, Mounjaro [tirzepatide], Sulfamethoxazole, Sulfasalazine, Fluconazole, Nickel, Ondansetron, Penicillin g, Penicillins, and Sulfamethoxazole-trimethoprim   Review of Systems   Review of Systems Please see HPI for pertinent positives and negatives  Physical Exam BP 126/80 (BP Location: Right Arm)   Pulse 82   Temp 98.4 F (36.9 C) (Oral)   Resp 16   Ht 5\' 5"  (1.651 m)   Wt 95 kg   LMP 03/21/2017 (Exact Date)   SpO2 100%   BMI 34.85 kg/m   Physical Exam Vitals and  nursing note reviewed.  HENT:     Head: Normocephalic.     Nose: Nose normal.  Eyes:     Extraocular Movements: Extraocular movements intact.  Pulmonary:     Effort: Pulmonary effort is normal.  Musculoskeletal:        General: Swelling and tenderness (erythema, warmth and induration overlying L forearm IV site. Some residual bruising, no fluctuance, there is a cord in [facility]) present. Normal range of motion.     Cervical back: Neck supple.  Skin:    Findings: No rash (on exposed skin).  Neurological:     Mental Status: She is alert and oriented to person, place, and time.  Psychiatric:        Mood and Affect: Mood normal.     ED Results / Procedures / Treatments   EKG None  Procedures Procedures  Medications Ordered in the ED Medications  acetaminophen (TYLENOL) tablet 1,000 mg (1,000 mg Oral Given 01/06/23 0647)    Initial Impression and Plan  Patient here with suspected thrombophlebitis after recent IV infiltration. Will check basic labs and send for [place] when they are available later this morning. APAP for pain, allergies to NSAIDs. Warm compress for comfort.   ED Course   Clinical Course as of 01/06/23 0703  Wed Jan 06, 2023  1610 CBC is normal.  [CS]  [phone] Care of the patient will be signed out to the oncoming team at the change of shift.  [CS]  0702 BMP is normal.  [CS]    Clinical Course User Index [CS] Pollyann Savoy, MD     MDM Rules/Calculators/A&P Medical Decision Making Problems Addressed: Thrombophlebitis arm: acute illness or injury  Amount and/or Complexity of Data Reviewed Labs: ordered. Decision-making details documented in ED Course. Radiology: ordered.  Risk OTC drugs.     Final Clinical Impression(s) / ED Diagnoses Final diagnoses:  Thrombophlebitis arm    Rx / DC Orders ED Discharge Orders     None        Pollyann Savoy, MD 01/06/23 [id number]

## 2023-01-06 NOTE — Discharge Instructions (Addendum)
It was a pleasure caring for you today in the emergency department.  Please follow-up with your PCP in the next 5 to 7 days for recheck the inflammation in your arm.  You may require repeat ultrasound if symptoms do not improve.  Please elevate your left arm, use light compression, take Tylenol or your previously prescribed Tramadol as needed for pain.  Please return to the emergency department for any worsening or worrisome symptoms.

## 2023-01-06 NOTE — ED Provider Notes (Signed)
  Provider Note MRN:  [id number]  Arrival date & time: 01/06/23    ED Course and Medical Decision Making  Assumed care from Dr Bernette Mayers at shift change.  See note from prior team for complete details, in brief:  57 yo female Admitted last week ?thrombophlebitis of arm from IV site [place] pending   Plan per prior physician f/u ultrasound  Ultrasound shows no DVT, is positive for superficial thrombophlebitis of the left basilic vein Recommend NSAID's but allergic to naproxen, reports she can take asa though w/o issue Compression/elevation/recheck in 7 days with pcp, if not improved would recommend rpt [place] Localized erythema and warmth, will give abx as well for pos localized infection  The patient improved significantly and was discharged in stable condition. Detailed discussions were had with the patient regarding current findings, and need for close f/u with PCP or on call doctor. The patient has been instructed to return immediately if the symptoms worsen in any way for re-evaluation. Patient verbalized understanding and is in agreement with current care plan. All questions answered prior to discharge.    Procedures  Final Clinical Impressions(s) / ED Diagnoses     ICD-10-CM   1. Thrombophlebitis arm  I80.8       ED Discharge Orders     None       Discharge Instructions   None        Sloan Leiter, DO 01/06/23 4098

## 2023-01-06 NOTE — ED Triage Notes (Signed)
Patient arrived via POV c/o lower left arm pain post hospital admission x 4 days. Patient states while admitted, she had an IV infiltrate, causing swelling to her lower left forearm. Patient state it has gotten worse since discharge on Monday. Patient states 6/10 pain. Patient is AO x 4, VS WDL, normal gait.

## 2023-03-08 ENCOUNTER — Encounter (HOSPITAL_COMMUNITY): Payer: Self-pay | Admitting: Psychiatry

## 2023-03-08 ENCOUNTER — Ambulatory Visit (HOSPITAL_COMMUNITY): Payer: Medicare HMO | Admitting: Psychiatry

## 2023-03-08 DIAGNOSIS — F3181 Bipolar II disorder: Secondary | ICD-10-CM

## 2023-03-08 DIAGNOSIS — F1091 Alcohol use, unspecified, in remission: Secondary | ICD-10-CM

## 2023-03-08 DIAGNOSIS — F410 Panic disorder [episodic paroxysmal anxiety] without agoraphobia: Secondary | ICD-10-CM

## 2023-03-08 DIAGNOSIS — F411 Generalized anxiety disorder: Secondary | ICD-10-CM

## 2023-03-08 DIAGNOSIS — F4310 Post-traumatic stress disorder, unspecified: Secondary | ICD-10-CM | POA: Diagnosis not present

## 2023-03-08 MED ORDER — SERTRALINE HCL 100 MG PO TABS: 100.0000 mg | ORAL_TABLET | Freq: Every day | ORAL | 0 refills | Status: DC

## 2023-03-08 MED ORDER — LAMOTRIGINE 25 MG PO TABS
25.0000 mg | ORAL_TABLET | Freq: Every day | ORAL | 0 refills | Status: DC
Start: 2023-03-08 — End: 2023-03-29
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
## 2023-03-23 ENCOUNTER — Telehealth (HOSPITAL_COMMUNITY): Payer: Self-pay

## 2023-03-23 MED ORDER — SERTRALINE HCL 100 MG PO TABS: 100.0000 mg | ORAL_TABLET | Freq: Every day | ORAL | 0 refills | Status: DC

## 2023-03-23 NOTE — Telephone Encounter (Signed)
Medication refill - Fax from pt's [facility] in [place] for a new 90 day supply of pt's prescribed Sertaline 100 mg, last provided for 30 days and no refills 03/08/23 and pt returns next on 03/29/23.

## 2023-03-29 ENCOUNTER — Encounter (HOSPITAL_COMMUNITY): Payer: Self-pay | Admitting: Psychiatry

## 2023-03-29 ENCOUNTER — Telehealth (INDEPENDENT_AMBULATORY_CARE_PROVIDER_SITE_OTHER): Payer: Medicare HMO | Admitting: Psychiatry

## 2023-03-29 DIAGNOSIS — F411 Generalized anxiety disorder: Secondary | ICD-10-CM | POA: Diagnosis not present

## 2023-03-29 DIAGNOSIS — F1091 Alcohol use, unspecified, in remission: Secondary | ICD-10-CM

## 2023-03-29 DIAGNOSIS — F4310 Post-traumatic stress disorder, unspecified: Secondary | ICD-10-CM

## 2023-03-29 DIAGNOSIS — G4700 Insomnia, unspecified: Secondary | ICD-10-CM | POA: Diagnosis not present

## 2023-03-29 DIAGNOSIS — F3181 Bipolar II disorder: Secondary | ICD-10-CM

## 2023-03-29 DIAGNOSIS — F410 Panic disorder [episodic paroxysmal anxiety] without agoraphobia: Secondary | ICD-10-CM

## 2023-03-29 MED ORDER — TRAZODONE HCL 50 MG PO TABS
50.0000 mg | ORAL_TABLET | Freq: Every day | ORAL | 0 refills | Status: DC
Start: 2023-03-29 — End: 2023-04-30

## 2023-03-29 MED ORDER — LAMOTRIGINE 25 MG PO TABS: 25.0000 mg | ORAL_TABLET | Freq: Every day | ORAL | 0 refills | Status: DC
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
## 2023-04-30 ENCOUNTER — Other Ambulatory Visit (HOSPITAL_COMMUNITY): Payer: Self-pay | Admitting: Psychiatry

## 2023-05-24 ENCOUNTER — Other Ambulatory Visit (HOSPITAL_COMMUNITY): Payer: Self-pay | Admitting: Psychiatry

## 2023-05-24 ENCOUNTER — Telehealth (INDEPENDENT_AMBULATORY_CARE_PROVIDER_SITE_OTHER): Payer: Medicare HMO | Admitting: Psychiatry

## 2023-05-24 ENCOUNTER — Encounter (HOSPITAL_COMMUNITY): Payer: Self-pay | Admitting: Psychiatry

## 2023-05-24 DIAGNOSIS — G4700 Insomnia, unspecified: Secondary | ICD-10-CM

## 2023-05-24 DIAGNOSIS — F410 Panic disorder [episodic paroxysmal anxiety] without agoraphobia: Secondary | ICD-10-CM | POA: Diagnosis not present

## 2023-05-24 DIAGNOSIS — F4310 Post-traumatic stress disorder, unspecified: Secondary | ICD-10-CM

## 2023-05-24 DIAGNOSIS — F1090 Alcohol use, unspecified, uncomplicated: Secondary | ICD-10-CM

## 2023-05-24 DIAGNOSIS — F3181 Bipolar II disorder: Secondary | ICD-10-CM | POA: Diagnosis not present

## 2023-05-24 DIAGNOSIS — F411 Generalized anxiety disorder: Secondary | ICD-10-CM

## 2023-05-24 MED ORDER — SERTRALINE HCL 25 MG PO TABS: 25.0000 mg | ORAL_TABLET | Freq: Every day | ORAL | 0 refills | Status: DC
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
## 2023-06-02 ENCOUNTER — Ambulatory Visit (HOSPITAL_COMMUNITY): Payer: Medicare HMO | Admitting: Licensed Clinical Social Worker

## 2023-06-02 DIAGNOSIS — F4310 Post-traumatic stress disorder, unspecified: Secondary | ICD-10-CM

## 2023-06-02 DIAGNOSIS — F411 Generalized anxiety disorder: Secondary | ICD-10-CM | POA: Diagnosis not present

## 2023-06-02 DIAGNOSIS — F332 Major depressive disorder, recurrent severe without psychotic features: Secondary | ICD-10-CM | POA: Diagnosis not present

## 2023-06-02 DIAGNOSIS — F4321 Adjustment disorder with depressed mood: Secondary | ICD-10-CM | POA: Diagnosis not present
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
## 2023-06-09 ENCOUNTER — Encounter (HOSPITAL_COMMUNITY): Payer: Self-pay

## 2023-06-09 ENCOUNTER — Ambulatory Visit (INDEPENDENT_AMBULATORY_CARE_PROVIDER_SITE_OTHER): Payer: Medicare HMO | Admitting: Licensed Clinical Social Worker

## 2023-06-09 DIAGNOSIS — F4310 Post-traumatic stress disorder, unspecified: Secondary | ICD-10-CM | POA: Diagnosis not present

## 2023-06-09 DIAGNOSIS — F411 Generalized anxiety disorder: Secondary | ICD-10-CM | POA: Diagnosis not present

## 2023-06-09 DIAGNOSIS — F4321 Adjustment disorder with depressed mood: Secondary | ICD-10-CM

## 2023-06-09 DIAGNOSIS — F332 Major depressive disorder, recurrent severe without psychotic features: Secondary | ICD-10-CM | POA: Diagnosis not present
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
## 2023-06-19 ENCOUNTER — Other Ambulatory Visit (HOSPITAL_COMMUNITY): Payer: Self-pay | Admitting: Psychiatry

## 2023-06-20 ENCOUNTER — Other Ambulatory Visit (HOSPITAL_COMMUNITY): Payer: Self-pay | Admitting: Psychiatry

## 2023-06-21 ENCOUNTER — Telehealth (INDEPENDENT_AMBULATORY_CARE_PROVIDER_SITE_OTHER): Payer: Medicare HMO | Admitting: Psychiatry

## 2023-06-21 ENCOUNTER — Encounter (HOSPITAL_COMMUNITY): Payer: Self-pay | Admitting: Psychiatry

## 2023-06-21 DIAGNOSIS — F4310 Post-traumatic stress disorder, unspecified: Secondary | ICD-10-CM | POA: Diagnosis not present

## 2023-06-21 DIAGNOSIS — F411 Generalized anxiety disorder: Secondary | ICD-10-CM

## 2023-06-21 DIAGNOSIS — F4321 Adjustment disorder with depressed mood: Secondary | ICD-10-CM

## 2023-06-21 DIAGNOSIS — F3181 Bipolar II disorder: Secondary | ICD-10-CM | POA: Diagnosis not present

## 2023-06-21 DIAGNOSIS — F410 Panic disorder [episodic paroxysmal anxiety] without agoraphobia: Secondary | ICD-10-CM

## 2023-06-21 DIAGNOSIS — G4700 Insomnia, unspecified: Secondary | ICD-10-CM

## 2023-06-21 DIAGNOSIS — F1090 Alcohol use, unspecified, uncomplicated: Secondary | ICD-10-CM

## 2023-06-21 MED ORDER — LAMOTRIGINE 25 MG PO TABS: 25.0000 mg | ORAL_TABLET | Freq: Every day | ORAL | 0 refills | Status: DC
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
## 2023-06-23 ENCOUNTER — Ambulatory Visit (HOSPITAL_COMMUNITY): Payer: Medicare HMO | Admitting: Licensed Clinical Social Worker

## 2023-06-23 ENCOUNTER — Encounter (HOSPITAL_COMMUNITY): Payer: Self-pay

## 2023-06-23 DIAGNOSIS — F3181 Bipolar II disorder: Secondary | ICD-10-CM

## 2023-06-23 DIAGNOSIS — F331 Major depressive disorder, recurrent, moderate: Secondary | ICD-10-CM | POA: Diagnosis not present

## 2023-06-23 DIAGNOSIS — F411 Generalized anxiety disorder: Secondary | ICD-10-CM

## 2023-06-23 DIAGNOSIS — F4310 Post-traumatic stress disorder, unspecified: Secondary | ICD-10-CM | POA: Diagnosis not present

## 2023-06-23 DIAGNOSIS — F4321 Adjustment disorder with depressed mood: Secondary | ICD-10-CM
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
## 2023-07-05 ENCOUNTER — Other Ambulatory Visit (HOSPITAL_COMMUNITY): Payer: Self-pay | Admitting: Psychiatry

## 2023-07-19 ENCOUNTER — Ambulatory Visit (INDEPENDENT_AMBULATORY_CARE_PROVIDER_SITE_OTHER): Payer: Medicare HMO | Admitting: Licensed Clinical Social Worker

## 2023-07-19 DIAGNOSIS — F4310 Post-traumatic stress disorder, unspecified: Secondary | ICD-10-CM

## 2023-07-19 DIAGNOSIS — F332 Major depressive disorder, recurrent severe without psychotic features: Secondary | ICD-10-CM | POA: Diagnosis not present

## 2023-07-19 DIAGNOSIS — F411 Generalized anxiety disorder: Secondary | ICD-10-CM | POA: Diagnosis not present

## 2023-07-19 DIAGNOSIS — F4321 Adjustment disorder with depressed mood: Secondary | ICD-10-CM | POA: Diagnosis not present
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
## 2023-07-22 ENCOUNTER — Ambulatory Visit (HOSPITAL_COMMUNITY): Payer: Medicare HMO | Admitting: Licensed Clinical Social Worker

## 2023-07-29 ENCOUNTER — Encounter (HOSPITAL_COMMUNITY): Payer: Self-pay

## 2023-07-29 ENCOUNTER — Ambulatory Visit (INDEPENDENT_AMBULATORY_CARE_PROVIDER_SITE_OTHER): Payer: Self-pay | Admitting: Licensed Clinical Social Worker

## 2023-07-29 DIAGNOSIS — Z91199 Patient's noncompliance with other medical treatment and regimen due to unspecified reason: Secondary | ICD-10-CM

## 2023-07-29 NOTE — Progress Notes (Signed)
 Contacted patient through text and she did not respond. Session is a no show

## 2023-08-04 ENCOUNTER — Ambulatory Visit (HOSPITAL_COMMUNITY): Payer: Medicare HMO | Admitting: Licensed Clinical Social Worker

## 2023-08-11 ENCOUNTER — Ambulatory Visit (HOSPITAL_COMMUNITY): Payer: Medicare HMO | Admitting: Licensed Clinical Social Worker

## 2023-08-18 ENCOUNTER — Ambulatory Visit (HOSPITAL_COMMUNITY): Payer: Medicare HMO | Admitting: Licensed Clinical Social Worker

## 2023-08-23 ENCOUNTER — Telehealth (HOSPITAL_COMMUNITY): Payer: Medicare HMO | Admitting: Psychiatry

## 2023-09-02 ENCOUNTER — Telehealth (HOSPITAL_COMMUNITY): Payer: Self-pay | Admitting: *Deleted

## 2023-09-02 NOTE — Telephone Encounter (Signed)
Verifying Rx

## 2023-09-07 ENCOUNTER — Telehealth (HOSPITAL_COMMUNITY): Payer: Self-pay | Admitting: *Deleted

## 2023-09-07 MED ORDER — TRAZODONE HCL 50 MG PO TABS: 50.0000 mg | ORAL_TABLET | Freq: Every day | ORAL | 0 refills | Status: AC

## 2023-09-07 NOTE — Telephone Encounter (Signed)
Rx REFILL  [id number] - [place], [place] - [address]   traZODone (DESYREL) 50 MG tablet   NEXT  APPT ?? Not Scheduled LAST APPT  07/09/23

## 2023-09-07 NOTE — Addendum Note (Signed)
Addended by: Thresa Ross on: 09/07/2023 03:21 PM   Modules accepted: Orders

## 2023-09-13 ENCOUNTER — Encounter (HOSPITAL_COMMUNITY): Payer: Self-pay | Admitting: Psychiatry

## 2023-09-13 ENCOUNTER — Telehealth (INDEPENDENT_AMBULATORY_CARE_PROVIDER_SITE_OTHER): Payer: Medicare PPO | Admitting: Psychiatry

## 2023-09-13 DIAGNOSIS — F4310 Post-traumatic stress disorder, unspecified: Secondary | ICD-10-CM

## 2023-09-13 DIAGNOSIS — F4321 Adjustment disorder with depressed mood: Secondary | ICD-10-CM

## 2023-09-13 DIAGNOSIS — F411 Generalized anxiety disorder: Secondary | ICD-10-CM | POA: Diagnosis not present

## 2023-09-13 MED ORDER — LAMOTRIGINE 25 MG PO TABS: 50.0000 mg | ORAL_TABLET | Freq: Every day | ORAL | 0 refills | Status: DC

## 2023-09-13 MED ORDER — SERTRALINE HCL 100 MG PO TABS: 100.0000 mg | ORAL_TABLET | Freq: Every day | ORAL | 0 refills | Status: AC

## 2023-09-13 MED ORDER — SERTRALINE HCL 25 MG PO TABS: 25.0000 mg | ORAL_TABLET | Freq: Every day | ORAL | 0 refills | Status: DC
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
## 2023-10-15 ENCOUNTER — Other Ambulatory Visit (HOSPITAL_COMMUNITY): Payer: Self-pay | Admitting: Psychiatry

## 2023-10-29 ENCOUNTER — Emergency Department (HOSPITAL_BASED_OUTPATIENT_CLINIC_OR_DEPARTMENT_OTHER)
Admission: EM | Admit: 2023-10-29 | Discharge: 2023-10-29 | Disposition: A | Discharge status: Home / Self Care | Attending: Emergency Medicine | Admitting: Emergency Medicine

## 2023-10-29 ENCOUNTER — Telehealth: Payer: Self-pay | Admitting: Gastroenterology

## 2023-10-29 ENCOUNTER — Encounter (HOSPITAL_BASED_OUTPATIENT_CLINIC_OR_DEPARTMENT_OTHER): Payer: Self-pay

## 2023-10-29 ENCOUNTER — Emergency Department (HOSPITAL_BASED_OUTPATIENT_CLINIC_OR_DEPARTMENT_OTHER)

## 2023-10-29 ENCOUNTER — Other Ambulatory Visit: Payer: Self-pay

## 2023-10-29 DIAGNOSIS — J45909 Unspecified asthma, uncomplicated: Secondary | ICD-10-CM | POA: Diagnosis not present

## 2023-10-29 DIAGNOSIS — R1013 Epigastric pain: Secondary | ICD-10-CM | POA: Diagnosis not present

## 2023-10-29 DIAGNOSIS — Z79899 Other long term (current) drug therapy: Secondary | ICD-10-CM | POA: Diagnosis not present

## 2023-10-29 DIAGNOSIS — Z7951 Long term (current) use of inhaled steroids: Secondary | ICD-10-CM | POA: Diagnosis not present

## 2023-10-29 DIAGNOSIS — D72829 Elevated white blood cell count, unspecified: Secondary | ICD-10-CM | POA: Diagnosis not present

## 2023-10-29 DIAGNOSIS — R110 Nausea: Secondary | ICD-10-CM | POA: Diagnosis not present

## 2023-10-29 DIAGNOSIS — R1010 Upper abdominal pain, unspecified: Secondary | ICD-10-CM | POA: Diagnosis present

## 2023-10-29 LAB — COMPREHENSIVE METABOLIC PANEL WITH GFR
ALT: 44 U/L (ref 0–44)
AST: 40 U/L (ref 15–41)
Albumin: 4.2 g/dL (ref 3.5–5.0)
Alkaline Phosphatase: 103 U/L (ref 38–126)
Anion gap: 12 (ref 5–15)
BUN: 16 mg/dL (ref 6–20)
CO2: 23 mmol/L (ref 22–32)
Calcium: 9.8 mg/dL (ref 8.9–10.3)
Chloride: 103 mmol/L (ref 98–111)
Creatinine, Ser: 0.74 mg/dL (ref 0.44–1.00)
GFR, Estimated: >60 mL/min (ref >60)
Glucose, Bld: 166 mg/dL — ABNORMAL HIGH (ref 70–99)
Potassium: 3.8 mmol/L (ref 3.5–5.1)
Sodium: 138 mmol/L (ref 135–145)
Total Bilirubin: 0.6 mg/dL (ref 0.0–1.2)
Total Protein: 7.8 g/dL (ref 6.5–8.1)

## 2023-10-29 LAB — URINALYSIS, ROUTINE W REFLEX MICROSCOPIC
Bilirubin Urine: NEGATIVE
Glucose, UA: NEGATIVE mg/dL
Hgb urine dipstick: NEGATIVE
Ketones, ur: NEGATIVE mg/dL
Leukocytes,Ua: NEGATIVE
Nitrite: NEGATIVE
Protein, ur: 30 mg/dL — AB
Specific Gravity, Urine: >=1.03 (ref 1.005–1.030)
pH: 5.5 (ref 5.0–8.0)

## 2023-10-29 LAB — CBC
HCT: 41.6 % (ref 36.0–46.0)
Hemoglobin: 13.8 g/dL (ref 12.0–15.0)
MCH: 29.4 pg (ref 26.0–34.0)
MCHC: 33.2 g/dL (ref 30.0–36.0)
MCV: 88.5 fL (ref 80.0–100.0)
Platelets: 431 10*3/uL — ABNORMAL HIGH (ref 150–400)
RBC: 4.7 MIL/uL (ref 3.87–5.11)
RDW: 13.3 % (ref 11.5–15.5)
WBC: 10.7 10*3/uL — ABNORMAL HIGH (ref 4.0–10.5)
nRBC: 0 % (ref 0.0–0.2)

## 2023-10-29 LAB — URINALYSIS, MICROSCOPIC (REFLEX)

## 2023-10-29 LAB — TROPONIN I (HIGH SENSITIVITY): Troponin I (High Sensitivity): 2 ng/L (ref <18)

## 2023-10-29 LAB — LIPASE, BLOOD: Lipase: 41 U/L (ref 11–51)

## 2023-10-29 MED ORDER — SODIUM CHLORIDE 0.9 % IV SOLN
12.5000 mg | Freq: Once | INTRAVENOUS | Status: AC
  Administered 2023-10-29: 12.5 mg via INTRAVENOUS
  Filled 2023-10-29: qty 0.5

## 2023-10-29 MED ORDER — LACTATED RINGERS IV SOLN: INTRAVENOUS | Status: DC

## 2023-10-29 MED ORDER — ALUM & MAG HYDROXIDE-SIMETH 200-200-20 MG/5ML PO SUSP
30.0000 mL | Freq: Once | ORAL | Status: AC
  Administered 2023-10-29: 30 mL via ORAL
  Filled 2023-10-29: qty 30

## 2023-10-29 MED ORDER — HYDROMORPHONE HCL 1 MG/ML IJ SOLN
1.0000 mg | Freq: Once | INTRAMUSCULAR | Status: AC
  Administered 2023-10-29: 1 mg via INTRAVENOUS
  Filled 2023-10-29: qty 1

## 2023-10-29 MED ORDER — PROMETHAZINE HCL 25 MG/ML IJ SOLN
INTRAMUSCULAR | Status: AC
  Filled 2023-10-29: qty 1

## 2023-10-29 MED ORDER — IOHEXOL 300 MG/ML  SOLN
100.0000 mL | Freq: Once | INTRAMUSCULAR | Status: AC | PRN
  Administered 2023-10-29: 100 mL via INTRAVENOUS

## 2023-10-29 MED ORDER — SODIUM CHLORIDE 0.9 % IV BOLUS
1000.0000 mL | Freq: Once | INTRAVENOUS | Status: AC
  Administered 2023-10-29: 1000 mL via INTRAVENOUS

## 2023-10-29 NOTE — ED Notes (Signed)
 To CT

## 2023-10-29 NOTE — ED Provider Notes (Signed)
 Epigastric pain with prior hx of pancreatitis last year.  Nausea without vomiting.  No fever, diarrhea or recent surgery.  Labs reassuring other than minimal leukocytosis of 11.  Imaging pending and pt given meds. I have independently visualized and interpreted pt's images today.  CT abd/pelvis without acute findings.  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  Findings discussed with the patient.  She is still having some epigastric pain but on further questioning this has been ongoing for quite some time and waxes and wanes.  She does have a GI provider and has had endoscopies that have shown polyps but no specific cause for her pain.  She does report a lot of diarrhea after eating and pain can be exacerbated after meals but she cannot identify any specific foods.  Discussed with her possible illumination diet to see if she can find if there are certain types of foods that cause her issue.  Also wonder if patient may have pancreatic insufficiency, ulcers, gastroparesis.  She already is on famotidine and omeprazole.  She was given a GI cocktail here however does seem to be improved from when she arrived.  Feel that she will need more extensive workup as an outpatient.  At this time patient does not require admission and is stable for discharge.   Gwyneth Sprout, MD 10/29/23 [phone]

## 2023-10-29 NOTE — Discharge Instructions (Addendum)
 No signs of pancreatitis today and no new findings on CAT scan.  Labs were good.  However you may want to ask your GI specialist about pancreatic insufficiency, gastroparesis or hiatal hernia being the cause of your symptoms.  You also may wanna try an elimination diet to see if you can target what kind of foods may trigger symptoms.

## 2023-10-29 NOTE — Telephone Encounter (Signed)
 Patient called and stated that she went to ED for epicgastic pain and was wanting to transfer her care of to [place] anyway. Patient was advised that we would need her colonoscopy and endoscopy records before scheduling an appointment. Patient stated that she will have those records faxed over to [place]. Please advise.

## 2023-10-29 NOTE — ED Provider Notes (Signed)
 [facility] Provider Note   CSN: [id number] Arrival date & time: 10/29/23  4696     History  Chief Complaint  Patient presents with   Abdominal Pain    Casey Jordan is a 58 y.o. female.  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  Pain feels similar to previous episodes of pancreatitis.  Pain is in her epigastrium and is constant and progressively worsening.  Does not radiate.  Associate with nausea but no vomiting.  She has been attempting to take care of this at home with heating pad and clear liquid diet without relief.  Has not had any vomiting.  No fever.  Contrary to triage note denies diarrhea.  No constipation.  No black or bloody stools.  No chest pain or shortness of breath. Previous cholecystectomy.  Does not drink alcohol.  Does have a history of GERD as well as well as previous stomach polyps that have been surgically removed. Her pancreatitis was thought to be secondary to [facility] which she has since stopped.  The history is provided by the patient.  Abdominal Pain Associated symptoms: nausea   Associated symptoms: no chest pain, no cough, no dysuria, no fever, no hematuria, no shortness of breath and no vomiting        Home Medications Prior to Admission medications   Medication Sig Start Date End Date Taking? Authorizing Provider  acetaminophen (TYLENOL) 325 MG tablet Take 2 tablets (650 mg total) by mouth every 6 (six) hours as needed. 01/06/23   Sloan Leiter, DO  albuterol (VENTOLIN HFA) 108 (90 Base) MCG/ACT inhaler Inhale 2 puffs into the lungs every 6 (six) hours as needed for wheezing or shortness of breath. 03/06/20   Anders Simmonds, PA-C  ALPRAZolam Prudy Feeler) 0.5 MG tablet Take 0.5 mg by mouth as needed for anxiety (flying). 08/31/22   [provider]  Baclofen 5 MG TABS Take 5 mg by mouth 3 (three) times daily as needed (muscle spasms). 01/02/22   [provider]  cyanocobalamin (VITAMIN B12) 1000 MCG/ML injection Inject 1,000 mcg into the muscle every 28 (twenty-eight) days. 11/10/22   [provider]  dicyclomine (BENTYL) 20 MG tablet Take 1 tablet (20 mg total) by mouth 2 (two) times daily. 09/05/21   Palumbo, April, MD  famotidine (PEPCID) 20 MG tablet Take 1 tablet (20 mg total) by mouth 2 (two) times daily. Needs OV for additional refills. 01/28/21   Hoy Register, MD  fluticasone (FLOVENT HFA) 110 MCG/ACT inhaler Inhale 1 puff into the lungs as needed (shortness of breath). 07/28/21   [provider]  gabapentin (NEURONTIN) 300 MG capsule Take 300 mg by mouth 3 (three) times daily as needed (Nerve pain).    [provider]  lamoTRIgine (LAMICTAL) 25 MG tablet TAKE 2 TABLETS BY MOUTH EVERY DAY 10/15/23   Thresa Ross, MD  lidocaine (LIDODERM) 5 % Place 1 patch onto the skin as needed (back pain). 06/28/22   [provider]  LORazepam (ATIVAN) 0.5 MG tablet Take 1 tablet (0.5 mg total) by mouth daily. Patient taking differently: Take 0.5 mg by mouth daily as needed for anxiety. 09/03/21   Shanna Cisco, NP  meclizine (ANTIVERT) 25 MG tablet Take 1 tablet (25 mg total) by mouth 3 (three) times daily as needed for dizziness. 01/28/21  Hoy Register, MD  Multiple Vitamin (MULTIVITAMIN WITH MINERALS) TABS tablet Take 1 tablet by mouth daily.    [provider]  omeprazole (PRILOSEC) 40 MG capsule Take 1 capsule (40 mg total) by mouth 2 (two) times daily. 01/28/21   Hoy Register, MD  promethazine (PHENERGAN) 25 MG tablet Take 1 tablet (25 mg total) by mouth every 6 (six) hours as needed for nausea or vomiting. 01/04/23   Noralee Stain, DO  rosuvastatin (CRESTOR) 20 MG tablet Take 20 mg by mouth daily.    [provider]  sertraline (ZOLOFT) 100 MG tablet Take 1  tablet (100 mg total) by mouth daily. 09/13/23   Thresa Ross, MD  sertraline (ZOLOFT) 25 MG tablet Take 1 tablet (25 mg total) by mouth daily. 09/13/23 09/12/24  Thresa Ross, MD  traZODone (DESYREL) 50 MG tablet Take 1 tablet (50 mg total) by mouth at bedtime. 09/07/23   Thresa Ross, MD      Allergies    Codeine, Naproxen, Avocado, Sulfa antibiotics, Meloxicam, Mounjaro [tirzepatide], Sulfamethoxazole, Sulfasalazine, Fluconazole, Nickel, Ondansetron, Penicillin g, Penicillins, and Sulfamethoxazole-trimethoprim    Review of Systems   Review of Systems  Constitutional:  Positive for activity change and appetite change. Negative for fever.  HENT:  Negative for congestion and rhinorrhea.   Respiratory:  Negative for cough, chest tightness and shortness of breath.   Cardiovascular:  Negative for chest pain.  Gastrointestinal:  Positive for abdominal pain and nausea. Negative for vomiting.  Genitourinary:  Negative for dysuria and hematuria.  Musculoskeletal:  Negative for arthralgias and myalgias.  Skin:  Negative for rash.  Neurological:  Negative for dizziness, weakness and headaches.   all other systems are negative except as noted in the HPI and PMH.    Physical Exam Updated Vital Signs BP (!) 146/75   Pulse 92   Temp 97.6 F (36.4 C) (Oral)   Resp 15   Ht 5\' 6"  (1.676 m)   Wt 104.3 kg   LMP 03/21/2017 (Exact Date)   SpO2 100%   BMI 37.12 kg/m  Physical Exam Vitals and nursing note reviewed.  Constitutional:      General: She is not in acute distress.    Appearance: She is well-developed.  HENT:     Head: Normocephalic and atraumatic.     Mouth/Throat:     Pharynx: No oropharyngeal exudate.  Eyes:     Conjunctiva/sclera: Conjunctivae normal.     Pupils: Pupils are equal, round, and reactive to light.  Neck:     Comments: No meningismus. Cardiovascular:     Rate and Rhythm: Normal rate and regular rhythm.     Heart sounds: Normal heart sounds. No murmur  heard. Pulmonary:     Effort: Pulmonary effort is normal. No respiratory distress.     Breath sounds: Normal breath sounds.  Abdominal:     Palpations: Abdomen is soft.     Tenderness: There is abdominal tenderness. There is no guarding or rebound.     Comments: Epigastric tenderness with voluntary guarding.  No rebound.  Musculoskeletal:        General: No tenderness. Normal range of motion.     Cervical back: Normal range of motion and neck supple.     Comments: No CVAT  Skin:    General: Skin is warm.  Neurological:     Mental Status: She is alert and oriented to person, place, and time.     Cranial Nerves: No cranial nerve deficit.     Motor: No  abnormal muscle tone.     Coordination: Coordination normal.     Comments:  5/5 strength throughout. CN 2-12 intact.Equal grip strength.   Psychiatric:        Behavior: Behavior normal.     ED Results / Procedures / Treatments   Labs (all labs ordered are listed, but only abnormal results are displayed) Labs Reviewed  COMPREHENSIVE METABOLIC PANEL WITH GFR - Abnormal; Notable for the following components:      Result Value   Glucose, Bld 166 (*)    All other components within normal limits  CBC - Abnormal; Notable for the following components:   WBC 10.7 (*)    Platelets 431 (*)    All other components within normal limits  URINALYSIS, ROUTINE W REFLEX MICROSCOPIC - Abnormal; Notable for the following components:   Protein, ur 30 (*)    All other components within normal limits  URINALYSIS, MICROSCOPIC (REFLEX) - Abnormal; Notable for the following components:   Bacteria, UA MANY (*)    All other components within normal limits  URINE CULTURE  LIPASE, BLOOD  TROPONIN I (HIGH SENSITIVITY)    EKG EKG Interpretation Date/Time:  Friday October 29 2023 05:57:18 EDT Ventricular Rate:  89 PR Interval:  148 QRS Duration:  94 QT Interval:  383 QTC Calculation: 466 R Axis:   4  Text Interpretation: Sinus rhythm Low voltage,  precordial leads Abnormal R-wave progression, early transition Borderline T abnormalities, diffuse leads Baseline wander in lead(s) I V1 V5 No significant change was found Interpretation limited secondary to artifact Confirmed by Glynn Octave [phone]) on 10/29/2023 6:00:37 AM  Radiology No results found.  Procedures Procedures    Medications Ordered in ED Medications - No data to display  ED Course/ Medical Decision Making/ A&P                                 Medical Decision Making Amount and/or Complexity of Data Reviewed Labs: ordered. Decision-making details documented in ED Course. Radiology: ordered and independent interpretation performed. Decision-making details documented in ED Course. ECG/medicine tests: ordered and independent interpretation performed. Decision-making details documented in ED Course.  Risk Prescription drug management.   Upper abdominal pain with nausea similar to previous episodes of pancreatitis.  Stable vitals.  Abdomen soft with epigastric tenderness with voluntary guarding.  No rebound.  EKG shows no acute ischemia.  Will hydrate, treat pain and nausea.  Obtain labs.  Patient given pain and nausea medication.  Labs show mild leukocytosis of 10.7 LFTs are normal.  Lipase is normal at 41. Urinalysis shows bacteriuria without UTI symptoms.  Will send culture.  CT scan to be obtained for further evaluation of epigastric abdominal pain.  Care transferred at shift change pending imaging and reassessment.        Final Clinical Impression(s) / ED Diagnoses Final diagnoses:  None    Rx / DC Orders ED Discharge Orders     None         Kaysa Roulhac, Jeannett Senior, MD 10/29/23 [phone]

## 2023-10-29 NOTE — ED Triage Notes (Signed)
 Patient reports abdominal pain with some nausea, no vomiting. Also reports diarrhea. Last meal was at 2030 last night. Patient think this could be related to pancreatitis, She has been treated for it in the past and it feels the same. Denies bloody stool. Rates pain 9/10.

## 2023-10-30 LAB — URINE CULTURE

## 2023-11-08 ENCOUNTER — Telehealth (INDEPENDENT_AMBULATORY_CARE_PROVIDER_SITE_OTHER): Payer: Medicare PPO | Admitting: Psychiatry

## 2023-11-08 ENCOUNTER — Encounter (HOSPITAL_COMMUNITY): Payer: Self-pay | Admitting: Psychiatry

## 2023-11-08 DIAGNOSIS — F411 Generalized anxiety disorder: Secondary | ICD-10-CM

## 2023-11-08 DIAGNOSIS — F3181 Bipolar II disorder: Secondary | ICD-10-CM

## 2023-11-08 DIAGNOSIS — F4310 Post-traumatic stress disorder, unspecified: Secondary | ICD-10-CM | POA: Diagnosis not present
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
## 2023-12-19 ENCOUNTER — Other Ambulatory Visit (HOSPITAL_COMMUNITY): Payer: Self-pay | Admitting: Psychiatry

## 2024-02-09 ENCOUNTER — Telehealth (HOSPITAL_COMMUNITY): Admitting: Psychiatry

## 2024-04-11 NOTE — Progress Notes (Signed)
 Internal Medicine at [facility]   ASSESSMENT/PLAN:  1. Encounter for Medicare annual wellness exam/Annual physical exam (Primary): -Annual medicare wellness visit; see flow-sheet below.  Plan: Patient expresses understanding of their current medications and use.  If a new prescription was given today, then I discussed potential side effects, drug interactions, instructions for taking the medication, and the consequences of not taking it. Patient verbalized an understanding of these instructions. Patient is able to verbalize understanding of the care plan discussed today. Patient's medical and personal goals were discussed today. Barriers to current goals:  None Follow up as discussed in 1 years for: cpe Call sooner if needed.  Chief Complaint  Patient presents with  . Annual Exam  . Medicare Annual Wellness Visit Subsequent    SUBJECTIVE:  Casey Jordan is a 58 y.o. female that presents to clinic today regarding the following issues: cpe  Casey Jordan presents for an annual exam. 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 She has met w/ GI for evaluation of GERD, abnormal liver enzymes and abdominal pain. She has seen neurology/sleep medicine and been diagnosed w/ OSA. Her hep c screen is negative. Her cervical cancer screening is UTD; f/u due 2029. Her mammogram is UTD; f/u due 12/2024. Her colon cancer screening is UTD; f/u due 2027. She continues regular eye/dental visits. Her weight is down 2 lbs since 08/2023; BMI is 38.9.  Wt Readings from Last 3 Encounters:  04/11/24 103 kg (226 lb 8 oz)  02/14/24 104 kg (229 lb)  01/25/24 104 kg (230 lb)   Her BP is controlled at goal of less than 130/80; managed w/ lifestyle alone.  BP Readings from Last 3 Encounters:  04/11/24 122/76  03/24/24 119/87  02/14/24 110/81   Her labs show:  Liver is normal. GGT normal. Mild vit d deficiency (26). Mildly increased cholesterol w/ LDL 131. FBG 97.  Diabetes is improved and controlled w/ A1c 5.9% (7% prior). B12 is deficient (194). Thyroid function is normal. Normal wbc, hemoglobin and platelets.  She continues on statin for control of cholesterol.  HISTORY: I have reviewed the patients problem list, current medications, allergies, and social history and updated them as needed.  Medical History[1] Family History[2]  Surgical History[3]  Current Medications[4]  [name]  reports that she has never smoked. She has never used smokeless tobacco.  ROS: The patient denies any fevers, chills, night sweats, headache, blurred vision, sore throat, coughing, shortness of breath, sputum production, chest pain, abdominal pain, nausea/vomiting/diarrhea, blood in the stool, dysuria, blood in urine, or leg swelling.   Review of Systems - All other systems reviewed are negative except as noted above.  OBJECTIVE:  [name]  height is 1.626 m (5' 4) and weight is 103 kg (226 lb 8 oz). Her blood pressure is 122/76 and her pulse is 76. Her oxygen saturation is 97%.   PHYSICAL:  General Appearance: White female w BMI 38.9. Alert and appears in no acute distress. Sitting comfortably in chair. HENT: Hearing intact. Neck: Supple, no carotid bruits. Respiratory: Good air movement throughout. No accessory muscle use, increased work of breathing or respiratory distress. Clear to auscultation in all lung fields. Normal effort of breathing, non-labored. No adventitious lung sounds including wheezes, rhochi or crackles. Cardiovascular: Regular rate and rhythm. Gastrointestinal: No CVAT bilaterally. Musculoskeletal: No pain with palpation of the spine or paraspinal muscles.  Extremities: No peripheral edema. 2+ radial pulse present.  Skin: Warm, dry. Psychiatric: Pleasant. Mood  and affect normal. Not anxious or tearful. Neurological: Alert.  Rosalva Catchings, PA-C Internal Medicine at [facility] 04/11/2024 5:02 PM   [facility]   - [facility] Medicare Wellness Visit Type:: Subsequent Annual Wellness Visit  Name: Casey Jordan Date of Birth: June 03, 1966 Age: 58 y.o. MRN: [id number] Visit Date: 04/11/2024  History obtained from: patient  Living Arrangements/Support System/Health Assessment/Pain/Stress Marital status: single Number of children: 3 Occupation: disabled Living arrangements: (!) lives alone Does the patient have a support system (family, friend, church, Conservation officer, nature, etc)?: Yes   Do you have any dental concerns?: No In the past month, have you experienced a change in your bladder control?: (!) Yes   Do you have any difficulty obtaining your medications?: No   Do you have trouble consistently taking or remembering to take all of your medications as prescribed?: No Patient rates overall stress level as: Some Does stress affect daily life?: (!) Yes Typical amount of pain: (!) a lot Does pain affect daily life?: (!) Yes Are you currently prescribed opioids?: No                Depression Screening  Behavioral Health Screening  Patient Health Questionnaire-2 Score: 0 (04/11/2024  2:38 PM)  Patient Health Questionnaire-9 Score: 0 (04/11/2024  2:38 PM)   PHQ Question # 9 Thoughts that you would be better off dead or hurting yourself in some way: Not at all    Patient's Depression screening/score = Negative    Depression Plan: Normal/Negative Screening    Social History (Tobacco/Drugs/Sexual Activity) [name] reports that she has never smoked. She has never used smokeless tobacco. Tobacco Use?: No How many times in the past year have you used a recreational drug or used a prescription medication for nonmedical reasons?: None Risk factors for sexually transmitted infections (i.e., multiple sexual partners): No Are you bothered by sexual problems?: No  Alcohol Screening How often do you have a drink containing alcohol?: Monthly or less How many standard drinks  containing alcohol do you have on a typical day?: Never, 1 or 2 drinks How often do you have six or more drinks on one occasion?: Never Audit-C Score: 1  Physical Activity Regular exercise?: Yes Exercise frequency (times per week): 4 Exercise intensity: moderate (like brisk walking)  Diet How many meals a day?: (!) 1 Eats fruit and vegetables daily?: Yes Most meals are obtained by: preparing their own meals  Home and Transportation Safety All rugs have non-skid backing?: (!) No All stairs or steps have railings?: N/A, no stairs Grab bars in the bathtub or shower?: (!) No Have non-skid surface in bathtub or shower?: Yes Good home lighting?: (!) No Regular seat belt use?: Yes      Activities of Daily Living Feed self?: Yes Bathe self?: Yes Dress self?: Yes Use toilet without assistance?: Yes Walk without assistance?: Yes    Instrumental Activities of Daily Living Manage finances?: Yes Shop for themselves?: Yes Prepare meals?: Yes Use the telephone?: Yes Manage medications?: Yes   Performs basic housework/laundry?: Yes Drives?: Yes Primary transportation is: driving  Hearing Concerns about hearing?: No Uses hearing aids?: No Hear whispered voice? (Observed): Yes  Vision Concerns about vision?: No Vision exam performed?: Yes  Fall Risk Is the patient ambulatory?: Yes One or more falls in the last year:: Yes Feels unsteady when walking:: No  Cognitive Assessment                  Advance Directives Living will?: ROLLEN)  No Advance directive information provided to patient: No Healthcare POA?: (!) No       Who is your in case of emergency contact?: Taylor,Triston Relationship to patient: Adult child Emergency contact's phone number: [phone]   Other History I reviewed and updated the following risk factors and conditions as appropriate: Reviewed/Updated: Problem List, Medical History, Surgical History, Family History, Medications,  Allergies Reviewed/Updated: Vital Signs (height, weight, and BP), Immunizations, Health Maintenance Patient Care Team Updated: Done  Vital Signs BP 122/76   Pulse 76   Ht 1.626 m (5' 4)   Wt 103 kg (226 lb 8 oz)   SpO2 97%   BMI 38.88 kg/m   Screening and Immunizations Health Maintenance Status       Date Due Completion Dates   Diabetes:  Quantitative uACR for Kidney Evaluation Never done ---   Diabetes: Foot Exam Never done ---   Pneumococcal Vaccine for Ages 50+ (1 of 2 - PCV) 01/29/1985 07/20/2010   Hepatitis B Vaccines (1 of 3 - 19+ 3-dose series) Never done ---   Hepatitis A Vaccines (1 of 2 - Risk 2-dose series) Never done ---   ZOSTER VACCINE (1 of 2) Never done ---   DTaP/Tdap/Td Vaccines (1 - Tdap) 12/22/2016 12/21/2016, 12/21/2016   Colorectal Cancer Screening 02/05/2024 02/04/2021, 02/04/2021   Influenza Vaccine (1) 02/18/2024 04/08/2023, 06/04/2022   Depression Monitoring 07/27/2024 01/25/2024   Diabetes:  eGFR for Kidney Evaluation 11/10/2024 11/11/2023, 01/12/2023   Diabetes: Hemoglobin A1C 02/13/2025 02/14/2024, 02/14/2024   Diabetes: Retinopathy Screening Combo 03/30/2025 03/31/2023, 03/31/2023   Comprehensive Annual Visit 04/11/2025 04/11/2024, 04/11/2024   Breast Cancer Screening (Mammogram) 12/26/2025 12/27/2023, 11/24/2022   Cervical Cancer Screening 11/18/2027 11/18/2022, 11/18/2022   Adult RSV (60+ Years or Pregnancy) (1 - 1-dose 75+ series) 01/29/2041 ---       Immunization History  Administered Date(s) Administered  . Influenza, Injectable, Quadrivalent, Preservative Free 08/11/2019, 04/05/2020, 04/29/2021, 06/04/2022  . Influenza, Unspecified 04/19/2018, 08/11/2019, 04/05/2020  . Pfizer COVID-19 12+yrs (aka Comirnaty) 04/08/2023  . Pfizer SARS-CoV-2 Bivalent 12+ yrs 07/19/2021  . Pfizer SARS-CoV-2 Primary Series 12+ yrs 02/07/2020, 03/23/2020  . Pneumococcal, Unspecified 07/20/2010  . TD (Adult), 2 Lf Tetanus Toxoid, Preservative Free 12/21/2016  . TD vaccine  (ADULT)PF(TENIVAC) 7Y+ 12/21/2016  . mdfluenza, MDCK, trivalent, PF 04/08/2023   Social Drivers of Health   Living Situation: Low Risk  (01/12/2024)   Living Situation   . What is your living situation today?: I have a steady place to live   . Think about the place you live. Do you have problems with any of the following? Choose all that apply:: None/None on this list  Food Insecurity: Low Risk  (10/26/2023)   Food vital sign   . Within the past 12 months, you worried that your food would run out before you got money to buy more: Never true   . Within the past 12 months, the food you bought just didn't last and you didn't have money to get more: Never true  Transportation Needs: No Transportation Needs (10/26/2023)   Transportation   . In the past 12 months, has lack of reliable transportation kept you from medical appointments, meetings, work or from getting things needed for daily living? : No  Utilities: Low Risk  (10/26/2023)   Utilities   . In the past 12 months has the electric, gas, oil, or water company threatened to shut off services in your home? : No  Alcohol Screening: Not At Risk (04/11/2024)   Alcohol   .  Audit C Alcohol risk score: 1  Tobacco Use: Low Risk  (04/11/2024)   Patient History   . Smoking Tobacco Use: Never   . Smokeless Tobacco Use: Never   . Passive Exposure: Not on file  Depression: Not at risk (04/11/2024)   PHQ-2   . PHQ-2 Score: 0   Assessment/Plan: Subsequent Annual Wellness Visit: The topics above were reviewed with the patient.  Healthy lifestyle principles reviewed.  Recommendations provided when indicated.  Follow up 1 year for next wellness visit.  No orders of the defined types were placed in this encounter.   New Medications Ordered This Visit  Medications  . atorvastatin (LIPITOR) 20 mg tablet    Sig: Take 1 tablet (20 mg total) by mouth daily. For cholesterol    Dispense:  90 tablet    Refill:  3    Patient Care Team: Cole Christopher  Podraza, PA-C as PCP - General (Physician Assistant) Cole Christopher Podraza, PA-C as PCP - Attributed Collier Stacia Mulch, RN as Registered Nurse  Electronically signed by: Rosalva Lonni Catchings, PA-C 04/11/2024 5:02 PM       [1] Past Medical History: Diagnosis Date  . Abnormal Pap smear of cervix   . Abnormal uterine bleeding (AUB)   . Anemia 03/14/2021  . Asthma (CMD)   . Back pain   . DDD (degenerative disc disease), lumbosacral   . Dizziness 07/05/2018   Onset December 2019.  MRI of the brain okay.  SABRA DVT (deep venous thrombosis)    (CMD) 03/14/2021   Formatting of this note might be different from the original. lower leg while traveling went to lung  . Fatty liver   . Fibroid   . Fx metatarsal 06/24/2012  . GAD (generalized anxiety disorder) 08/06/2017  . GERD (gastroesophageal reflux disease) 05/28/2017  . History of deep venous thrombosis (DVT) of distal vein of right lower extremity 12/21/2016   Overview:  Anticoagulation x 6 months; no recurrence.  Dx 2010  . History of fusion of cervical spine 12/21/2016   Overview:  2011-2012  . HPV (human papilloma virus) infection   . Hyperlipidemia   . Increased liver enzymes 03/14/2021  . Left ureteral calculus 11/18/2017  . Migraine   . Mild intermittent asthma without complication (CMD) 08/06/2017  . Mild renal insufficiency 03/14/2021  . Mixed hyperlipidemia 06/29/2017   10 year CVD risk 5%  . Nausea and vomiting 06/25/2018  . Obesity (BMI 30-39.9) 12/21/2016  . PONV (postoperative nausea and vomiting)   . Poor balance   . Restless leg syndrome 12/21/2016  . Stomach acid   . Thrombophlebitis arm 07/06/2018   06/2018: Noted on US  RUE  [2] Family History Problem Relation Name Age of Onset  . Macular degeneration Paternal Grandfather    . Melanoma Maternal Grandmother    . Throat cancer Maternal Grandmother    . COPD Maternal Grandmother    . Stroke Maternal Grandmother    . Cancer Maternal Grandmother    .  Migraines Maternal Grandmother    . Diabetes Father    . Heart disease Father    . Hyperlipidemia Father    . Hypertension Father    . Kidney disease Father    . Macular degeneration Father    . Glaucoma Father    . COPD Mother    . Diabetes Mother    . Heart disease Brother    . Coronary artery disease Brother    . COPD Sister    . Macular degeneration Paternal  Aunt    . Clotting disorder Neg Hx    . Allergic rhinitis Neg Hx    . Hearing loss Neg Hx    . Osteoporosis Neg Hx    . Colon cancer Neg Hx    . Colon polyps Neg Hx    . Breast cancer Neg Hx    [3] Past Surgical History: Procedure Laterality Date  . CERVICAL FUSION     Procedure: CERVICAL FUSION; 2011  . CHOLECYSTECTOMY     Procedure: CHOLECYSTECTOMY  . COLPOSCOPY     Procedure: COLPOSCOPY  . CYSTOSCOPY W/ STONE MANIPULATION Left 11/24/2017   Procedure: Cystoscopy W/ Stone Manipulation, Holmium, Stent;  Surgeon: Charlie Fallow Puschinsky, MD;  Location: [facility] MAIN OR;  Service: Urology;  Laterality: Left;  . DILATION AND CURETTAGE OF UTERUS     Procedure: DILATION AND CURETTAGE OF UTERUS  . DILATION AND CURETTAGE OF UTERUS N/A 06/17/2021   Procedure: HYSTEROSCOPY W/ DILATION  & CURETTAGE, MyoSure;  Surgeon: Heron Gaylan Prader, MD;  Location: [facility] MAIN OR;  Service: Gynecology;  Laterality: N/A;  MyoSure  . EPIDURAL BLOCK INJECTION Bilateral 10/28/2021   Procedure: LUMBAR MEDIAL BRANCH BLOCK L4-S1 bilateral #1/2;  Surgeon: Janus Von Adora Blanch, MD;  Location: [facility];  Service: Pain Services;  Laterality: Bilateral;  . EPIDURAL BLOCK INJECTION Bilateral 11/25/2021   Procedure: LUMBAR MEDIAL BRANCH BLOCK  L4-S1 BILATERAL #2/2;  Surgeon: Janus Von Adora Blanch, MD;  Location: [facility];  Service: Pain Services;  Laterality: Bilateral;  . EPIDURAL BLOCK INJECTION N/A 11/02/2023   INJECTION CERVICAL EPIDURAL STEROID C7-T1 dir to left performed by Janus Blanch, MD at [facility]  .  FRACTURE SURGERY     Procedure: FRACTURE SURGERY; RIGHT METATARSAL BRAKE - HAS TITANIUM ROD  . KIDNEY STONE SURGERY     Procedure: KIDNEY STONE SURGERY  . LITHOTRIPSY     Procedure: LITHOTRIPSY  . OOPHORECTOMY     Procedure: OOPHORECTOMY  . OVARY SURGERY Left    Procedure: OVARY SURGERY  . RADIOFREQUENCY ABLATION NERVES Left 12/23/2021   Procedure: LUMBAR RADIOFREQUENCY  L4-S1 LEFT #1/2;  Surgeon: Janus Von Adora Blanch, MD;  Location: [facility];  Service: Pain Services;  Laterality: Left;  . RADIOFREQUENCY ABLATION NERVES Right 01/06/2022   Procedure: LUMBAR RADIOFREQUENCY  L4-S1 RIGHT #2/2;  Surgeon: Janus Von Adora Blanch, MD;  Location: [facility];  Service: Pain Services;  Laterality: Right;  . RHIZOTOMY W/ RADIOFREQUENCY ABLATION Left 04/08/2023   RHIZOTOMY FACET RADIOFREQUENCY; Left L4-S1 RFA #1/2 (to be followed by right side thereafter) performed by Janus Blanch, MD at [facility]  . RHIZOTOMY W/ RADIOFREQUENCY ABLATION Right 05/04/2023   RHIZOTOMY FACET RADIOFREQUENCY  RIGHT LUMBAR L4-S1 #2/2 performed by Darwyn Blanch, MD at [facility]  . SACROILIAC JOINT INJECTION Bilateral 09/23/2021   Procedure: SACROILIAC JOINT INJECTION *bilateral;  Surgeon: Janus Von Adora Blanch, MD;  Location: [facility];  Service: Pain Services;  Laterality: Bilateral;  . SACROILIAC JOINT INJECTION Bilateral 08/07/2022   Procedure: SACROILIAC JOINT INJECTION:  Bilateral SI joint injections;  Surgeon: Blanch, Janus Von Adora, MD;  Location: [facility];  Service: Pain Services;  Laterality: Bilateral;  . SACROILIAC JOINT INJECTION Bilateral 01/08/2023   INJECTION/ASPIRATION JOINT SACROILLIAC:  Bilateral SI joint injections * performed by Janus Blanch, MD at [facility]  . SACROILIAC JOINT INJECTION Bilateral 07/06/2023   INJECTION/ASPIRATION JOINT SACROILLIAC BILAT SI JOINT performed by Darwyn Blanch, MD at [facility]  OR  . TONSILLECTOMY      Procedure: TONSILLECTOMY  [4] Current Outpatient Medications  Medication Sig Dispense Refill  . acetaminophen  (TYLENOL ) 325 mg tablet Take 650 mg by mouth every 6 (six) hours as needed.    . albuterol  HFA (PROVENTIL  HFA;VENTOLIN  HFA;PROAIR  HFA) 90 mcg/actuation inhaler INHALE 2 PUFFS EVERY 6 HOURS AS NEEDED FOR WHEEZING 25.5 each 1  . Arnuity Ellipta 200 mcg/actuation dsdv inhaler INHALE 1 PUFF DAILY 30 each 2  . aspirin  81 mg EC tablet TAKE 1 TABLET BY MOUTH EVERY DAY (Patient taking differently: Take 81 mg by mouth daily. PRN) 90 tablet 1  . baclofen (LIORESAL) 5 mg tablet TAKE 1 TABLET BY MOUTH THREE TIMES A DAY AS NEEDED FOR MUSCLE SPASMS 270 tablet 1  . cyanocobalamin, vitamin B-12, (Physicians EZ Use B-12) 1,000 mcg/mL kit Inject 1 mL (1,000 mcg total) into the muscle every 28 days. 3 kit 3  . dicyclomine  (Bentyl ) 20 mg tab TAKE 1 TABLET (20 MG TOTAL) BY MOUTH 4 TIMES DAILY BEFORE MEALS AND NIGHTLY AS NEEDED FOR ABDOMINAL PAIN 360 tablet 3  . ergocalciferol (VITAMIN D2) 1,250 mcg (50,000 unit) capsule Take 1 capsule (50,000 Units total) by mouth every 14 (fourteen) days. 6 capsule 3  . famotidine  (PEPCID ) 20 mg tablet TAKE 1 TABLET BY MOUTH TWICE A DAY 180 tablet 1  . gabapentin  (NEURONTIN ) 300 mg capsule Take 1 capsule (300 mg total) by mouth 3 (three) times a day. (Patient taking differently: Take 300 mg by mouth 3 (three) times a day. PRN) 90 capsule 1  . hydrOXYzine  (ATARAX ) 25 mg tablet Take 1-2 tablets (25-50 mg total) by mouth 3 (three) times a day as needed for anxiety. 60 tablet 0  . ipratropium-albuteroL  (DUO-NEB) 0.5-2.5 mg/3 mL nebulizer solution TAKE 3 ML BY NEBULIZATION EVERY 6 HOURS 360 mL 2  . lamoTRIgine  (LaMICtal ) 25 mg tablet Take 25 mg by mouth.    . lidocaine  (LIDODERM ) 5 % patch Apply 1 patch topically daily as needed for mild pain (1-3). Remove & discard patch within 12 hours or as directed by MD.    . meclizine  (ANTIVERT ) 25 mg tablet Take 25 mg by mouth 3 (three)  times a day as needed. 30 tablet 0  . montelukast (SINGULAIR) 10 mg tablet TAKE 1 TABLET BY MOUTH EVERYDAY AT BEDTIME 90 tablet 1  . MULTIVITAMIN WITH MINERALS ORAL Take 1 tablet by mouth daily.    . nebulizer accessories misc Use as directed. 1 each 0  . nebulizers misc Use as directed. 1 each 0  . pantoprazole  (PROTONIX ) 40 mg EC tablet TAKE 1 TABLET (40 MG TOTAL) BY MOUTH IN THE MORNING AND 1 TABLET (40 MG TOTAL) IN THE EVENING. TAKE BEFORE MEALS. 180 tablet 3  . promethazine  (PHENERGAN ) 25 mg tablet Take 1 tablet (25 mg total) by mouth every 6 (six) hours as needed for nausea. 20 tablet 2  . tirzepatide (Mounjaro) 7.5 mg/0.5 mL subcutaneous pen injector Inject 0.5 mL (7.5 mg total) under the skin every 7 days. 2 mL 2  . traZODone  (DESYREL ) 100 mg tablet Take 100 mg by mouth nightly as needed.    SABRA atorvastatin (LIPITOR) 20 mg tablet Take 1 tablet (20 mg total) by mouth daily. For cholesterol 90 tablet 3   No current facility-administered medications for this visit.

## 2024-04-12 NOTE — H&P (Signed)
 [facility] Center  PATIENT NAME: Casey Jordan PATIENT MRN: [id number] DATE OF SURGERY: 04/12/2024  Identification: Ms. Casey Jordan is a 58 y.o. year old female.   History of Present Illness Update from Previous Visit:  Reviewed, no changes from last visit  Past Medical History: Medical History[1]  Past Surgical History: Surgical History[2]  Family History: Family History[3]  Social History: Reviewed in in record  Allergies: is allergic to avocado, fluconazole , naproxen, nickel, ondansetron , penicillins, sulfamethoxazole, sulfamethoxazole-trimethoprim, and sulfasalazine.  Current Medications:  See outpatient medication list  Physical Exam:  AFVSS  Chest: unlabored breathing, equal chest expansion bilaterally, normal rate Cardiac: regular rate, no jugular venous distention or peripheral edema  Diagnosis: Acute on Chronic pain : Chronic buttock pain, sacroiliac joint pain, chronic low back pain   Impression Narrative:  Appropriate for procedure. The patient is appropriate for the procedure to be completed in the ambulatory surgery setting.  Patient Education - A description of the planned procedure was given to the patient.  The risks and benefits of the procedure were discussed with the patient and all questions were addressed to the patients satisfaction.  Risks and benefits were discussed at length with the patient. The risks included (but not only) increased pain following the procedure, failure of procedure and the fading of local anesthetic effect.  NPO guidelines were again d/w patient.  PLAN:  Continue with planned procedure. Patient is cleared for surgery/procedural intervention in an ambulatory setting.  Physician Attestation: Seen by Darwyn Blanch, MD        [1] Past Medical History: Diagnosis Date  . Abnormal Pap smear of cervix   . Abnormal uterine bleeding (AUB)   . Anemia 03/14/2021  . Asthma (CMD)    . Back pain   . DDD (degenerative disc disease), lumbosacral   . Dizziness 07/05/2018   Onset December 2019.  MRI of the brain okay.  SABRA DVT (deep venous thrombosis)    (CMD) 03/14/2021   Formatting of this note might be different from the original. lower leg while traveling went to lung  . Fatty liver   . Fibroid   . Fx metatarsal 06/24/2012  . GAD (generalized anxiety disorder) 08/06/2017  . GERD (gastroesophageal reflux disease) 05/28/2017  . History of deep venous thrombosis (DVT) of distal vein of right lower extremity 12/21/2016   Overview:  Anticoagulation x 6 months; no recurrence.  Dx 2010  . History of fusion of cervical spine 12/21/2016   Overview:  2011-2012  . HPV (human papilloma virus) infection   . Hyperlipidemia   . Increased liver enzymes 03/14/2021  . Left ureteral calculus 11/18/2017  . Migraine   . Mild intermittent asthma without complication (CMD) 08/06/2017  . Mild renal insufficiency 03/14/2021  . Mixed hyperlipidemia 06/29/2017   10 year CVD risk 5%  . Nausea and vomiting 06/25/2018  . Obesity (BMI 30-39.9) 12/21/2016  . PONV (postoperative nausea and vomiting)   . Poor balance   . Restless leg syndrome 12/21/2016  . Stomach acid   . Thrombophlebitis arm 07/06/2018   06/2018: Noted on US  RUE  [2] Past Surgical History: Procedure Laterality Date  . CERVICAL FUSION     Procedure: CERVICAL FUSION; 2011  . CHOLECYSTECTOMY     Procedure: CHOLECYSTECTOMY  . COLPOSCOPY     Procedure: COLPOSCOPY  . CYSTOSCOPY W/ STONE MANIPULATION Left 11/24/2017   Procedure: Cystoscopy W/ Stone Manipulation, Holmium, Stent;  Surgeon: Charlie Fallow Puschinsky, MD;  Location: [facility] MAIN OR;  Service: Urology;  Laterality: Left;  . DILATION AND CURETTAGE OF UTERUS     Procedure: DILATION AND CURETTAGE OF UTERUS  . DILATION AND CURETTAGE OF UTERUS N/A 06/17/2021   Procedure: HYSTEROSCOPY W/ DILATION  & CURETTAGE, MyoSure;  Surgeon: Heron Gaylan Prader, MD;  Location:  [facility] MAIN OR;  Service: Gynecology;  Laterality: N/A;  MyoSure  . EPIDURAL BLOCK INJECTION Bilateral 10/28/2021   Procedure: LUMBAR MEDIAL BRANCH BLOCK L4-S1 bilateral #1/2;  Surgeon: Janus Von Adora Blanch, MD;  Location: [facility];  Service: Pain Services;  Laterality: Bilateral;  . EPIDURAL BLOCK INJECTION Bilateral 11/25/2021   Procedure: LUMBAR MEDIAL BRANCH BLOCK  L4-S1 BILATERAL #2/2;  Surgeon: Janus Von Adora Blanch, MD;  Location: [facility];  Service: Pain Services;  Laterality: Bilateral;  . EPIDURAL BLOCK INJECTION N/A 11/02/2023   INJECTION CERVICAL EPIDURAL STEROID C7-T1 dir to left performed by Janus Blanch, MD at [facility]  . FRACTURE SURGERY     Procedure: FRACTURE SURGERY; RIGHT METATARSAL BRAKE - HAS TITANIUM ROD  . KIDNEY STONE SURGERY     Procedure: KIDNEY STONE SURGERY  . LITHOTRIPSY     Procedure: LITHOTRIPSY  . OOPHORECTOMY     Procedure: OOPHORECTOMY  . OVARY SURGERY Left    Procedure: OVARY SURGERY  . RADIOFREQUENCY ABLATION NERVES Left 12/23/2021   Procedure: LUMBAR RADIOFREQUENCY  L4-S1 LEFT #1/2;  Surgeon: Janus Von Adora Blanch, MD;  Location: [facility];  Service: Pain Services;  Laterality: Left;  . RADIOFREQUENCY ABLATION NERVES Right 01/06/2022   Procedure: LUMBAR RADIOFREQUENCY  L4-S1 RIGHT #2/2;  Surgeon: Janus Von Adora Blanch, MD;  Location: [facility];  Service: Pain Services;  Laterality: Right;  . RHIZOTOMY W/ RADIOFREQUENCY ABLATION Left 04/08/2023   RHIZOTOMY FACET RADIOFREQUENCY; Left L4-S1 RFA #1/2 (to be followed by right side thereafter) performed by Janus Blanch, MD at [facility] OR  . RHIZOTOMY W/ RADIOFREQUENCY ABLATION Right 05/04/2023   RHIZOTOMY FACET RADIOFREQUENCY  RIGHT LUMBAR L4-S1 #2/2 performed by Darwyn Blanch, MD at [facility] OR  . SACROILIAC JOINT INJECTION Bilateral 09/23/2021   Procedure: SACROILIAC JOINT INJECTION *bilateral;  Surgeon: Janus Von Adora Blanch, MD;   Location: [facility];  Service: Pain Services;  Laterality: Bilateral;  . SACROILIAC JOINT INJECTION Bilateral 08/07/2022   Procedure: SACROILIAC JOINT INJECTION:  Bilateral SI joint injections;  Surgeon: Blanch, Janus Von Adora, MD;  Location: [facility];  Service: Pain Services;  Laterality: Bilateral;  . SACROILIAC JOINT INJECTION Bilateral 01/08/2023   INJECTION/ASPIRATION JOINT SACROILLIAC:  Bilateral SI joint injections * performed by Janus Blanch, MD at [facility] OR  . SACROILIAC JOINT INJECTION Bilateral 07/06/2023   INJECTION/ASPIRATION JOINT SACROILLIAC BILAT SI JOINT performed by Darwyn Blanch, MD at [facility] OR  . TONSILLECTOMY     Procedure: TONSILLECTOMY  [3] Family History Problem Relation Name Age of Onset  . Macular degeneration Paternal Grandfather    . Melanoma Maternal Grandmother    . Throat cancer Maternal Grandmother    . COPD Maternal Grandmother    . Stroke Maternal Grandmother    . Cancer Maternal Grandmother    . Migraines Maternal Grandmother    . Diabetes Father    . Heart disease Father    . Hyperlipidemia Father    . Hypertension Father    . Kidney disease Father    . Macular degeneration Father    . Glaucoma Father    . COPD Mother    .  Diabetes Mother    . Heart disease Brother    . Coronary artery disease Brother    . COPD Sister    . Macular degeneration Paternal Aunt    . Clotting disorder Neg Hx    . Allergic rhinitis Neg Hx    . Hearing loss Neg Hx    . Osteoporosis Neg Hx    . Colon cancer Neg Hx    . Colon polyps Neg Hx    . Breast cancer Neg Hx

## 2024-04-17 ENCOUNTER — Other Ambulatory Visit: Payer: Self-pay

## 2024-04-17 ENCOUNTER — Encounter (HOSPITAL_BASED_OUTPATIENT_CLINIC_OR_DEPARTMENT_OTHER): Payer: Self-pay

## 2024-04-17 ENCOUNTER — Emergency Department (HOSPITAL_BASED_OUTPATIENT_CLINIC_OR_DEPARTMENT_OTHER)

## 2024-04-17 ENCOUNTER — Emergency Department (HOSPITAL_BASED_OUTPATIENT_CLINIC_OR_DEPARTMENT_OTHER): Admission: EM | Admit: 2024-04-17 | Discharge: 2024-04-17 | Disposition: A | Discharge status: Home / Self Care

## 2024-04-17 DIAGNOSIS — R0602 Shortness of breath: Secondary | ICD-10-CM | POA: Diagnosis not present

## 2024-04-17 DIAGNOSIS — R059 Cough, unspecified: Secondary | ICD-10-CM | POA: Insufficient documentation

## 2024-04-17 DIAGNOSIS — R000 Tachycardia, unspecified: Secondary | ICD-10-CM | POA: Diagnosis not present

## 2024-04-17 DIAGNOSIS — R002 Palpitations: Secondary | ICD-10-CM | POA: Insufficient documentation

## 2024-04-17 DIAGNOSIS — J45909 Unspecified asthma, uncomplicated: Secondary | ICD-10-CM | POA: Insufficient documentation

## 2024-04-17 LAB — RESP PANEL BY RT-PCR (RSV, FLU A&B, COVID)  RVPGX2
Influenza A by PCR: NEGATIVE
Influenza B by PCR: NEGATIVE
Resp Syncytial Virus by PCR: NEGATIVE
SARS Coronavirus 2 by RT PCR: NEGATIVE

## 2024-04-17 LAB — BASIC METABOLIC PANEL WITH GFR
Anion gap: 13 (ref 5–15)
BUN: 14 mg/dL (ref 6–20)
CO2: 22 mmol/L (ref 22–32)
Calcium: 9.6 mg/dL (ref 8.9–10.3)
Chloride: 102 mmol/L (ref 98–111)
Creatinine, Ser: 0.64 mg/dL (ref 0.44–1.00)
GFR, Estimated: >60 mL/min (ref >60)
Glucose, Bld: 83 mg/dL (ref 70–99)
Potassium: 3.8 mmol/L (ref 3.5–5.1)
Sodium: 138 mmol/L (ref 135–145)

## 2024-04-17 LAB — CBC
HCT: 38.4 % (ref 36.0–46.0)
Hemoglobin: 12.6 g/dL (ref 12.0–15.0)
MCH: 28.8 pg (ref 26.0–34.0)
MCHC: 32.8 g/dL (ref 30.0–36.0)
MCV: 87.7 fL (ref 80.0–100.0)
Platelets: 305 K/uL (ref 150–400)
RBC: 4.38 MIL/uL (ref 3.87–5.11)
RDW: 13.8 % (ref 11.5–15.5)
WBC: 10.3 K/uL (ref 4.0–10.5)
nRBC: 0 % (ref 0.0–0.2)

## 2024-04-17 LAB — TROPONIN T, HIGH SENSITIVITY: Troponin T High Sensitivity: <15 ng/L (ref 0–19)

## 2024-04-17 NOTE — ED Triage Notes (Signed)
 Reports palpitations, productive cough, SHOB, dizziness, pulsating feeling in neck into head since this afternoon.   Dyspnea w exertion Denies chest pain, N/V, fevers

## 2024-04-17 NOTE — Discharge Instructions (Addendum)
 It was a pleasure taking care of you here today  As we discussed in the room is your labs and imaging did not show any significant findings.  I make sure to follow back up with your primary care provider.  Would possibly benefit from zio patch for your palpitations or cardiology evaluation if your symptoms do not improve

## 2024-04-17 NOTE — ED Provider Notes (Signed)
 Care assumed from previous provider.  See note for full HPI.  In summation 58 year old whose had ongoing palpitations, myalgias, cough, DOE, dizziness.  Thought initially due to cholesterol medications.  Patient difficult IV stick.  Has had 2 straight sticks with labs that were hemolyzed.  Plan on follow-up on remaining labs.  If negative can DC home. Physical Exam  BP 106/66   Pulse 81   Temp 98 F (36.7 C) (Oral)   Resp 20   LMP 03/21/2017 (Exact Date)   SpO2 95%   Physical Exam Vitals and nursing note reviewed.  Constitutional:      General: She is not in acute distress.    Appearance: She is well-developed. She is not ill-appearing.  HENT:     Head: Normocephalic and atraumatic.     Mouth/Throat:     Mouth: Mucous membranes are moist.  Eyes:     Pupils: Pupils are equal, round, and reactive to light.  Neck:     Vascular: No carotid bruit or JVD.     Trachea: Trachea and phonation normal.     Comments: No bruit Cardiovascular:     Rate and Rhythm: Normal rate.     Pulses: Normal pulses.     Heart sounds: Normal heart sounds.  Pulmonary:     Effort: Pulmonary effort is normal. No respiratory distress.     Breath sounds: Normal breath sounds.  Abdominal:     General: Bowel sounds are normal. There is no distension.     Palpations: Abdomen is soft.  Musculoskeletal:        General: Normal range of motion.     Cervical back: Full passive range of motion without pain and normal range of motion.     Right lower leg: No tenderness. No edema.     Left lower leg: No tenderness. No edema.     Comments: Soft, nontender.  Skin:    General: Skin is warm and dry.  Neurological:     General: No focal deficit present.     Mental Status: She is alert.  Psychiatric:        Mood and Affect: Mood normal.    Procedures  Procedures Labs Reviewed  RESP PANEL BY RT-PCR (RSV, FLU A&B, COVID)  RVPGX2  CBC  BASIC METABOLIC PANEL WITH GFR  TROPONIN T, HIGH SENSITIVITY   DG Chest 2  View Result Date: 04/17/2024 CLINICAL DATA:  Shortness of breath.  Palpitation. EXAM: CHEST - 2 VIEW COMPARISON:  Chest [place]  dated 10/29/2023 FINDINGS: No focal consolidation, pleural effusion, pneumothorax. The cardiac silhouette is within normal limits. No acute osseous pathology. Lower cervical ACDF. IMPRESSION: No active cardiopulmonary disease. Electronically Signed   By: Vanetta Chou M.D.   On: 04/17/2024 18:36    ED Course / MDM   Clinical Course as of 04/17/24 2320  Mon Apr 17, 2024  2157 FU on repeat BMP- dc if BMP and trop wnl [BH]    Clinical Course User Index [BH] Kristel Durkee A, PA-C   In summation 58 year old whose had ongoing palpitations, myalgias, cough, DOE, dizziness.  Thought initially due to cholesterol medications.  Patient difficult IV stick.  Has had 2 straight sticks with labs that were hemolyzed.  Plan on follow-up on remaining labs.  If negative can DC home.  Labs and imaging personally viewed interpreted No significant abnormality  Patient reassessed.  Sounds like her symptoms have been going on for quite some time.  She feels frustrated that she has not had any  answers.  I encouraged her to follow back up with her primary care provider.  Possibly benefit from Zio patch given her ongoing palpitations to r/o with me.  She does have some dyspnea on exertion.  She states she has never had a stress test before.  Positive benefit from cardiology eval for stress test.  At this time reassuring workup.  However follow-up outpatient, return for any worsening symptoms.  The patient has been appropriately medically screened and/or stabilized in the ED. I have low suspicion for any other emergent medical condition which would require further screening, evaluation or treatment in the ED or require inpatient management.  Patient is hemodynamically stable and in no acute distress.  Patient able to ambulate in department prior to ED.  Evaluation does not show acute  pathology that would require ongoing or additional emergent interventions while in the emergency department or further inpatient treatment.  I have discussed the diagnosis with the patient and answered all questions.  Pain is been managed while in the emergency department and patient has no further complaints prior to discharge.  Patient is comfortable with plan discussed in room and is stable for discharge at this time.  I have discussed strict return precautions for returning to the emergency department.  Patient was encouraged to follow-up with PCP/specialist refer to at discharge.     Medical Decision Making Amount and/or Complexity of Data Reviewed External Data Reviewed: labs, radiology, ECG and notes. Labs: ordered. Decision-making details documented in ED Course. Radiology: ordered and independent interpretation performed. Decision-making details documented in ED Course. ECG/medicine tests: ordered and independent interpretation performed. Decision-making details documented in ED Course.  Risk OTC drugs. Decision regarding hospitalization. Diagnosis or treatment significantly limited by social determinants of health.          Seryna Marek A, PA-C 04/17/24 2320    Simon Lavonia SAILOR, MD 04/17/24 [phone]

## 2024-04-17 NOTE — ED Provider Notes (Signed)
 [facility] Provider Note   CSN: [id number] Arrival date & time: 04/17/24  1740     Patient presents with: Shortness of Breath and Palpitations   Casey Jordan is a 58 y.o. female whose primary concern is that she feels a pulsating sensation in the bilateral anterior neck, also has had productive cough, some subjective dyspnea, occasional episodes of dizziness.  She denies any chest pain, denies any nausea or vomiting, denies having any body aches, fevers, or chills.  Review of progress note from her primary care shows that she is currently under pain management for SI joint dysfunction, has diagnosis of GERD.  Has further history of DVT, asthma, GAD.    Shortness of Breath Palpitations Associated symptoms: shortness of breath        Prior to Admission medications   Medication Sig Start Date End Date Taking? Authorizing Provider  acetaminophen  (TYLENOL ) 325 MG tablet Take 2 tablets (650 mg total) by mouth every 6 (six) hours as needed. 01/06/23   Elnor Jayson LABOR, DO  albuterol  (VENTOLIN  HFA) 108 (90 Base) MCG/ACT inhaler Inhale 2 puffs into the lungs every 6 (six) hours as needed for wheezing or shortness of breath. 03/06/20   Danton Jon HERO, PA-C  ALPRAZolam (XANAX) 0.5 MG tablet Take 0.5 mg by mouth as needed for anxiety (flying). 08/31/22   [provider]  Baclofen 5 MG TABS Take 5 mg by mouth 3 (three) times daily as needed (muscle spasms). 01/02/22   [provider]  cyanocobalamin (VITAMIN B12) 1000 MCG/ML injection Inject 1,000 mcg into the muscle every 28 (twenty-eight) days. 11/10/22   [provider]  dicyclomine  (BENTYL ) 20 MG tablet Take 1 tablet (20 mg total) by mouth 2 (two) times daily. 09/05/21   Palumbo, April, MD  famotidine  (PEPCID ) 20 MG tablet Take 1 tablet (20 mg total) by mouth 2 (two) times daily. Needs OV for additional refills. 01/28/21   Newlin, Enobong, MD  fluticasone  (FLOVENT  HFA) 110  MCG/ACT inhaler Inhale 1 puff into the lungs as needed (shortness of breath). 07/28/21   [provider]  gabapentin  (NEURONTIN ) 300 MG capsule Take 300 mg by mouth 3 (three) times daily as needed (Nerve pain).    [provider]  lamoTRIgine  (LAMICTAL ) 25 MG tablet TAKE 2 TABLETS BY MOUTH EVERY DAY 10/15/23   Geralene Kaiser, MD  lidocaine  (LIDODERM ) 5 % Place 1 patch onto the skin as needed (back pain). 06/28/22   [provider]  LORazepam  (ATIVAN ) 0.5 MG tablet Take 1 tablet (0.5 mg total) by mouth daily. Patient taking differently: Take 0.5 mg by mouth daily as needed for anxiety. 09/03/21   Harl Zane BRAVO, NP  meclizine  (ANTIVERT ) 25 MG tablet Take 1 tablet (25 mg total) by mouth 3 (three) times daily as needed for dizziness. 01/28/21   Newlin, Enobong, MD  Multiple Vitamin (MULTIVITAMIN WITH MINERALS) TABS tablet Take 1 tablet by mouth daily.    [provider]  omeprazole  (PRILOSEC) 40 MG capsule Take 1 capsule (40 mg total) by mouth 2 (two) times daily. 01/28/21   Newlin, Enobong, MD  promethazine  (PHENERGAN ) 25 MG tablet Take 1 tablet (25 mg total) by mouth every 6 (six) hours as needed for nausea or vomiting. 01/04/23   Rojelio Nest, DO  rosuvastatin (CRESTOR) 20 MG tablet Take 20 mg by mouth daily.    [provider]  sertraline  (ZOLOFT ) 100 MG tablet Take 1 tablet (100 mg total) by mouth daily. 09/13/23  Geralene Kaiser, MD  sertraline  (ZOLOFT ) 25 MG tablet TAKE 1 TABLET (25 MG TOTAL) BY MOUTH DAILY. 12/20/23 12/19/24  Geralene Kaiser, MD  traZODone  (DESYREL ) 50 MG tablet Take 1 tablet (50 mg total) by mouth at bedtime. 09/07/23   Geralene Kaiser, MD    Allergies: Codeine , Naproxen, Avocado, Sulfa antibiotics, Meloxicam, Sulfamethoxazole, Sulfasalazine, Fluconazole , Nickel, Ondansetron , Penicillin g, Penicillins, and Sulfamethoxazole-trimethoprim    Review of Systems  Respiratory:  Positive for shortness of breath.   Cardiovascular:  Positive for  palpitations.  All other systems reviewed and are negative.   Updated Vital Signs BP (!) 136/95 (BP Location: Left Arm)   Pulse (!) 111   Temp 98 F (36.7 C) (Oral)   Resp 20   LMP 03/21/2017 (Exact Date)   SpO2 100%   Physical Exam Vitals and nursing note reviewed.  Constitutional:      General: She is not in acute distress.    Appearance: Normal appearance. She is well-developed.  HENT:     Head: Normocephalic and atraumatic.     Right Ear: Tympanic membrane, ear canal and external ear normal.     Left Ear: Tympanic membrane, ear canal and external ear normal.     Mouth/Throat:     Lips: Pink.     Mouth: Mucous membranes are moist.     Pharynx: Oropharynx is clear. Uvula midline.  Eyes:     Extraocular Movements: Extraocular movements intact.     Conjunctiva/sclera: Conjunctivae normal.     Pupils: Pupils are equal, round, and reactive to light.  Neck:     Thyroid: No thyromegaly or thyroid tenderness.     Vascular: No carotid bruit, hepatojugular reflux or JVD.     Trachea: Trachea and phonation normal. No tracheal deviation.  Cardiovascular:     Rate and Rhythm: Regular rhythm. Tachycardia present.     Pulses: Normal pulses.     Heart sounds: Normal heart sounds. No murmur heard.    No friction rub. No gallop.  Pulmonary:     Effort: Pulmonary effort is normal.     Breath sounds: Normal breath sounds.  Abdominal:     General: Abdomen is flat. Bowel sounds are normal.     Palpations: Abdomen is soft.  Musculoskeletal:        General: Normal range of motion.     Cervical back: Full passive range of motion without pain, normal range of motion and neck supple. No edema or rigidity. No pain with movement.     Right lower leg: No edema.     Left lower leg: No edema.  Lymphadenopathy:     Cervical: No cervical adenopathy.  Skin:    General: Skin is warm and dry.     Capillary Refill: Capillary refill takes less than 2 seconds.  Neurological:     General: No focal  deficit present.     Mental Status: She is alert and oriented to person, place, and time. Mental status is at baseline.  Psychiatric:        Attention and Perception: Attention and perception normal.        Mood and Affect: Mood and affect normal.        Speech: Speech normal.        Behavior: Behavior normal. Behavior is cooperative.        Thought Content: Thought content normal.        Cognition and Memory: Cognition normal.        Judgment: Judgment normal.     (  all labs ordered are listed, but only abnormal results are displayed) Labs Reviewed  RESP PANEL BY RT-PCR (RSV, FLU A&B, COVID)  RVPGX2  CBC  BASIC METABOLIC PANEL WITH GFR  TROPONIN T, HIGH SENSITIVITY    EKG: None  Radiology: DG Chest 2 View Result Date: 04/17/2024 CLINICAL DATA:  Shortness of breath.  Palpitation. EXAM: CHEST - 2 VIEW COMPARISON:  Chest [place]  dated 10/29/2023 FINDINGS: No focal consolidation, pleural effusion, pneumothorax. The cardiac silhouette is within normal limits. No acute osseous pathology. Lower cervical ACDF. IMPRESSION: No active cardiopulmonary disease. Electronically Signed   By: Vanetta Chou M.D.   On: 04/17/2024 18:36     Procedures   Medications Ordered in the ED - No data to display                                  Medical Decision Making Amount and/or Complexity of Data Reviewed Labs: ordered. Radiology: ordered.   Medical Decision Making:   LAVRA IMLER is a 58 y.o. female who presented to the ED today with palpitations and pulsating sensation in the anterior neck detailed above.     Complete initial physical exam performed, notably the patient  was alert and oriented in no apparent distress.  Physical exam was largely unremarkable, there is no carotid bruit, no anterior neck lymphadenopathy.    Reviewed and confirmed nursing documentation for past medical history, family history, social history.    Initial Assessment:   With the patient's presentation  of palpitations and pulsating sensation in her neck along with shortness of breath, consider possible viral illness, also consider ACS or other cardiac etiology.  Initial Plan:  To rule out viral etiology, obtain nasopharyngeal swab. Screening labs including CBC and Metabolic panel to evaluate for infectious or metabolic etiology of disease.  CXR to evaluate for structural/infectious intrathoracic pathology.  EKG to evaluate for cardiac pathology Objective evaluation as below reviewed   Initial Study Results:   Laboratory  All laboratory results reviewed without evidence of clinically relevant pathology.   Exceptions include: Extensive portion of lab evaluation pending at time of care handoff.  Troponin and BMP with GFR pending as they have had multiple issues with lab draws.  EKG EKG was reviewed independently. Rate, rhythm, axis, intervals all examined and without medically relevant abnormality. ST segments without concerns for elevations.    Radiology:  All images reviewed independently. Agree with radiology report at this time.   DG Chest 2 View Result Date: 04/17/2024 CLINICAL DATA:  Shortness of breath.  Palpitation. EXAM: CHEST - 2 VIEW COMPARISON:  Chest [place]  dated 10/29/2023 FINDINGS: No focal consolidation, pleural effusion, pneumothorax. The cardiac silhouette is within normal limits. No acute osseous pathology. Lower cervical ACDF. IMPRESSION: No active cardiopulmonary disease. Electronically Signed   By: Vanetta Chou M.D.   On: 04/17/2024 18:36      Reassessment and Plan:   At time of care handoff, workup is still ongoing.  Physical exam was largely unremarkable and reassuring.  Respiratory panel is negative for COVID/flu/RSV.  There is no signs of leukocytosis, and remainder of CBC is within normal limits.  Disposition pending results of BMP and troponin, do note that EKG is unremarkable.  Chest x-ray does not show any acute cardiopulmonary disease.  Care signed  out to B. Henderly, PA-C       Final diagnoses:  None    ED Discharge Orders  None          Myriam Dorn BROCKS, [place] 04/17/24 2157    Simon Lavonia SAILOR, MD 04/17/24 [phone]
